# Patient Record
Sex: Female | Born: 1935 | Race: White | Hispanic: No | Smoking: Never smoker
Health system: Southern US, Community
[De-identification: ages and names within clinical notes are randomized; demographics above are authoritative.]

## PROBLEM LIST (undated history)

## (undated) DIAGNOSIS — K3184 Gastroparesis: Secondary | ICD-10-CM

## (undated) DIAGNOSIS — M797 Fibromyalgia: Secondary | ICD-10-CM

## (undated) DIAGNOSIS — K219 Gastro-esophageal reflux disease without esophagitis: Secondary | ICD-10-CM

## (undated) DIAGNOSIS — R0602 Shortness of breath: Secondary | ICD-10-CM

## (undated) DIAGNOSIS — R112 Nausea with vomiting, unspecified: Secondary | ICD-10-CM

## (undated) DIAGNOSIS — D369 Benign neoplasm, unspecified site: Secondary | ICD-10-CM

## (undated) DIAGNOSIS — C801 Malignant (primary) neoplasm, unspecified: Secondary | ICD-10-CM

## (undated) DIAGNOSIS — T7840XA Allergy, unspecified, initial encounter: Secondary | ICD-10-CM

## (undated) DIAGNOSIS — K449 Diaphragmatic hernia without obstruction or gangrene: Secondary | ICD-10-CM

## (undated) DIAGNOSIS — H269 Unspecified cataract: Secondary | ICD-10-CM

## (undated) DIAGNOSIS — K589 Irritable bowel syndrome without diarrhea: Secondary | ICD-10-CM

## (undated) DIAGNOSIS — Z9889 Other specified postprocedural states: Secondary | ICD-10-CM

## (undated) DIAGNOSIS — K635 Polyp of colon: Secondary | ICD-10-CM

## (undated) DIAGNOSIS — E039 Hypothyroidism, unspecified: Secondary | ICD-10-CM

## (undated) DIAGNOSIS — D509 Iron deficiency anemia, unspecified: Secondary | ICD-10-CM

## (undated) DIAGNOSIS — K222 Esophageal obstruction: Secondary | ICD-10-CM

## (undated) HISTORY — DX: Gastroparesis: K31.84

## (undated) HISTORY — DX: Polyp of colon: K63.5

## (undated) HISTORY — PX: EYE SURGERY: SHX253

## (undated) HISTORY — PX: APPENDECTOMY: SHX54

## (undated) HISTORY — DX: Iron deficiency anemia, unspecified: D50.9

## (undated) HISTORY — DX: Benign neoplasm, unspecified site: D36.9

## (undated) HISTORY — DX: Allergy, unspecified, initial encounter: T78.40XA

## (undated) HISTORY — PX: DILATION AND CURETTAGE OF UTERUS: SHX78

## (undated) HISTORY — PX: COLON SURGERY: SHX602

## (undated) HISTORY — DX: Irritable bowel syndrome, unspecified: K58.9

## (undated) HISTORY — DX: Fibromyalgia: M79.7

## (undated) HISTORY — PX: ARM SKIN LESION BIOPSY / EXCISION: SUR471

## (undated) HISTORY — DX: Gastro-esophageal reflux disease without esophagitis: K21.9

## (undated) HISTORY — DX: Diaphragmatic hernia without obstruction or gangrene: K44.9

## (undated) HISTORY — DX: Hypothyroidism, unspecified: E03.9

## (undated) HISTORY — DX: Esophageal obstruction: K22.2

## (undated) HISTORY — PX: OTHER SURGICAL HISTORY: SHX169

## (undated) HISTORY — PX: ABDOMINAL HYSTERECTOMY: SHX81

## (undated) HISTORY — DX: Unspecified cataract: H26.9

---

## 2002-05-26 ENCOUNTER — Ambulatory Visit (HOSPITAL_COMMUNITY): Admission: RE | Admit: 2002-05-26 | Discharge: 2002-05-26 | Payer: Self-pay | Admitting: Family Medicine

## 2002-05-26 ENCOUNTER — Encounter: Payer: Self-pay | Admitting: Family Medicine

## 2002-09-25 ENCOUNTER — Ambulatory Visit (HOSPITAL_COMMUNITY): Admission: RE | Admit: 2002-09-25 | Discharge: 2002-09-25 | Payer: Self-pay | Admitting: Internal Medicine

## 2002-09-25 HISTORY — PX: ESOPHAGOGASTRODUODENOSCOPY: SHX1529

## 2004-01-28 ENCOUNTER — Ambulatory Visit (HOSPITAL_COMMUNITY): Admission: RE | Admit: 2004-01-28 | Discharge: 2004-01-28 | Payer: Self-pay | Admitting: Internal Medicine

## 2004-01-28 HISTORY — PX: ESOPHAGOGASTRODUODENOSCOPY: SHX1529

## 2004-04-20 ENCOUNTER — Ambulatory Visit: Payer: Self-pay | Admitting: Gastroenterology

## 2004-06-16 ENCOUNTER — Ambulatory Visit (HOSPITAL_COMMUNITY): Admission: RE | Admit: 2004-06-16 | Discharge: 2004-06-16 | Payer: Self-pay | Admitting: Family Medicine

## 2004-08-17 ENCOUNTER — Ambulatory Visit: Payer: Self-pay | Admitting: Internal Medicine

## 2004-08-18 ENCOUNTER — Ambulatory Visit (HOSPITAL_COMMUNITY): Admission: RE | Admit: 2004-08-18 | Discharge: 2004-08-18 | Payer: Self-pay | Admitting: Internal Medicine

## 2004-08-18 IMAGING — RF DG ESOPHAGUS
15 of 21 series · 15 of 21 positions shown · non-contrast
Comparison: none

CLINICAL DATA: Dysphagia.

ESOPHAGRAM:
TECHNIQUE: Routine esophagram was performed under fluoroscopy.

[Series 1: run · 1 of 1 slices shown (1 of 15)]
[im 1/1]
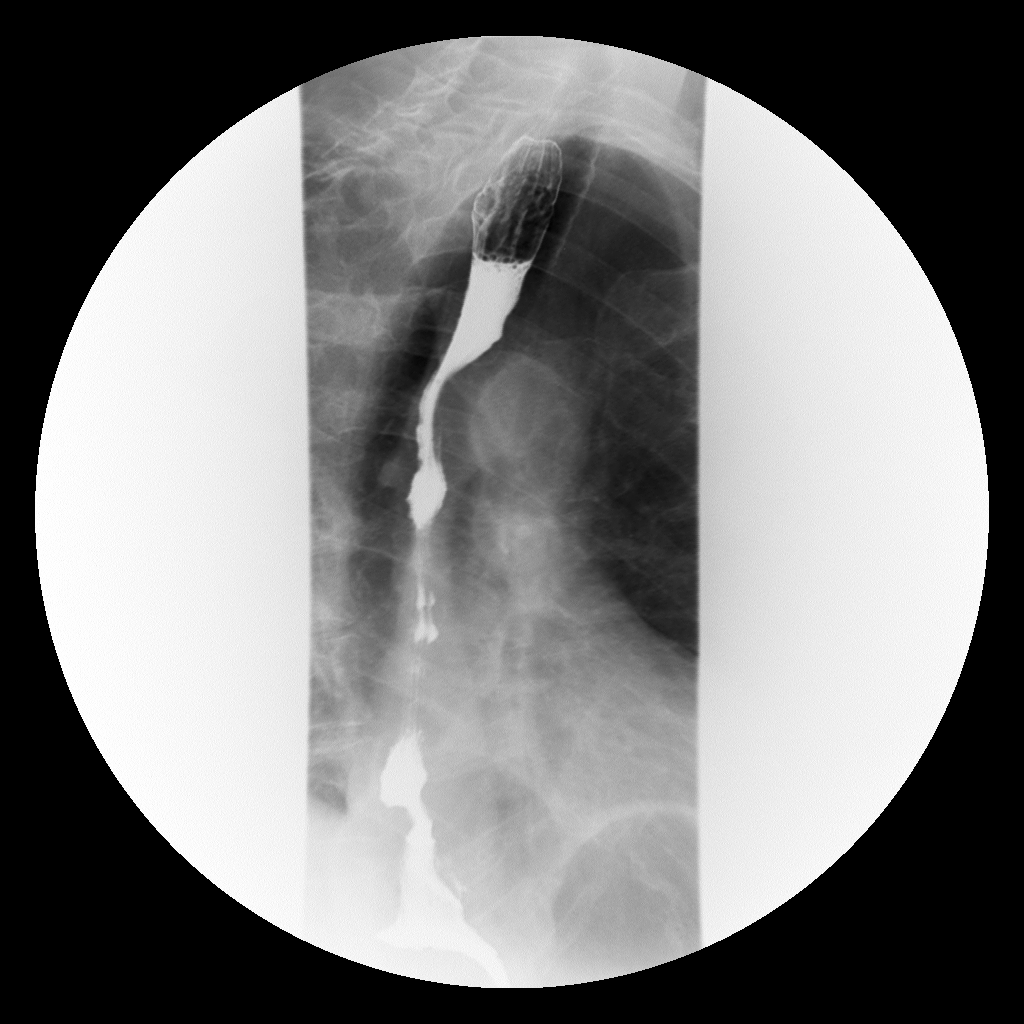

[Series 3: run · 1 of 1 slices shown (2 of 15)]
[im 1/1]
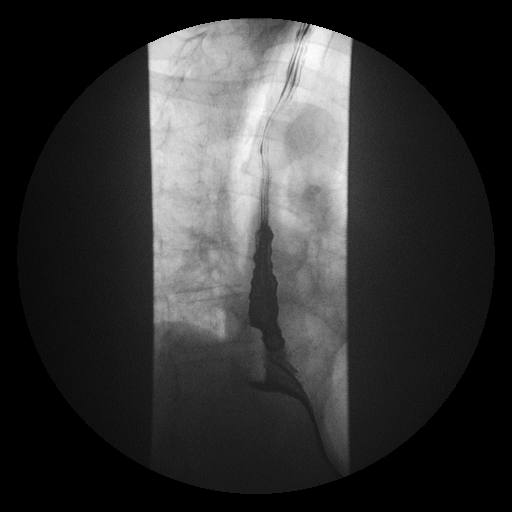

[Series 4: run · 1 of 1 slices shown (3 of 15)]
[im 1/1]
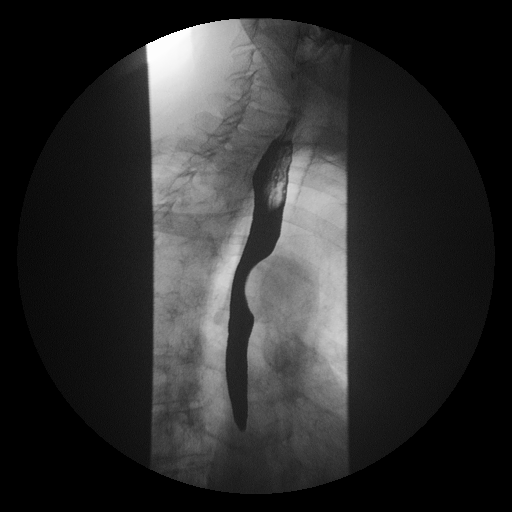

[Series 5: run · 1 of 1 slices shown (4 of 15)]
[im 1/1]
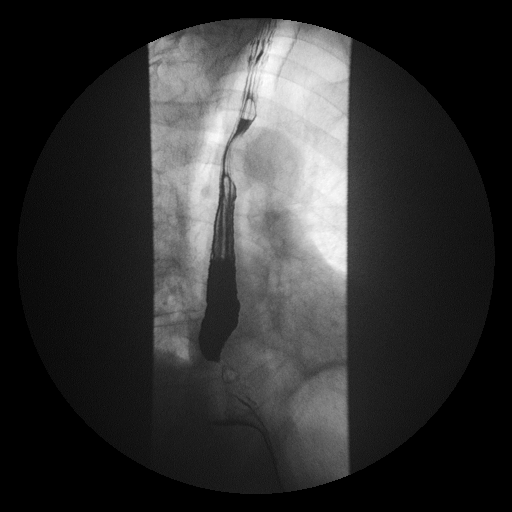

[Series 7: run · 1 of 1 slices shown (5 of 15)]
[im 1/1]
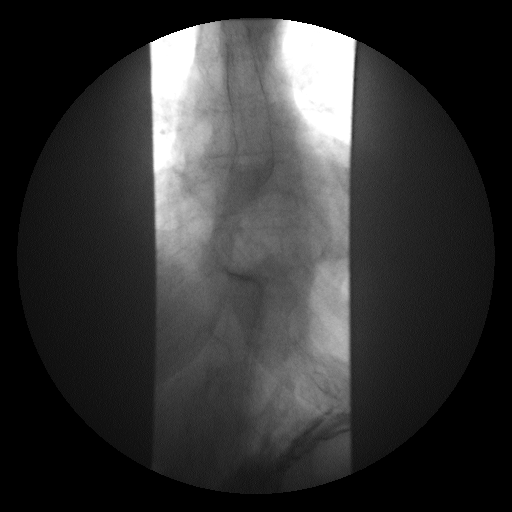

[Series 8: run · 1 of 1 slices shown (6 of 15)]
[im 1/1]
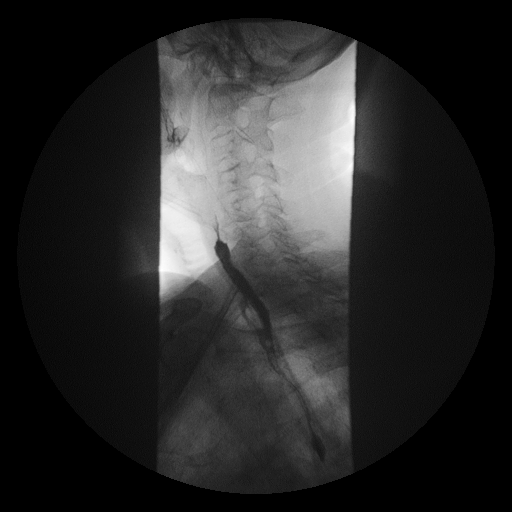

[Series 10: run · 1 of 1 slices shown (7 of 15)]
[im 1/1]
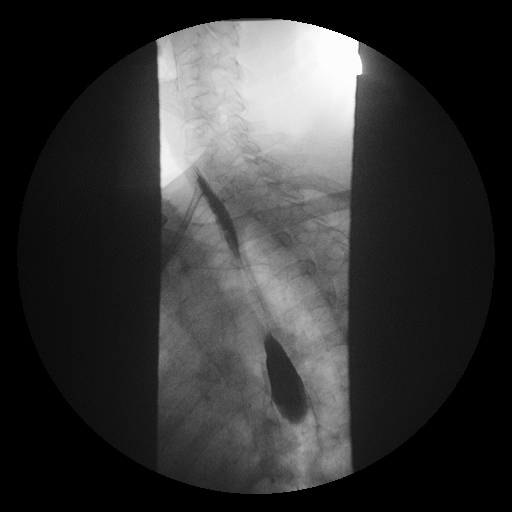

[Series 11: run · 1 of 1 slices shown (8 of 15)]
[im 1/1]
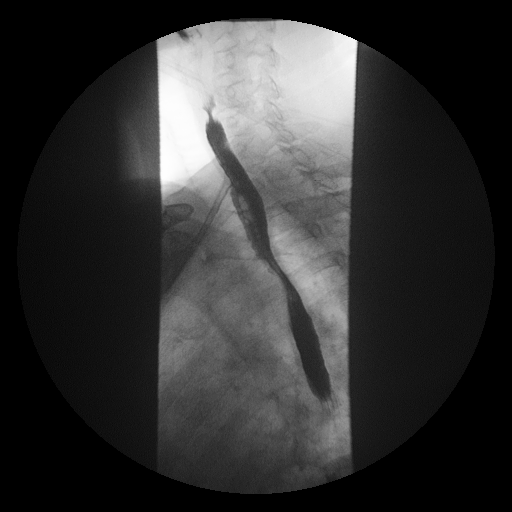

[Series 12: run · 1 of 1 slices shown (9 of 15)]
[im 1/1]
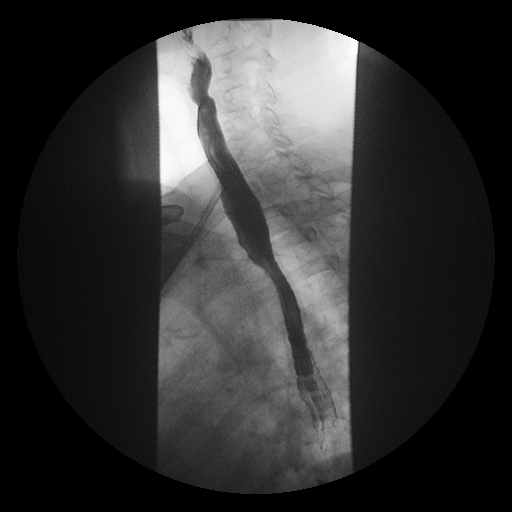

[Series 14: run · 1 of 1 slices shown (10 of 15)]
[im 1/1]
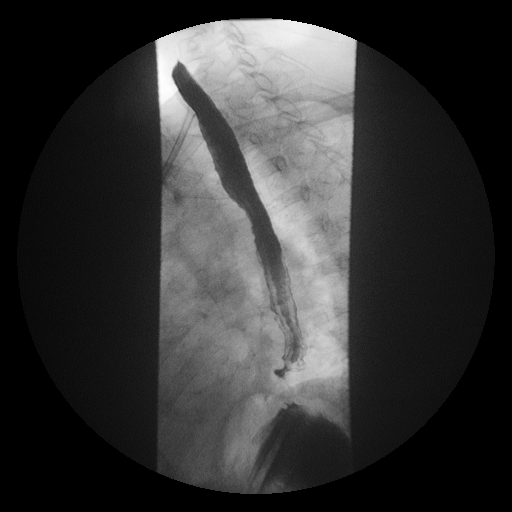

[Series 15: run · 1 of 1 slices shown (11 of 15)]
[im 1/1]
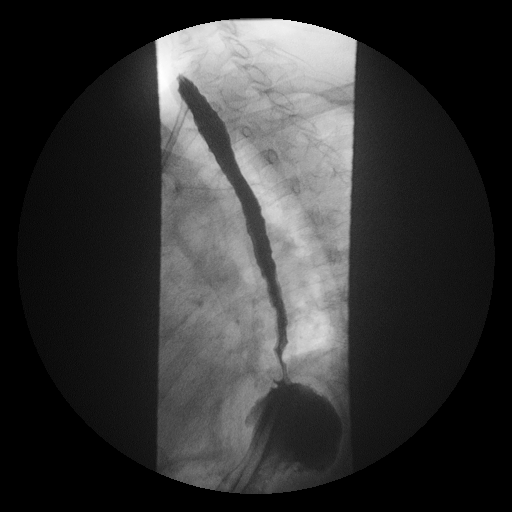

[Series 17: run · 1 of 1 slices shown (12 of 15)]
[im 1/1]
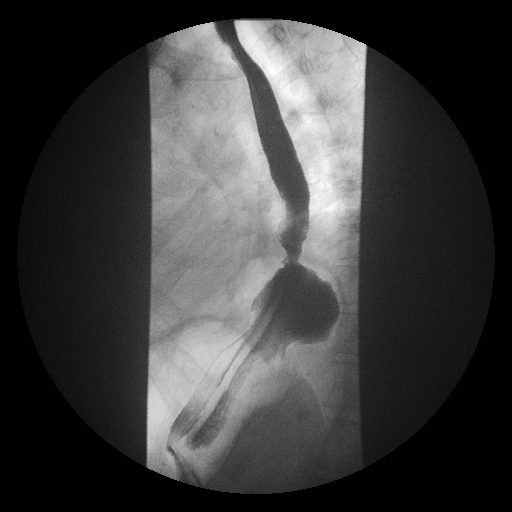

[Series 18: run · 1 of 1 slices shown (13 of 15)]
[im 1/1]
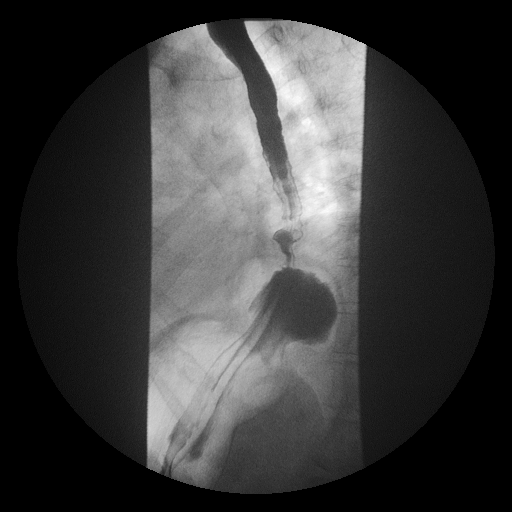

[Series 19: run · 1 of 1 slices shown (14 of 15)]
[im 1/1]
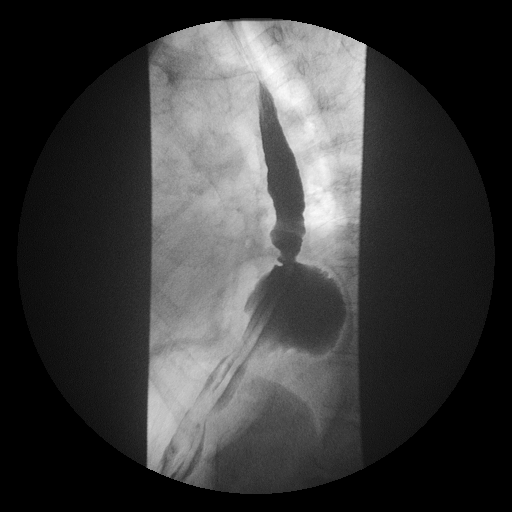

[Series 21: run · 1 of 1 slices shown (15 of 15)]
[im 1/1]
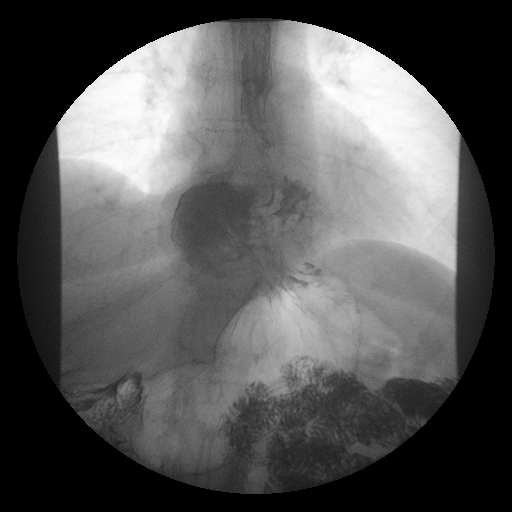

[15 of 21 positions shown; findings below may reference images not displayed]

FINDINGS: The examination was limited the fact that the patient would only
swallowing small amount of barium at a time. Normal primary and secondary
esophageal peristalsis with mild intermittent tertiary peristaltic activity in
the distal esophagus. No hypopharyngeal abnormalities seen. Moderate sized
sliding hiatal hernia. Mild, smooth narrowing at the gastroesophageal junction.
The patient did not drink enough barium to evaluate for gastroesophageal reflux.
There was reflux of air witnessed during the examination when the patient was in
a left posterior oblique position, supine on the table. No masses or ulcerations
seen. The patient swallowed a 12.5 mm in diameter barium tablet without
difficulty. This initially lodged at the gastroesophageal junction. This quickly
passed into the stomach with additional swallows of water.
IMPRESSION: 1. Moderate sized sliding hiatal hernia. 

2. Short segment, benign stricture at the gastroesophageal junction. The barium
tablet initially lodged at this location and subsequently passed normally into
the stomach. 

3. Mild intermittent tertiary peristaltic activity in the distal esophagus. 

4. Limited examination due to the fact that the patient would only swallow a
small amount of barium at a time.

## 2004-09-01 ENCOUNTER — Ambulatory Visit (HOSPITAL_COMMUNITY): Admission: RE | Admit: 2004-09-01 | Discharge: 2004-09-01 | Payer: Self-pay | Admitting: Internal Medicine

## 2004-09-01 ENCOUNTER — Ambulatory Visit: Payer: Self-pay | Admitting: Internal Medicine

## 2004-09-01 DIAGNOSIS — K449 Diaphragmatic hernia without obstruction or gangrene: Secondary | ICD-10-CM

## 2004-09-01 HISTORY — DX: Diaphragmatic hernia without obstruction or gangrene: K44.9

## 2004-09-01 HISTORY — PX: COLONOSCOPY: SHX174

## 2004-09-01 HISTORY — PX: ESOPHAGOGASTRODUODENOSCOPY: SHX1529

## 2004-09-21 ENCOUNTER — Ambulatory Visit: Payer: Self-pay | Admitting: Internal Medicine

## 2005-03-27 ENCOUNTER — Ambulatory Visit: Payer: Self-pay | Admitting: Internal Medicine

## 2005-08-17 ENCOUNTER — Ambulatory Visit: Payer: Self-pay | Admitting: Cardiology

## 2005-11-15 ENCOUNTER — Ambulatory Visit: Payer: Self-pay | Admitting: Internal Medicine

## 2005-12-14 ENCOUNTER — Ambulatory Visit (HOSPITAL_COMMUNITY): Admission: RE | Admit: 2005-12-14 | Discharge: 2005-12-14 | Payer: Self-pay | Admitting: Internal Medicine

## 2005-12-14 ENCOUNTER — Ambulatory Visit: Payer: Self-pay | Admitting: Internal Medicine

## 2005-12-14 HISTORY — PX: ESOPHAGOGASTRODUODENOSCOPY: SHX1529

## 2006-06-07 ENCOUNTER — Ambulatory Visit: Payer: Self-pay | Admitting: Endocrinology

## 2006-07-08 ENCOUNTER — Ambulatory Visit (HOSPITAL_COMMUNITY): Admission: RE | Admit: 2006-07-08 | Discharge: 2006-07-08 | Payer: Self-pay | Admitting: *Deleted

## 2006-07-09 ENCOUNTER — Ambulatory Visit (HOSPITAL_COMMUNITY): Admission: RE | Admit: 2006-07-09 | Discharge: 2006-07-09 | Payer: Self-pay | Admitting: *Deleted

## 2006-12-31 ENCOUNTER — Ambulatory Visit: Payer: Self-pay | Admitting: Internal Medicine

## 2007-01-03 HISTORY — PX: ESOPHAGOGASTRODUODENOSCOPY: SHX1529

## 2007-01-20 ENCOUNTER — Ambulatory Visit (HOSPITAL_COMMUNITY): Admission: RE | Admit: 2007-01-20 | Discharge: 2007-01-20 | Payer: Self-pay | Admitting: Internal Medicine

## 2007-01-20 ENCOUNTER — Ambulatory Visit: Payer: Self-pay | Admitting: Internal Medicine

## 2007-03-08 ENCOUNTER — Encounter: Payer: Self-pay | Admitting: *Deleted

## 2007-03-08 DIAGNOSIS — E039 Hypothyroidism, unspecified: Secondary | ICD-10-CM | POA: Insufficient documentation

## 2007-03-08 DIAGNOSIS — K219 Gastro-esophageal reflux disease without esophagitis: Secondary | ICD-10-CM | POA: Insufficient documentation

## 2007-03-08 DIAGNOSIS — J45909 Unspecified asthma, uncomplicated: Secondary | ICD-10-CM | POA: Insufficient documentation

## 2007-03-08 DIAGNOSIS — J309 Allergic rhinitis, unspecified: Secondary | ICD-10-CM | POA: Insufficient documentation

## 2008-04-02 ENCOUNTER — Ambulatory Visit (HOSPITAL_COMMUNITY): Admission: RE | Admit: 2008-04-02 | Discharge: 2008-04-02 | Payer: Self-pay | Admitting: Family Medicine

## 2008-07-22 ENCOUNTER — Ambulatory Visit: Payer: Self-pay | Admitting: Internal Medicine

## 2008-08-02 ENCOUNTER — Encounter (HOSPITAL_COMMUNITY): Admission: RE | Admit: 2008-08-02 | Discharge: 2008-09-01 | Payer: Self-pay | Admitting: Internal Medicine

## 2008-08-02 DIAGNOSIS — K3184 Gastroparesis: Secondary | ICD-10-CM

## 2008-08-02 HISTORY — DX: Gastroparesis: K31.84

## 2008-09-20 DIAGNOSIS — K3184 Gastroparesis: Secondary | ICD-10-CM

## 2008-09-20 DIAGNOSIS — R51 Headache: Secondary | ICD-10-CM

## 2008-09-20 DIAGNOSIS — R197 Diarrhea, unspecified: Secondary | ICD-10-CM

## 2008-09-20 DIAGNOSIS — R110 Nausea: Secondary | ICD-10-CM

## 2008-09-20 DIAGNOSIS — R1310 Dysphagia, unspecified: Secondary | ICD-10-CM

## 2008-09-20 DIAGNOSIS — R519 Headache, unspecified: Secondary | ICD-10-CM | POA: Insufficient documentation

## 2008-09-20 DIAGNOSIS — Z8719 Personal history of other diseases of the digestive system: Secondary | ICD-10-CM | POA: Insufficient documentation

## 2008-09-20 DIAGNOSIS — R498 Other voice and resonance disorders: Secondary | ICD-10-CM | POA: Insufficient documentation

## 2008-09-20 DIAGNOSIS — R1013 Epigastric pain: Secondary | ICD-10-CM

## 2008-10-14 ENCOUNTER — Ambulatory Visit: Payer: Self-pay | Admitting: Internal Medicine

## 2009-05-03 ENCOUNTER — Ambulatory Visit (HOSPITAL_COMMUNITY): Admission: RE | Admit: 2009-05-03 | Discharge: 2009-05-03 | Payer: Self-pay | Admitting: Family Medicine

## 2009-10-17 ENCOUNTER — Ambulatory Visit (HOSPITAL_COMMUNITY): Admission: RE | Admit: 2009-10-17 | Discharge: 2009-10-17 | Payer: Self-pay | Admitting: Ophthalmology

## 2010-04-05 ENCOUNTER — Ambulatory Visit: Payer: Self-pay | Admitting: Internal Medicine

## 2010-04-05 DIAGNOSIS — K449 Diaphragmatic hernia without obstruction or gangrene: Secondary | ICD-10-CM | POA: Insufficient documentation

## 2010-04-18 ENCOUNTER — Ambulatory Visit (HOSPITAL_COMMUNITY): Admission: RE | Admit: 2010-04-18 | Discharge: 2010-04-18 | Payer: Self-pay | Admitting: Internal Medicine

## 2010-04-18 ENCOUNTER — Ambulatory Visit: Payer: Self-pay | Admitting: Internal Medicine

## 2010-04-18 HISTORY — PX: ESOPHAGOGASTRODUODENOSCOPY: SHX1529

## 2010-07-03 ENCOUNTER — Other Ambulatory Visit: Payer: Self-pay

## 2010-07-03 DIAGNOSIS — Z139 Encounter for screening, unspecified: Secondary | ICD-10-CM

## 2010-07-04 ENCOUNTER — Ambulatory Visit (HOSPITAL_COMMUNITY): Admission: RE | Admit: 2010-07-04 | Payer: Self-pay | Source: Home / Self Care | Admitting: Family Medicine

## 2010-07-06 NOTE — Assessment & Plan Note (Signed)
Summary: DIFFICULTY SWALLOWING,SEVERE HEARTBURN,CONSULT FOR EGD/SS   Visit Type:  f/u  Chief Complaint:  difficulty swallowing.  History of Present Illness: Sabrina Morrison is here for f/u. She c/o recurrent dysphagia and severe heartburn. She was last seen in 5/10. At that time she had GES which showed slight delay in gastric emptying. She was tried on EES but shortly after developed left-sided weakness so the med was stopped. She was given domperidone but not clear of her response.  On Prevacid chronically. Recent abx for URI. C/O hoarseness, sinus swelling, coughing. Recent chest tightness. Difficulty swallowing for several months, especially meats. Sometimes pills get stuck. Feels like food is always sitting her her chest. Sleeps on pillow wedge due to nocturnal reflux. Lots of heartburn pp. Stool tests recently normal per patient. BM 3-4 small stools daily. No melena, brbpr. [facility] earlier this year with acute diarrhea. Health Dept involved but patient not sure source of her diarrhea, just knows it was food-borne.   Current Medications (verified): 1)  Synthroid 100 Mcg  Tabs (Levothyroxine Sodium) .... Take 1 By Mouth Qd 2)  [employer] Vitamin B-12 500 Mcg  Tabs (Cyanocobalamin) .... Take 1 By Mouth Qd 3)  Rolaids .... Take As Needed 4)  Ibuprofen Tabs .... Take As Needed 5)  Calcium 600 Mg Tabs (Calcium) .... Take 1 Tablet By Mouth Two Times A Day 6)  Fish Oil 1000 Mg Caps (Omega-3 Fatty Acids) .... Take 1 Tablet By Mouth Once A Day 7)  Ginger Root 550 Mg Caps (Ginger (Zingiber Officinalis)) .... Take 1 Capsule By Mouth Once A Day 8)  Acetaminophen .... Take As Needed 9)  Xopenex .... As Needed 10)  Maxair .... As Directed 11)  Lomotil 2.5-0.025 Mg Tabs (Diphenoxylate-Atropine) .... As Needed 12)  Prevacid 30 Mg Cpdr (Lansoprazole) .... Once Daily  Allergies (verified): 1)  ! Codeine 2)  ! Sulfa 3)  ! Nexium 4)  ! Avelox (Moxifloxacin Hcl) 5)  ! E.e.s. 400 (Erythromycin  Ethylsuccinate) 6)  ! Omeprazole (Omeprazole) 7)  ! Dexilant (Dexlansoprazole)  Past History:  Family History: Last updated: 10-15-2008 Father: deceased 59  MI Mother: deceased 84 heart failure Siblings: one half sister  Social History: Last updated: 10/15/08 Marital Status: widowed Children: 2 daughters Occupation: Publishing rights manager [employer]  Past Medical History: Allergic rhinitis Asthma GERD Hypothyroidism Mild gastroparesis EGD with Savary dilation,8/08,-->cork-screwed esophagus, prominent Schatki's ring, large hh  TCS, 3/06--> normal   Past Surgical History: Segmental colectomy with incidental appendectomy in [place], [place] for diverticulitis Hysterectomy D&C  Review of Systems General:  Denies fever, chills, sweats, anorexia, fatigue, weakness, and weight loss. Eyes:  Denies vision loss. ENT:  Complains of nasal congestion, hoarseness, and difficulty swallowing; denies sore throat. CV:  Denies chest pains, angina, palpitations, dyspnea on exertion, and peripheral edema. Resp:  Complains of wheezing; denies dyspnea at rest, dyspnea with exercise, cough, and sputum. GI:  See HPI. GU:  Denies urinary burning and blood in urine. MS:  Denies joint swelling and low back pain. Derm:  Denies rash and itching. Neuro:  Denies weakness, frequent headaches, memory loss, and confusion. Psych:  Denies depression and anxiety. Endo:  Denies unusual weight change. Heme:  Denies bruising and bleeding. Allergy:  Denies hives and rash.  Vital Signs:  Patient profile:   77 year old female Height:      65 inches Weight:      173 pounds BMI:     28.89 Temp:     97.9 degrees F oral Pulse rate:  68 / minute BP sitting:   132 / 80  (left arm) Cuff size:   regular  Vitals Entered By: Hendricks Limes LPN (April 05, 2010 2:50 PM)  Physical Exam  General:  Well developed, well nourished, no acute distress. Head:  Normocephalic and atraumatic. Eyes:  sclera nonicteric Mouth:   Oropharyngeal mucosa moist, pink.  No lesions, erythema or exudate.    Neck:  Supple; no masses or thyromegaly. Lungs:  Clear throughout to auscultation. Heart:  Regular rate and rhythm; no murmurs, rubs,  or bruits. Abdomen:  Mild epig tenderness. Normal BS. Soft. No HSM or masses. No abd bruit or hernia. Extremities:  No clubbing, cyanosis, edema or deformities noted. Neurologic:  Alert and  oriented x4;  grossly normal neurologically. Skin:  Intact without significant lesions or rashes. Cervical Nodes:  No significant cervical adenopathy. Psych:  Alert and cooperative. Normal mood and affect.  Impression & Recommendations:  Problem # 1:  OTHER DYSPHAGIA (ICD-787.29)  Recurrent dysphagia with h/o Schatki's rings requiring multiple dilations in the past. Last EGD/ED in 2008. Suspect recurrent ring. EGD/ED to be performed in near future.  Risks, alternatives, benefits including but not limited to risk of reaction to medications, bleeding, infection, and perforation addressed.  Patient voiced understanding and verbal consent obtained.   Patient reports having significant N/V for 2-3 days after last EGD. She request RX for antiemetic post-op and may consider antiemetic prior to sedation.   Orders: Est. Patient Level IV (82956)  Problem # 2:  GERD (ICD-530.81)  Significant pp and nocturnal reflux on Prevacid. Intolerant to most other PPIs. Seen at [facility] several years ago for consideration of anti-reflux surgery but pH study had poor correlation with symptoms and it was felt the probe may had migrated to the stomach causing the high acidic readings. Medical therapy was recommended instead. Mild gastroparesis, previously did not tolerate EES and was given RX for domperidone (but not sure of response). EGD to be performed in near future.  Risks, alternatives, benefits including but not limited to risk of reaction to medications, bleeding, infection, and perforation addressed.  Patient voiced  understanding and verbal consent obtained.   Orders: Est. Patient Level IV (21308)  Problem # 3:  HIATAL HERNIA (ICD-553.3)  H/O large hiatal hernia which is likely source of some of her symptoms of fullness in chest and reflux.   Orders: Est. Patient Level IV (65784)

## 2010-07-06 NOTE — Letter (Signed)
Summary: EGD/ED ORDER  EGD/ED ORDER   Imported By: Ave Filter 04/05/2010 15:38:07  _____________________________________________________________________  External Attachment:    Type:   Image     Comment:   External Document

## 2010-07-08 ENCOUNTER — Encounter: Payer: Self-pay | Admitting: Family Medicine

## 2010-07-17 ENCOUNTER — Ambulatory Visit (HOSPITAL_COMMUNITY)
Admission: RE | Admit: 2010-07-17 | Discharge: 2010-07-17 | Disposition: A | Discharge status: Home / Self Care | Payer: Medicare Other | Source: Ambulatory Visit | Attending: Family Medicine | Admitting: Family Medicine

## 2010-07-17 DIAGNOSIS — Z139 Encounter for screening, unspecified: Secondary | ICD-10-CM

## 2010-07-17 DIAGNOSIS — Z1231 Encounter for screening mammogram for malignant neoplasm of breast: Secondary | ICD-10-CM | POA: Insufficient documentation

## 2010-08-22 LAB — BASIC METABOLIC PANEL
Chloride: 105 mEq/L (ref 96–112)
GFR calc Af Amer: >60 mL/min (ref >60)
GFR calc non Af Amer: >60 mL/min (ref >60)
Potassium: 4.6 mEq/L (ref 3.5–5.1)
Sodium: 138 mEq/L (ref 135–145)

## 2010-08-22 LAB — HEMOGLOBIN AND HEMATOCRIT, BLOOD
HCT: 36.1 % (ref 36.0–46.0)
Hemoglobin: 12.4 g/dL (ref 12.0–15.0)

## 2010-10-17 NOTE — Op Note (Signed)
NAME:  DELANCEY, MORAES               ACCOUNT NO.:  [id number]   MEDICAL RECORD NO.:  [id number]          PATIENT TYPE:  AMB   LOCATION:  DAY                           FACILITY:  [facility]   PHYSICIAN:  R. Roetta Sessions, M.D. DATE OF BIRTH:  29-Dec-1930   DATE OF PROCEDURE:  01/20/2007  DATE OF DISCHARGE:                               OPERATIVE REPORT   PROCEDURE:  EGD with Savary dilation followed by biopsy, disruption of  Schatzki's ring.   INDICATIONS FOR PROCEDURE:  A 77 year old lady with recurrent esophageal  dysphagia secondary to __________  Schatzki's ring.  She has been over  to  [facility], they had really no additional ideas other than  periodic dilation.  She has recurrent dysphagia.  EGD is now being done  with plans for esophageal dilation. The potential risks, benefits and  alternatives have been reviewed, questions answered.  She is agreeable.  Please see documentation of the medical record.   PROCEDURE NOTE:  O2 saturation, blood pressure, pulse and respirations  were monitored throughout the entire procedure.   CONSCIOUS SEDATION:  Versed 6 mg IV, Demerol 75 mg IV, Cetacaine spray  for topical pharyngeal anesthesia.   INSTRUMENT:  Pentax video chip system.   FINDINGS:  Examination of the tubular esophagus revealed some  corkscrewing ring of the tubular esophagus.  The EG junction was  somewhat offset. A ring was seen but it was not a straight shot into the  EG junction. The mucosa otherwise appeared normal.   STOMACH:  The gastric cavity was empty and insufflated well with air.  A  thorough examination of the gastric mucosa including a retroflexed view  of the proximal stomach revealed a large hiatal hernia, otherwise the  mucosa appeared normal. Schatzki's ring could be well seen and  retroflexed. The pylorus patent, easily traversed. Examination of the  bulb and second portion revealed no abnormalities.   THERAPEUTIC/DIAGNOSTIC MANEUVERS PERFORMED:  I felt  it was no longer  safe to attempt blind passage of a Maloney dilators and subsequently I  got the savory guidewire advanced through the scope down into the antrum  and removed the scope under endoscopic control maintaining position of  the wire. Over the wire,  I railed an 18-mm savory over the tapered tip,  got to the oropharynx and I ran into acute resistance and the patient  required additional sedation a this time, she was biting the savory. I  also realized the guidewire had migrated out. I withdrew everything and  reintubated the stomach with the scope and advanced the wire into the  antrum and pulled it out under endoscopic control maintaining the wire  in the antrum.  Subsequently after she was sedated additionally was able  to pass sn 18-mm Savory with fairly good tolerance although she was  biting the [place] intermittently as it was very carefully passed. A look  back revealed no apparent complication related to the dilator.  There  was a small tear in the ring, seen best retroflexed from the stomach.  However it more or less remained intact.  Subsequently I utilized the  jumbo biopsy forceps and took two bites of the ring at opposite  locations with excellent disruption without apparent complication.  The  patient tolerated the procedure well and was reacted in endoscopy.   IMPRESSION:  Cork screwed esophagus, prominent Schatzki's ring status  post dilation and disruption as described above, large hiatal hernia,  otherwise normal stomach, D1, D2.   RECOMMENDATIONS:  1. Continue omeprazole 20 mg orally twice daily.  2. She is to let me know if she has any recurrent swallowing      difficulties. Tentatively plan to see her back in the office in 1      year.      Jonathon Bellows, M.D.  Electronically Signed     RMR/MEDQ  D:  01/20/2007  T:  01/20/2007  Job:  [id number]   cc:   Donzetta Sprung  Fax: [phone]

## 2010-10-17 NOTE — Assessment & Plan Note (Signed)
NAMEMarland Morrison  NAKESHIA, Morrison                CHART#:  [id number]   DATE:  07/22/2008                       DOB:  1930/09/11   HISTORY:  1. Gastroesophageal reflux disease.  2. Schatzki's ring.  3. Normal colonoscopy 2006.  4. IBS-D.   The patient has had a long history of refractory gastroesophageal reflux  disease.  Her Schatzki ring was last dilated in 2008 at the time of EGD.  She has done well with no dysphagia.  However, she continues to have  regurgitation after each meal.  She switched from omeprazole to Kapidex  60 mg orally daily for the past 2 months with some better control of her  reflux symptoms, although she does have regurgitation after each meal  and takes some Maalox and a GI cocktail daily, sometimes multiple times.  She has a known large hiatal hernia, seeing Dr. Lorin Picket over at [facility]  previously, but has held off on proceeding with repair of hiatal  hernia/antireflux surgery.  She has lost 8 pounds since she was last  here.  She asked if she needed to have another EGD.  She is not had any  melena or rectal bleeding.   The patient stated she had some nausea, vomiting, diarrhea following the  procedure and called here but never got response back.  I do not see any  documentation of that in her record however.   The patient tells me that she has seen Dr. Lucie Leather and he prescribed  Avelox.  She has had some asthma episodes in association with her  regurgitation and I wonder if there is a connection.   CURRENT MEDICATIONS:  See updated list.   ALLERGIES:  Sulfa, codeine, Nexium, Avelox.   PHYSICAL EXAMINATION:  Today she looks very well.  Weight 173, height 5  foot 5 inches, temperature 97.8, BP 158/80, pulse 60.  SKIN:  Warm and dry.  CHEST:  Lungs are clear to auscultation.  HEART:  Regular rate and rhythm without murmur, gallop or rub.  ABDOMEN:  Flat, positive bowel sounds, soft, nontender without  appreciable mass or organomegaly.   IMPRESSION:  This patient  is a pleasant 77 year old lady with  longstanding gastroesophageal reflux disease in the setting of a large  hiatal hernia.  She is describing postprandial regurgitation and she is  taking not only Kapidex but Maalox and a GI cocktail daily.  I do not  see where she has ever had a gastric emptying study.  Before doing any  anything else, I think we should look into the pathophysiology of what  is going on little further and assess gastric emptying before proceeding  in the direction of prokinetic therapy versus baclofen to bolster the  lower esophageal sphincter pressure.  At this point, we will go with a  gastric emptying study and size things up and then I will make further  recommendations.  She may end up at some point with impedance/pH study.  Telephone follow-up once I have that GES in hand for review.       Jonathon Bellows, M.D.  Electronically Signed     RMR/MEDQ  D:  07/22/2008  T:  07/22/2008  Job:  [id number]   cc:   Donzetta Sprung

## 2010-10-17 NOTE — H&P (Signed)
NAME:  Sabrina Morrison, Sabrina Morrison               ACCOUNT NO.:  [id number]   MEDICAL RECORD NO.:  [id number]          PATIENT TYPE:  AMB   LOCATION:  DAY                           FACILITY:  [facility]   PHYSICIAN:  R. Roetta Sessions, M.D. DATE OF BIRTH:  May 07, 1931   DATE OF ADMISSION:  DATE OF DISCHARGE:  LH                              HISTORY & PHYSICAL   CHIEF COMPLAINT:  Recurrent esophageal dysphagia.  History of  recalcitrant Schatzki's ring .   HISTORY OF PRESENT ILLNESS:  [name] is a pleasant 77 year old  woman with a well-known, well-documented Schatzki's ring, last dilated  with a 56-French Maloney dilator on December 15, 2006.  She was doing well  until recently when she developed recurrent esophageal dysphagia.  She  had been dilated multiple times and went in to see Dr. Alvester Morin over at [facility] to see if he had anything else to  offer, but he did not.  He recommended periodic dilations and acid  suppression therapy.  She has been on Prevacid 30 mg orally daily, which  works as well as any of the other multiple PPIs she has taken  previously.  She has not been on Zegerid.  She does take Rolaids p.r.n.  and occasionally takes a GI cocktail for night time symptoms.  Has not  had any melena or rectal bleeding.   PAST MEDICAL HISTORY:  1. Hypothyroidism on supplementation.  2. History of nasal allergies.  3. Gastroesophageal reflux disease.  4. History of segmental colectomy in [place], [place] for      diverticulitis previously.  5. History of hiatal hernia.  6. Hysterectomy.  7. D&C.   CURRENT MEDICATIONS:  1. Detrol LA 4 mg daily.  2. Lovenox p.r.n.  3. Synthroid 120 mcg daily.  4. Omeprazole 20 mg orally b.i.d.  5. Rolaids p.r.n.  6. Nitrofurantoin p.r.n.   ALLERGIES:  1. SULFA.  2. CODEINE.  3. NEXIUM.   FAMILY HISTORY:  Negative for chronic GI or liver disease.  The patient  is widowed.  Her husband was a former Psychologist, counselling.  She  has  2 daughters.  She is retired.  No tobacco or alcohol abuse.   REVIEW OF SYSTEMS:  No chest pain, no dyspnea on exertion.  Weight is  down 4 pounds.   PHYSICAL EXAMINATION:  GENERAL:  A pleasant 77 year old lady resting  comfortably.  VITAL SIGNS:  Weight 169.  Height 5 feet, 5 inches.  Temperature 97.6,  blood pressure 150/80, pulse 64.  CHEST:  Lungs are clear to auscultation.  CARDIAC:  Occasional ectopic beat with a 2/6 systolic ejection murmur in  the left sternal border.  ABDOMEN:  Flat, positive bowel sounds, soft, nontender, without  appreciable mass or organomegaly.  EXTREMITIES:  Without edema.   IMPRESSION:  [name] is a pleasant 77 year old lady with  recurrent esophageal dysphagia in the setting of a well-documented  Schatzki's ring.  This ring is somewhat recalcitrant and requires  relatively frequent dilations.  The best approach in this setting is to  offer her another dilation at this  point in time.  The potential risks  and benefits of the procedure have been reviewed and questions answered.  She is agreeable.  Will plan to perform EGD and esophageal dilation as  the most appropriate in the near future.  Further recommendations to  follow.   ADDENDUM:  She was dilated up to a 58-French Mahoney dilator back on  September 01, 2004.      Jonathon Bellows, M.D.  Electronically Signed     RMR/MEDQ  D:  12/31/2006  T:  01/01/2007  Job:  [id number]   cc:   Donzetta Sprung  Fax: [phone]

## 2010-10-20 NOTE — Op Note (Signed)
NAME:  Sabrina Morrison, Sabrina Morrison                         ACCOUNT NO.:  [id number]   MEDICAL RECORD NO.:  [id number]                   PATIENT TYPE:  AMB   LOCATION:  DAY                                  FACILITY:  [facility]   PHYSICIAN:  R. Roetta Sessions, M.D.              DATE OF BIRTH:  [birth year]   DATE OF PROCEDURE:  01/28/2004  DATE OF DISCHARGE:                                 OPERATIVE REPORT   INDICATIONS FOR PROCEDURE:  The patient is 78 year old lady with a known  Schatzki's ring, history of esophageal dysphasia last dilated September 25, 2003  with a 20-mm TTS balloon. She had reported improvement in her dysphagia  until recently.  It now is recurrent, and EGD is now being done with plans  for esophageal dilation. The approach of EGD has been discussed with the  patient. Potential risks, benefits, and alternatives have been reviewed.  Questions answered. She is agreeable. Please see my dictated H&P for more  information.   PROCEDURE:  O2 saturation, blood pressure, pulse, and respirations were  monitored throughout the entire procedure. Conscious sedation with Versed 4  mg IV and Demerol 100 mg IV in divided doses.   INSTRUMENT:  Olympus video chip system.   FINDINGS:  Examination of the tubular esophagus revealed again somewhat  muscular distal esophageal ring. It was more of a straight shot to the EG  junction than noted previously. Esophageal mucosa appeared normal.   Stomach:  The gastric cavity was empty and insufflated well with air.  Thorough examination of the gastric mucosal including reflection to view the  proximal stomach and esophagogastric junction demonstrated relatively large  hiatal hernia. Otherwise gastric mucosa appeared normal. Pylorus patent and  easily transversed. Examination of bulb and second portion revealed no  abnormalities.   THERAPY/DIAGNOSTIC MANEUVERS:  A 56-French Maloney dilator was passed and  then dilated without apparent complication. The patient  tolerated the  procedure well and was reactive.   ENDOSCOPY IMPRESSION:  1. Schatzki's ring status post dilation as described above. Otherwise normal     esophagus.  2. Large hiatal hernia. Otherwise normal stomach.  3. Normal D1 and D2.   RECOMMENDATIONS:  1. Ms. Grothe is to continue her Aciphex.  2. If she has recurrent esophageal dysphagia, would consider using a large     bore Savory dilator under fluoroscopy.  3. I have asked this nice lady to come back and see [place] in 3 months to see     how she is doing.      ___________________________________________                                            Jonathon Bellows, M.D.   RMR/MEDQ  D:  01/28/2004  T:  01/28/2004  Job:  [id number]   cc:  Donzetta Sprung  [address]  [place]  [place] [address]  Fax: [phone]   Roselyn Judie Petit. Willa Rough, M.D.  [address]  [place] [address]  Fax: [phone]

## 2010-10-20 NOTE — Consult Note (Signed)
[facility]                          ENDOCRINOLOGY CONSULTATION   Sabrina Morrison, Sabrina Morrison                      MRN:          [id number]  DATE:06/07/2006                            DOB:          08-27-1930    REFERRING PHYSICIAN:  Donzetta Sprung   REASON FOR REFERRAL:  Hypothyroidism.   HISTORY OF PRESENT ILLNESS:  A 77 year old woman who states that in 1980  she had a thyroidectomy for suspicious nodules.  The nodules proved to  be benign on pathology.  She states she has been on Synthroid since  then, taking 100 mcg a day for some years now.  Several months ago, she  developed a symptom complex of diarrhea, arthralgias, nausea, a slight  diffuse non-pulsatile headache, as well as some flushing and fatigue.  Changing thyroid hormone brand did not help.  She states that Dr. Reuel Boom  on a trial basis decreased her Synthroid to 50 mcg a day and she did not  feel any better and her TSH increased.  She also felt tired and these  symptoms resolved when it was increased back to 100 mcg a day, but the  other symptoms persist.   PAST MEDICAL HISTORY:  1. Mild asthma.  2. Allergic rhinitis.  3. GERD.   SOCIAL HISTORY:  She is widowed for many years.  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   FAMILY HISTORY:  Negative for thyroid disease.   REVIEW OF SYSTEMS:  She has a few muscle cramps but she denies fever and  she denies any change in her weight.   PHYSICAL EXAMINATION:  Blood pressure 152/71, heart rate 61, temperature  97.4, the weight is 164.  GENERAL:  No distress.  SKIN:  Normal texture and temperature, not diaphoretic, no rash, no cafe-  au-lait spot.  HEENT:  No proptosis, no periorbital swelling.  The skin of the face  does not appear flushed today.  NECK:  Supple, no goiter.  CHEST:  Clear to auscultation.  CARDIOVASCULAR:  No JVD.  No edema.  Regular rate and rhythm.  No  murmur.  Pedal pulses are intact.  ABDOMEN:   Soft, nontender, no hepatosplenomegaly, no mass.  NEUROLOGIC:  Alert and oriented.  Does not appear anxious nor depressed.  There is  a minimal postural tremor present.   LABORATORY DATA:  Forwarded by Dr. Reuel Boom:  On February 27, 2006, TSH  is 0.8.   IMPRESSION:  1. Chronic hypothyroidism due to 1980 surgery.  She is euthyroid on      replacement.  2. Muscle cramps, uncertain etiology.  3. Symptom complex of headache and flushing in a patient with      hypertension noted today.   PLAN:  1. Check parathyroid hormone.  2. Twenty-four hour urine for catecholamines and Metanephrines.  3. I have advised her to continue her Synthroid as prescribed by Dr.      Reuel Boom.  I have told her her symptoms are of non-thyroidal      etiology.     Sean A. Everardo All, MD  Electronically Signed    SAE/MedQ  DD:  06/09/2006  DT: 06/09/2006  Job #: [id number]   cc:   Donzetta Sprung

## 2010-10-20 NOTE — H&P (Signed)
NAME:  Sabrina Morrison, Sabrina Morrison               ACCOUNT NO.:  [id number]   MEDICAL RECORD NO.:  [id number]           PATIENT TYPE:  AMB   LOCATION:                                FACILITY:  [facility]   PHYSICIAN:  R. Roetta Sessions, M.D. DATE OF BIRTH:  12/03/1930   DATE OF ADMISSION:  11/15/2005  DATE OF DISCHARGE:  LH                                HISTORY & PHYSICAL   CHIEF COMPLAINT:  Esophageal dysphagia and gastroesophageal reflux disease.   HISTORY OF PRESENT ILLNESS:  Sabrina Morrison is a pleasant 77 year old  Caucasian female with long-standing gastroesophageal reflux disease and  known Schatzki's ring now with recurrent esophageal dysphagia.  This lady  last underwent EGD by me on January 28, 2004, which demonstrated a Schatzki's  ring.  She was dilated with a 56 [place] Maloney dilator.  She had a large  hiatal hernia.  The remainder of her examination was normal.  This lady has  had a multitude of difficulties with the whole spectrum of proton pump  inhibitor agents with various side-effects necessitating cessation of all  proton pump inhibitor therapies.  I sent this lady over to [facility] to be  evaluated back in 2006 for potential anti-reflux surgery.  She saw Drs.  Westcott and Lorenda Peck.  A pH probe study demonstrated significant reflux in  spite of PPI therapy at that time but a poor correlation in symptoms.  She  saw Dr. Lorin Picket who felt it would be best for her to be continued on  medical therapy as there was a somewhat poor correlation with symptoms and  reflux and the question was raised as to whether or not the probe had  migrated into her stomach or not during the study, because there was  significant reflux noted and she does have a large hiatal hernia.  At any  rate, [name] had been getting along well with various combinations of GI  cocktail, Pepto-Bismol, and Tums along the way.  A prior colonoscopy  demonstrated a normal rectum and colon.  She has not had any melena or  rectal  bleeding.  She does not drink alcohol or use tobacco.  Her weight is  up to 3 1/2 pounds and she was seen on March 27, 2005.   PAST MEDICAL HISTORY:  Significant for thyroid disease status post  thyroidectomy on Synthroid.  History of nasal allergies.  Gastroesophageal  reflux disease.  History of segmental colonic resection for diverticulitis  in [place], [place] back in 1993.  History of hiatal hernia.  History of  Schatzki's ring status post dilation.  Status post hysterectomy and D&C.   MEDICATIONS:  Compazine 5 mg p.r.n. occasional nausea, Synthroid 100 mcg  daily, GI cocktail 1-2 ounces daily, Maalox p.r.n., Pepto-Bismol p.r.n.,  Tums p.r.n., Nasonex daily, Asmanex.   ALLERGIES:  SULFA, CODEINE, NEXIUM, PREVACID, PRILOSEC, ACIPHEX, intolerant,  with various side-effects of abdominal pain, nausea, headache.   FAMILY HISTORY:  There is no chronic GI or liver disease.   SOCIAL HISTORY:  The patient is widowed.  Her husband was a former Barista.  She  has two daughters.  She is retired.  No tobacco or  alcohol.   REVIEW OF SYSTEMS:  No chest pain or dyspnea on exertion.  No fever, chills.  No change in weight.  No night sweats.   PHYSICAL EXAMINATION:  GENERAL:  Pleasant 77 year old lady resting comfortably.  VITAL SIGNS:  Weight 173.5, height 5 feet 5 inches, temperature 97.8, blood  pressure 158/80, pulse 64.  SKIN:  Warm and dry.  CHEST:  Lungs are clear to auscultation.  HEART:  Regular rate and rhythm without murmurs, gallops, and rubs.  ABDOMEN:  Nondistended, positive bowel sounds, soft, nontender, without  appreciable mass or organomegaly.  EXTREMITIES:  No edema.   IMPRESSION:  [name] is a 77 year old lady with esophageal dysphagia in  the setting of known Schatzki's ring and gastroesophageal reflux disease.  She has really been intolerant to a whole class of PPI agents.  She is  getting by now with agents as noted above.   RECOMMENDATIONS:   I have offered Sabrina Morrison EGD with esophageal dilation as  appropriate to [facility].  The potential risks, benefits, and  alternatives have been reviewed and questions answered.  She is agreeable.  I will also ask her to try Pepcid AC or Pepcid complete on a daily basis to  combat her reflux symptoms.  Hopefully, she will be able to tolerate one of  those agents.  I will make further recommendations in the very near future  after endoscopic evaluation has been performed.      Jonathon Bellows, M.D.  Electronically Signed     RMR/MEDQ  D:  11/15/2005  T:  11/15/2005  Job:  [id number]   cc:   Meliton Rattan. Willa Rough, M.D.  Fax: [phone]   Donzetta Sprung  Fax: [phone]

## 2010-10-20 NOTE — H&P (Signed)
NAME:  Sabrina Morrison, Sabrina Morrison                         ACCOUNT NO.:  [id number]   MEDICAL RECORD NO.:  [id number]                   PATIENT TYPE:   LOCATION:                                       FACILITY:   PHYSICIAN:  R. Roetta Sessions, M.D.              DATE OF BIRTH:  Jun 03, 1931   DATE OF ADMISSION:  DATE OF DISCHARGE:                                HISTORY & PHYSICAL   CHIEF COMPLAINT:  Problems swallowing.   HISTORY OF PRESENT ILLNESS:  The patient is here for a follow-up visit.  She  was last seen in October 2003 for follow-up of gastroesophageal reflux  symptoms.  She has a long history of difficult-to-control acid reflux  symptoms as well as laryngopharyngeal symptoms.  She is followed by Dr.  Lucie Leather.  She requires multiple medications in order to control her reflux  including pro motility agents.  She previously had an Schatzki ring status  post dilatation in April 2002.  She states she has done very well since that  time up until recently.  She started noticing that food goes down very slow.  Last week she had rice and chicken become lodged in her esophagus.  She had  to vomit in order to release this.  Denies any odynophagia although she did  have pain when this occurred.  Denies any nausea or vomiting, abdominal  pain, melena, or rectal bleeding.  She continues to have occasional post  prandial loose stools due to her IBS.  She continues to have some heartburn  symptoms on occasion.  She is on cimetidine 800 mg q.a.m., Prevacid 30 mg  b.i.d., Reglan 10 mg b.i.d.  She occasionally uses GI cocktail.  She is  following antireflux measures.  She has the head of the bed elevated as well  as sleeps on a pillow wedge.   She was going to schedule a colonoscopy last December; however, had to  postpone this due to lung infection at that time.  She plans on doing this  some time this year when has the occasion.   CURRENT MEDICATIONS:  1. Cimetidine 400 mg two q.a.m.  2. Prevacid 30  mg b.i.d.  3. Levbid 0.375 mg p.r.n.  4. Compazine 5 mg p.r.n.  5. Synthroid 100 mcg daily.  6. Tessalon Perles p.r.n.  7. GI cocktail p.r.n.  8. Lomotil p.r.n.  9. Multivitamin daily.  10.      Vitamin E daily.  11.      Vitamin C 500 mg daily.  12.      Aspirin 81 mg daily.  13.      Calcium 600 mg daily.  14.      Tylenol p.r.n.  15.      Pulmicort inhaler two puffs daily.  16.      Reglan 10 mg b.i.d.  17.      Doxycycline 100 mg b.i.d.  18.  Pain reliever p.r.n.   ALLERGIES:  SULFA, CODEINE, and NEXIUM.   PAST MEDICAL HISTORY:  1. Status post thyroidectomy, on Synthroid.  2. History of nasal congestion.  3. IBS.  4. Gastroesophageal reflux disease with LPR.  5. History of Schatzki ring status post dilatation as outlined above.  6. Moderate sized hiatal hernia noted at the time of EGD in April 2002.  7. History of segmental colonic resection for diverticulitis in [place],     [place] in 1993.  8. Hysterectomy, D&C, and thyroidectomy.   FAMILY HISTORY:  Negative for chronic GI illnesses or colorectal cancer.   SOCIAL HISTORY:  She is widowed.  She has two daughters.  She is retired.  She does not use tobacco or alcohol.  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   REVIEW OF SYSTEMS:  Please see HPI for GI.  GENERAL:  Denies any weight  loss.  CARDIOPULMONARY:  Denies any chest pain or shortness of breath.   PHYSICAL EXAMINATION:  VITAL SIGNS:  Weight 190.5, stable.  Blood pressure  130/80, pulse 74.  GENERAL:  Pleasant, well-nourished, well-developed Caucasian female in no  acute distress.  SKIN:  Warm and dry, no jaundice.  HEENT:  Conjunctivae are pink, sclerae nonicteric.  Oropharyngeal mucosa  moist and pink.  CHEST:  Lungs clear to auscultation.  CARDIAC:  Reveals regular rate and rhythm, normal S1, S2.  No murmurs, rubs,  or gallops.  ABDOMEN:  Positive bowel sounds, soft, nontender, nondistended.   No  organomegaly or masses.  EXTREMITIES:  No edema.   IMPRESSION:  1. Chronic gastroesophageal reflux symptoms which have been very difficult     to control.  Overall she seems to be doing very well but requires     Prevacid b.i.d. as well as cimetidine 400 mg two tablets daily and Reglan     10 mg b.i.d.  She recently developed what sounds like an esophageal food     impaction which she was able to release on her own.  She does have a     history of Schatzki ring and may have developed another ring or even     esophageal stricture due to acid reflux.  2. Typical irritable bowel symptoms which occur occasionally.  She is doing     fairly well on hyoscyamine.   She is due for a screening colonoscopy and plans on scheduling this some  time this year.   PLAN:  1. EGD with dilatation in the near future.  2.     She will continue current regimen for now.  3. She will call to schedule a screening colonoscopy when she is ready.  4. Further recommendations to follow.     Tana Coast, Pricilla Larsson, M.D.    LL/MEDQ  D:  08/26/2002  T:  08/26/2002  Job:  [id number]   cc:   Donzetta Sprung  [address]  [place]  [place] [address]  Fax: [phone]

## 2010-10-20 NOTE — Op Note (Signed)
NAME:  Sabrina Morrison, Sabrina Morrison               ACCOUNT NO.:  [id number]   MEDICAL RECORD NO.:  [id number]          PATIENT TYPE:  AMB   LOCATION:  DAY                           FACILITY:  [facility]   PHYSICIAN:  R. Roetta Sessions, M.D. DATE OF BIRTH:  19-Mar-1931   DATE OF PROCEDURE:  09/01/2004  DATE OF DISCHARGE:                                 OPERATIVE REPORT   PROCEDURE:  Esophagogastroduodenoscopy with Elease Hashimoto dilation, followed by  screening colonoscopy.   INDICATION FOR PROCEDURE:  The patient is a 77 year old lady with recurrent  esophageal dysphagia, history of a known Schatzki's ring, a barium pill  esophagogram demonstrated Schatzki's ring impeding passage of the barium  pill.  She has not had colonoscopy in a good 20 years.  She has no lower GI  tract symptoms, no family history of colorectal neoplasia.  EGD with [facility]  dilation as appropriate followed by screening colonoscopy are now being  done.  This approach has been discussed with the patient at length,  potential risks, benefits, and alternatives have been reviewed, questions  answered.  She is agreeable.  Please see the documentation in the medical  record for more information.   PROCEDURE NOTE:  O2 saturation, blood pressure, pulse, and respiration were  monitored throughout the entire procedure.   CONSCIOUS SEDATION:  Versed 5 mg IV, Demerol 100 mg IV in divided doses.   INSTRUMENT USED:  Olympus video chip system.   FINDINGS:  EGD:  Examination of the tubular esophagus again revealed a  Schatzki's ring.  The mucosa otherwise appeared normal.  The EG junction was  easily traversed.   Stomach:  The gastric cavity was empty and insufflated well with air.  A  thorough examination of the gastric mucosa including retroflexion in the  proximal stomach and esophagogastric junction demonstrated a moderately  large hiatal hernia.  Otherwise the gastric mucosa appeared normal.  The  pylorus was patent and easily traversed.   Examination of the bulb and second  portion revealed no abnormalities.   Therapeutic/diagnostic maneuvers performed:  A 56 French Maloney dilator was  passed fully with ease.  Subsequently a 29 French Maloney dilator was passed  fully with ease.  A look back revealed the ring had been ruptured without  apparent complication.  The patient tolerated the procedure well and was  prepared for colonoscopy.   Digital rectal exam revealed no abnormalities.  Endoscopic findings:  Prep  was good.   Rectum:  Examination of the rectal mucosa including retroflexed view of the  anal verge revealed no abnormalities.   Colon:  The colonic mucosa was surveyed from the rectosigmoid junction  through the left, transverse and right colon to the area of the appendiceal  orifice, ileocecal valve and cecum.  These structures were well-seen and  photographed for the record.  From this level the scope was slowly withdrawn  and all previously-mentioned mucosal surfaces were again seen.  The colonic  mucosa appeared normal.  The patient tolerated both procedures well, was  reacted in endoscopy.   IMPRESSION:  Esophagogastroduodenoscopy:  1.  Schatzki's ring, otherwise normal esophagus status  post dilation as      described above.  2.  Moderately large hiatal hernia, otherwise normal stomach, normal D1, D2.   Colonoscopy findings:  1.  Normal rectum.  2.  Normal colon.   RECOMMENDATIONS:  1.  Repeat screening colonoscopy 10 years.  2.  Continue Aciphex 20 mg orally daily.  3.  Ms. Haggard is to call if she has any future GI problems.      RMR/MEDQ  D:  09/01/2004  T:  09/01/2004  Job:  [id number]   cc:   Meliton Rattan. Willa Rough, M.D.  [address]  [place] [address]  Fax: [phone]   Donzetta Sprung  [address]  [place]  [place] [address]  Fax: [phone]

## 2010-10-20 NOTE — H&P (Signed)
NAME:  Sabrina Morrison, Sabrina Morrison                           ACCOUNT NO.:  [id number]   MEDICAL RECORD NO.:  [id number]                   PATIENT TYPE:   LOCATION:                                       FACILITY:   PHYSICIAN:  R. Roetta Sessions, M.D.              DATE OF BIRTH:  05-Nov-1930   DATE OF ADMISSION:  DATE OF DISCHARGE:                                HISTORY & PHYSICAL  (CORRECTED COPY)   CHIEF COMPLAINT:  Recurrent esophageal dysphagia, history of Schatzki's  ring.   The patient is a 77 year old lady with an element of gastroesophageal reflux  disease and atypical chest pain, history of reactive airways disease  followed by Dr. Willa Rough who now returns stating that after being on Aciphex 20  mg orally b.i.d. and increasing her Pulmicort to four puffs a day that her  chest pain is much better. She was seen by Dr. Willa Rough, and I spoke to her on  the telephone a couple of weeks ago, and we decided to get her a change out  from Prevacid to Aciphex. Although her chest symptoms are much better, she  now complains of recurrent esophageal dysphagia to solids. This lady had  dysphagia back in April of 2004 for which she underwent EGD at [facility]. She was found to have a muscular ring in the EG junction, and the  esophagus was somewhat offset at this level. Consequently, a TTS balloon was  used to dilate this ring. She derived significant improvement in her  dysphagia symptoms until just recently. Has not had any melena or rectal  bleeding. In addition to taking Aciphex 20 mg orally b.i.d. she does take a  GI cocktail more than once daily and some Tagamet from time for her chest  symptoms, but again as stated above, her chest pain overall is improved  significantly.   PAST MEDICAL HISTORY:  1. Status post thyroidectomy on Synthroid.  2. History of nasal congestion.  3. IBS.  4. Gastroesophageal reflux disease.  5. History of segmental colonic resection for diverticulitis in [place] in 1993.  6. History of moderate sized hiatal hernia.  7. History of Schatzki's ring status post dilatation.  8. History of hysterectomy, D&C, and thyroidectomy.   CURRENT MEDICATIONS:  1. Pulmicort.  2. Aciphex 20 mg orally b.i.d.  3. Synthroid.  4. Aspirin 81 mg daily.  5. Multivitamin supplement.  6. GI cocktail p.r.n.   ALLERGIES:  SULFA, CODEINE, NEXIUM.   FAMILY HISTORY:  Negative for chronic GI or liver disease.   SOCIAL HISTORY:  The patient is widowed. She has two daughters. She is  retired. Does not use tobacco or alcohol. 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   REVIEW OF SYSTEMS:  Please see history of present illness. She has not had  any chest pain or shortness of breath today or  in the past couple of weeks.  No fever or chills.   PHYSICAL EXAMINATION:  GENERAL:  Pleasant 77 year old lady resting  comfortably. Weight 188.5, BP 120/80, pulse 64.  SKIN:  Warm and dry.  HEENT:  No sclerae icterus. JVD is not prominent.  CHEST:  Lungs are clear to auscultation.  CARDIAC:  Regular rate and rhythm without murmurs, gallops, or rubs.  BREASTS:  Deferred.  ABDOMEN:  Nondistended, positive bowel sounds, soft, nontender, without  appreciable masses or organomegaly.  EXTREMITIES:  No edema.   IMPRESSION:  The patient is a pleasant 77 year old lady with recurrent  esophageal dysphagia. She has a known history of Schatzki's ring. She  derives significant improvement after esophageal dilation April 2004. She  really needs to have another EGD with dilation at this point in time. She  really needs to have another EGD with dilation at this point in time.  Potential risk, benefits, and alternatives of this approach were discussed  with the patient, and she is agreeable. Her atypical chest pain has improved  significantly, at least temporarily, related to switching her out from  Prevacid to Aciphex 20 mg orally b.i.d. She  continues taking Tagamet and GI  cocktail on a p.r.n. basis. An element of super esophageal reflux plus or  minus esophageal spasm may be playing a role or could conceive of her  reactive airway disease contributing to at least a component of her chest  pain. At any rate, I am glad to see that symptoms have improved. Will plan  to perform an EGD with esophageal dilation as appropriate in the very near  future. Further recommendations to follow.     ___________________________________________                                         Jonathon Bellows, M.D.   RMR/MEDQ  D:  01/13/2004  T:  01/13/2004  Job:  [id number]   cc:   Meliton Rattan. Willa Rough, M.D.  [address]  [place] [address]  Fax: [phone]   Donzetta Sprung  [address]  [place]  [place] [address]  Fax: [phone]

## 2010-10-20 NOTE — Op Note (Signed)
NAME:  Sabrina Morrison, Sabrina Morrison               ACCOUNT NO.:  [id number]   MEDICAL RECORD NO.:  [id number]          PATIENT TYPE:  AMB   LOCATION:  DAY                           FACILITY:  [facility]   PHYSICIAN:  R. Roetta Sessions, M.D. DATE OF BIRTH:  [birth year]   DATE OF PROCEDURE:  12/14/2005  DATE OF DISCHARGE:                                 OPERATIVE REPORT   PROCEDURE:  Esophagogastroduodenoscopy with Elease Hashimoto dilation.   INDICATION FOR PROCEDURE:  The patient is a 77 year old lady with recurrent  esophageal dysphagia, a history of gastroesophageal reflux disease and a  history of known Schatzki's ring.  She takes over-the-counter H2 blockers  regularly for reflux disease, intolerant of basically all the PPIs.  EGD is  now being done.  This approach has been discussed with the patient at  length, the potential risks, benefits, and alternatives have been reviewed,  questions answered.  She is agreeable.  Please see documentation in the  medical record.   PROCEDURE NOTE:  O2 saturation, blood pressure, pulse, and respiration were  monitored throughout the entire procedure.   CONSCIOUS SEDATION:  IV Versed and Demerol in incremental doses, Cetacaine  spray for topical oropharyngeal anesthesia.   INSTRUMENT USED:  Olympus video chip system.   FINDINGS:  Examination of the tubular esophagus again reveals Schatzki's  ring.  Esophageal mucosa otherwise appears normal.  EG junction easily  traversed.   Stomach:  The gastric cavity was empty and insufflated well with air.  A  thorough examination of the gastric mucosa including a retroflexed view of  the proximal stomach and esophagogastric junction demonstrated no  abnormalities aside from a large hiatal hernia.  The pylorus was patent and  easily traversed.  Examination of the bulb and second portion revealed no  abnormalities.   Therapeutic/diagnostic maneuvers performed:  A 56 French Maloney dilator was  passed to full insertion.  A look back  revealed that the ring had been  ruptured without apparent complication.  The patient tolerated the procedure  well, was reacted in endoscopy.   IMPRESSION:  1.  Schatzki's ring, otherwise normal esophagus status post dilation and      disruption as described above.  2.  Large hiatal hernia, otherwise normal stomach, D1, D2.   RECOMMENDATIONS:  1.  Continue Pepcid or other OTC H2 blocker as needed for reflux.  2.  Follow up with Dr. Reuel Boom as needed.      Jonathon Bellows, M.D.  Electronically Signed     RMR/MEDQ  D:  12/14/2005  T:  12/14/2005  Job:  [id number]   cc:   Donzetta Sprung  Fax: [phone]   Roselyn M. Willa Rough, M.D.  Fax: [phone]

## 2010-10-20 NOTE — Op Note (Signed)
NAME:  Sabrina Morrison, Sabrina Morrison                         ACCOUNT NO.:  [id number]   MEDICAL RECORD NO.:  [id number]                   PATIENT TYPE:  AMB   LOCATION:  DAY                                  FACILITY:  [facility]   PHYSICIAN:  R. Roetta Sessions, M.D.              DATE OF BIRTH:  January 10, 1931   DATE OF PROCEDURE:  09/25/2002  DATE OF DISCHARGE:                                 OPERATIVE REPORT   PROCEDURE:  Esophagogastroduodenoscopy with balloon dilation.   INDICATIONS FOR PROCEDURE:  The patient is a 77 year old lady with history  of a Schatski's ring who is status post dilation back in 1992 now with  recurrent esophageal dysphagia.  She has prominent gastroesophageal reflux  symptoms and is on an aggressive antireflux medical regimen.  EGD is now  being done to further assess her dysphagia and perform dilation as  appropriate.  This approach has been discussed with the patient at the  bedside and previously in my office.  The potential risks, benefits, and  alternatives have been reviewed.  Please see my 08/26/2002 consultation note  for more information.   PROCEDURE:  O2 saturation, blood pressure, pulses, and respirations were  monitored throughout the entire procedure.  Conscious sedation was with  Versed 4 mg IV, Demerol 75 mg IV in divided doses, Cetacaine spray for  topical oropharyngeal anesthesia.  The instrument used was the Olympus video  chip adult gastroscope.   FINDINGS:  Examination of the tubular esophagus revealed a foreshortened  esophagus, with the distal lumen offset.  There appeared to be a muscular  ring at the EG junction.   It was not a straight shot into the stomach.  Again, the lumen had to be  found as the EG junction was traversed.   Stomach:  The gastric cavity was empty and insufflated well with air.  Thorough examination of the gastric mucosa including retroflex view in the  proximal stomach and esophagogastric junction demonstrated a moderate size  hiatal hernia.  The remainder of the gastric mucosa appeared normal.  The  pylorus was patent and easily traversed.   Duodenum:  The bulb and second portion appeared normal.   THERAPEUTIC/DIAGNOSTIC MANEUVERS PERFORMED:  I did not feel that it was safe  to attempt passage of an unguided dilator.  Consequently, I passed a TTS 20-  mm graduated balloon, inflated it fully to 20 mm across the ring for two  minutes and then took it down and removed it.  There was some blood at the  EG junction, and the ring appeared to have been partially dilated.  The  patient tolerated the procedure well and was reactive in endoscopy.   IMPRESSION:  Prominent Schatski's ring, foreshortened esophagus, moderate  size hiatal hernia.  The remainder of the stomach and duodenum through the  second portion appeared normal.  Status post balloon dilation of ring, as  described above.    RECOMMENDATIONS:  1. Continue her current antireflux medical regimen.  Will see her back in     the office in three months.  2. I suspect the patient will need a repeat dilation in the future.  Would     give some consideration to Savary-Gilliard guidewire assisted dilation in     the future.                                               Jonathon Bellows, M.D.    RMR/MEDQ  D:  09/25/2002  T:  09/25/2002  Job:  [id number]   cc:   Donzetta Sprung  [address]  [place]  [place] [address]  Fax: [phone]

## 2011-03-05 ENCOUNTER — Encounter: Payer: Self-pay | Admitting: Internal Medicine

## 2011-04-23 ENCOUNTER — Encounter (HOSPITAL_COMMUNITY): Payer: Self-pay | Admitting: Pharmacy Technician

## 2011-04-23 ENCOUNTER — Ambulatory Visit (INDEPENDENT_AMBULATORY_CARE_PROVIDER_SITE_OTHER): Payer: Medicare Other | Admitting: Gastroenterology

## 2011-04-23 ENCOUNTER — Encounter: Payer: Self-pay | Admitting: Gastroenterology

## 2011-04-23 VITALS — BP 135/67 | HR 61 | Temp 97.8°F | Ht 65.0 in | Wt 174.0 lb

## 2011-04-23 DIAGNOSIS — R197 Diarrhea, unspecified: Secondary | ICD-10-CM

## 2011-04-23 DIAGNOSIS — R1314 Dysphagia, pharyngoesophageal phase: Secondary | ICD-10-CM

## 2011-04-23 DIAGNOSIS — R1013 Epigastric pain: Secondary | ICD-10-CM

## 2011-04-23 DIAGNOSIS — K219 Gastro-esophageal reflux disease without esophagitis: Secondary | ICD-10-CM

## 2011-04-23 DIAGNOSIS — R1310 Dysphagia, unspecified: Secondary | ICD-10-CM

## 2011-04-23 NOTE — Assessment & Plan Note (Signed)
Refractory heartburn, regurgitation, epigastric pain. Patient has been intolerant to numerous PPIs and currently on lansoprazole twice a day. She is also using GI cocktail when necessary, daily Tums, crystallized ginger. Reevaluate at time of endoscopy. Patient states that her symptoms tend to get better after her esophagus is dilated and her swallowing issues have improved.

## 2011-04-23 NOTE — Assessment & Plan Note (Signed)
IBS with diarrhea, currently well controlled.

## 2011-04-23 NOTE — Assessment & Plan Note (Signed)
Recurrent esophageal dysphagia to solid foods with history of recurrent Schatzki ring. Last dilation approximately one year ago. Recommend repeat EGD with dilation in the near future.  I have discussed the risks, alternatives, benefits with regards to but not limited to the risk of reaction to medication, bleeding, infection, perforation and the patient is agreeable to proceed. Written consent to be obtained.

## 2011-04-23 NOTE — Progress Notes (Signed)
Primary Care Physician:  Donzetta Sprung, MD  Primary Gastroenterologist:  Roetta Sessions, MD   Chief Complaint  Patient presents with  . Follow-up    HPI:  Sabrina Morrison is a 77 y.o. female here for further evaluation of recurrent dysphagia. She also has refractory GERD symptoms. The last time we saw her was at time of upper endoscopy with dilation of Schatzki ring about a year ago. She tells me every time she has her esophagus dilated she has a significant improvement of all of her symptoms including her reflux symptoms and asthma. Currently she is taking GI cocktail, crystallized Ginger, TUMS to numerous to count daily. Drinking strong ginger ale she buys at the health food store. She states it burns going down afterwards he feels better. She states her asthma has been more difficult to control lately, she has a chronic cough and hoarseness.  Props up at night, wakes up with burning into face and stuff in mouth. Nausea throughout the day but seems to be better in the evening. Horrible taste in mouth. When she wakes up in the night with symptoms she takes Tum's and Tylenol. Takes lansoprazole bid. Takes Compazine 10mg  for nausea. Complains of burning down to her naval. Chronically she has diarrhea for which he takes Lomotil daily but has some periods of constipation, treats with rice cakes and popcorn. Stool softner when needed.  BM 2-3 times per day. No melena, brbpr. Swallowing, difficulty with all solid foods. Has to eat very slow. Eating just liquid do to issues. Not eating after six oclock because of terrible nocturnal reflux symptoms.  Current Outpatient Prescriptions  Medication Sig Dispense Refill  . acetaminophen (TYLENOL) 500 MG tablet Take 500 mg by mouth every 6 (six) hours as needed.        . Alum & Mag Hydroxide-Simeth (GI COCKTAIL) SUSP Take 30 mLs by mouth.        . beclomethasone (QVAR) 80 MCG/ACT inhaler Inhale 1 puff into the lungs as needed.        . budesonide (PULMICORT  FLEXHALER) 180 MCG/ACT inhaler Inhale 1 puff into the lungs 2 (two) times daily.        . calcium carbonate (OS-CAL) 600 MG TABS Take 600 mg by mouth 2 (two) times daily with a meal.        . diphenoxylate-atropine (LOMOTIL) 2.5-0.025 MG per tablet Take 1 tablet by mouth 4 (four) times daily as needed.        . Ginger, Zingiber officinalis, (GINGER ROOT PO) Take by mouth.        . lansoprazole (PREVACID) 30 MG capsule Take 30 mg by mouth 2 (two) times daily.       . pirbuterol (MAXAIR) 200 MCG/INH inhaler Inhale 2 puffs into the lungs 4 (four) times daily.        . predniSONE (DELTASONE) 20 MG tablet Take 20 mg by mouth daily.        . prochlorperazine (COMPAZINE) 5 MG tablet Take 5 mg by mouth every 6 (six) hours as needed.        . traMADol (ULTRAM) 50 MG tablet Take 50 mg by mouth every 6 (six) hours as needed. Maximum dose= 8 tablets per day       . vitamin B-12 (CYANOCOBALAMIN) 1000 MCG tablet Take 1,000 mcg by mouth daily.          Allergies as of 04/23/2011 - Review Complete 04/23/2011  Allergen Reaction Noted  . Codeine    . Dexlansoprazole  04/05/2010  . Erythromycin ethylsuccinate  04/05/2010  . Esomeprazole magnesium    . Moxifloxacin  04/05/2010  . Omeprazole  04/05/2010  . Sulfonamide derivatives      Past Medical History  Diagnosis Date  . Allergic rhinitis   . Asthma   . GERD (gastroesophageal reflux disease)   . Hypothyroidism   . Gastroparesis 08/2008    mild, 35% retention at 2 hours (normal is less than 30% at 2 hours)  . Schatzki's ring     h/o 8 EGDs  . Irritable bowel syndrome     Past Surgical History  Procedure Date  . S/p hysterectomy   . Segmental colectomy     [place] incidental [place] in [place]  . Dilation and curettage of uterus   . Esophagogastroduodenoscopy 8/08    cork-screwed esophagus,prominent Schatki's ring large hh  . Colonoscopy 3/06    normal, due 08/2014  . Esophagogastroduodenoscopy 04/18/10    prominent schatzki's ring/large  hiatal hernia  otherwise normal stomach    Family History  Problem Relation Age of Onset  . Heart attack Father 15    deceased  . Colon cancer Neg Hx     History   Social History  . Marital Status: Widowed    Spouse Name: N/A    Number of Children: 2  . Years of Education: N/A   Occupational History  . taking computer classes    Social History Main Topics  . Smoking status: Never Smoker   . Smokeless tobacco: Not on file  . Alcohol Use: No  . Drug Use: No  . Sexually Active: Not on file   Other Topics Concern  . Not on file   Social History Narrative  . No narrative on file      ROS:  General: Negative for anorexia, weight loss, fever, chills, fatigue, weakness. Eyes: Negative for vision changes.  ENT: Negative for hoarseness, nasal congestion. CV: Negative for chest pain, angina, palpitations, dyspnea on exertion, peripheral edema.  Respiratory: Positive for wheezing and intermittent shortness of breath. Positive for nonproductive cough.  GI: See history of present illness. GU:  Negative for dysuria, hematuria, urinary incontinence, urinary frequency, nocturnal urination.  MS: Negative for joint pain, low back pain.  Derm: Negative for rash or itching.  Neuro: Negative for weakness, abnormal sensation, seizure, frequent headaches, memory loss, confusion.  Psych: Negative for anxiety, depression, suicidal ideation, hallucinations.  Endo: Negative for unusual weight change.  Heme: Negative for bruising or bleeding. Allergy: Negative for rash or hives.    Physical Examination:  BP 135/67  Pulse 61  Temp(Src) 97.8 F (36.6 C) (Temporal)  Ht 5\' 5"  (1.651 m)  Wt 174 lb (78.926 kg)  BMI 28.96 kg/m2   General: Well-nourished, well-developed in no acute distress.  Head: Normocephalic, atraumatic.   Eyes: Conjunctiva pink, no icterus. Mouth: Oropharyngeal mucosa moist and pink , no lesions erythema or exudate. Neck: Supple without thyromegaly, masses, or  lymphadenopathy.  Lungs: Wheezing in the bases bilaterally. No rhonchi or rales. Heart: Regular rate and rhythm, no murmurs rubs or gallops.  Abdomen: Bowel sounds are normal, nontender, nondistended, no hepatosplenomegaly or masses, no abdominal bruits or    hernia , no rebound or guarding.   Rectal: Not performed. Extremities: No lower extremity edema. No clubbing or deformities.  Neuro: Alert and oriented x 4 , grossly normal neurologically.  Skin: Warm and dry, no rash or jaundice.   Psych: Alert and cooperative, normal mood and affect.

## 2011-04-23 NOTE — Patient Instructions (Signed)
We have scheduled you for an upper endoscopy. Please see separate instructions.  

## 2011-04-23 NOTE — Progress Notes (Signed)
Cc to PCP 

## 2011-04-30 ENCOUNTER — Encounter (HOSPITAL_COMMUNITY)
Admission: RE | Disposition: A | Discharge status: Home / Self Care | Payer: Self-pay | Source: Ambulatory Visit | Attending: Internal Medicine

## 2011-04-30 ENCOUNTER — Encounter (HOSPITAL_COMMUNITY): Payer: Self-pay | Admitting: *Deleted

## 2011-04-30 ENCOUNTER — Ambulatory Visit (HOSPITAL_COMMUNITY)
Admission: RE | Admit: 2011-04-30 | Discharge: 2011-04-30 | Disposition: A | Discharge status: Home / Self Care | Payer: Medicare Other | Source: Ambulatory Visit | Attending: Internal Medicine | Admitting: Internal Medicine

## 2011-04-30 DIAGNOSIS — R1310 Dysphagia, unspecified: Secondary | ICD-10-CM

## 2011-04-30 DIAGNOSIS — K222 Esophageal obstruction: Secondary | ICD-10-CM

## 2011-04-30 DIAGNOSIS — K449 Diaphragmatic hernia without obstruction or gangrene: Secondary | ICD-10-CM

## 2011-04-30 DIAGNOSIS — K219 Gastro-esophageal reflux disease without esophagitis: Secondary | ICD-10-CM

## 2011-04-30 HISTORY — DX: Shortness of breath: R06.02

## 2011-04-30 SURGERY — ESOPHAGOGASTRODUODENOSCOPY (EGD) WITH ESOPHAGEAL DILATION
Anesthesia: Moderate Sedation

## 2011-04-30 MED ORDER — ONDANSETRON HCL 4 MG/2ML IJ SOLN
INTRAMUSCULAR | Status: AC
  Filled 2011-04-30: qty 2

## 2011-04-30 MED ORDER — MIDAZOLAM HCL 5 MG/5ML IJ SOLN
INTRAMUSCULAR | Status: AC
  Filled 2011-04-30: qty 10

## 2011-04-30 MED ORDER — MEPERIDINE HCL 100 MG/ML IJ SOLN
INTRAMUSCULAR | Status: AC
  Filled 2011-04-30: qty 2

## 2011-04-30 MED ORDER — ONDANSETRON HCL 4 MG/2ML IJ SOLN
INTRAMUSCULAR | Status: DC | PRN
  Administered 2011-04-30: 4 mg via INTRAVENOUS

## 2011-04-30 MED ORDER — MIDAZOLAM HCL 5 MG/5ML IJ SOLN
INTRAMUSCULAR | Status: DC | PRN
  Administered 2011-04-30 (×2): 1 mg via INTRAVENOUS
  Administered 2011-04-30: 2 mg via INTRAVENOUS

## 2011-04-30 MED ORDER — MEPERIDINE HCL 100 MG/ML IJ SOLN
INTRAMUSCULAR | Status: DC | PRN
  Administered 2011-04-30: 50 mg via INTRAVENOUS

## 2011-04-30 MED ORDER — BUTAMBEN-TETRACAINE-BENZOCAINE 2-2-14 % EX AERO
INHALATION_SPRAY | CUTANEOUS | Status: DC | PRN
  Administered 2011-04-30: 2 via TOPICAL

## 2011-04-30 MED ORDER — SODIUM CHLORIDE 0.45 % IV SOLN
Freq: Once | INTRAVENOUS | Status: AC
  Administered 2011-04-30: 11:00:00 via INTRAVENOUS

## 2011-06-15 NOTE — H&P (Signed)
  I have seen & examined the patient prior to the procedure(s) today and reviewed the history and physical/consultation.  There have been no changes.  After consideration of the risks, benefits, alternatives and imponderables, the patient has consented to the procedure(s).   

## 2011-06-27 ENCOUNTER — Other Ambulatory Visit (HOSPITAL_COMMUNITY): Payer: Self-pay | Admitting: Family Medicine

## 2011-06-27 DIAGNOSIS — Z139 Encounter for screening, unspecified: Secondary | ICD-10-CM

## 2011-07-20 ENCOUNTER — Ambulatory Visit (HOSPITAL_COMMUNITY): Payer: Medicare Other

## 2011-07-23 ENCOUNTER — Ambulatory Visit (HOSPITAL_COMMUNITY): Payer: Medicare Other

## 2011-07-31 ENCOUNTER — Ambulatory Visit (HOSPITAL_COMMUNITY)
Admission: RE | Admit: 2011-07-31 | Discharge: 2011-07-31 | Disposition: A | Discharge status: Home / Self Care | Payer: Medicare Other | Source: Ambulatory Visit | Attending: Family Medicine | Admitting: Family Medicine

## 2011-07-31 DIAGNOSIS — Z1231 Encounter for screening mammogram for malignant neoplasm of breast: Secondary | ICD-10-CM | POA: Insufficient documentation

## 2011-07-31 DIAGNOSIS — Z139 Encounter for screening, unspecified: Secondary | ICD-10-CM

## 2011-08-20 ENCOUNTER — Encounter (HOSPITAL_COMMUNITY): Payer: Self-pay | Admitting: Pharmacy Technician

## 2011-08-21 ENCOUNTER — Encounter (HOSPITAL_COMMUNITY)
Admission: RE | Admit: 2011-08-21 | Discharge: 2011-08-21 | Disposition: A | Discharge status: Home / Self Care | Payer: Medicare Other | Source: Ambulatory Visit | Attending: Ophthalmology | Admitting: Ophthalmology

## 2011-08-21 ENCOUNTER — Encounter (HOSPITAL_COMMUNITY): Payer: Self-pay

## 2011-08-21 ENCOUNTER — Other Ambulatory Visit: Payer: Self-pay

## 2011-08-21 HISTORY — DX: Other specified postprocedural states: Z98.890

## 2011-08-21 HISTORY — DX: Malignant (primary) neoplasm, unspecified: C80.1

## 2011-08-21 HISTORY — DX: Nausea with vomiting, unspecified: R11.2

## 2011-08-21 LAB — BASIC METABOLIC PANEL
BUN: 18 mg/dL (ref 6–23)
CO2: 31 mEq/L (ref 19–32)
Chloride: 102 mEq/L (ref 96–112)
GFR calc Af Amer: 70 mL/min — ABNORMAL LOW (ref >90)
Glucose, Bld: 125 mg/dL — ABNORMAL HIGH (ref 70–99)
Potassium: 4.8 mEq/L (ref 3.5–5.1)

## 2011-08-21 LAB — HEMOGLOBIN AND HEMATOCRIT, BLOOD: Hemoglobin: 11.4 g/dL — ABNORMAL LOW (ref 12.0–15.0)

## 2011-08-21 NOTE — Patient Instructions (Addendum)
20 Sabrina Morrison  08/21/2011   Your procedure is scheduled on: 3.25.13  Report to [name] at [place] [facility].  Call this number if you have problems the morning of surgery: [phone]   Remember:   Do not eat food:After Midnight.  May have clear liquids:until Midnight .  Clear liquids include soda, tea, black coffee, apple or grape juice, broth.  Take these medicines the morning of surgery with A SIP OF WATER:synthroid, prevacid, compazine, ultram, use all your inhalers before you come   Do not wear jewelry, make-up or nail polish.  Do not wear lotions, powders, or perfumes. You may wear deodorant.  Do not shave 48 hours prior to surgery.  Do not bring valuables to the hospital.  Contacts, dentures or bridgework may not be worn into surgery.  Leave suitcase in the car. After surgery it may be brought to your room.  For patients admitted to the hospital, checkout time is 11:00 AM the day of discharge.   Patients discharged the day of surgery will not be allowed to drive home.  Name and phone number of your driver: family  Special Instructions: N/A   Please read over the following fact sheets that you were given: Pain Booklet, Anesthesia Post-op Instructions and Care and Recovery After Surgery   PATIENT INSTRUCTIONS POST-ANESTHESIA  IMMEDIATELY FOLLOWING SURGERY:  Do not drive or operate machinery for the first twenty four hours after surgery.  Do not make any important decisions for twenty four hours after surgery or while taking narcotic pain medications or sedatives.  If you develop intractable nausea and vomiting or a severe headache please notify your doctor immediately.  FOLLOW-UP:  Please make an appointment with your surgeon as instructed. You do not need to follow up with anesthesia unless specifically instructed to do so.  WOUND CARE INSTRUCTIONS (if applicable):  Keep a dry clean dressing on the anesthesia/puncture wound site if there is drainage.  Once the wound has quit  draining you may leave it open to air.  Generally you should leave the bandage intact for twenty four hours unless there is drainage.  If the epidural site drains for more than 36-48 hours please call the anesthesia department.  QUESTIONS?:  Please feel free to call your physician or the hospital operator if you have any questions, and they will be happy to assist you.     [name] Anesthesia Department [address] [place] [phone] Cataract Surgery  A cataract is a clouding of the lens of the eye. When a lens becomes cloudy, vision is reduced based on the degree and nature of the clouding. Surgery may be needed to improve vision. Surgery removes the cloudy lens and usually replaces it with a substitute lens (intraocular lens, IOL). LET YOUR EYE DOCTOR KNOW ABOUT:  Allergies to food or medicine.   Medicines taken including herbs, eyedrops, over-the-counter medicines, and creams.   Use of steroids (by mouth or creams).   Previous problems with anesthetics or numbing medicine.   History of bleeding problems or blood clots.   Previous surgery.   Other health problems, including diabetes and kidney problems.   Possibility of pregnancy, if this applies.  RISKS AND COMPLICATIONS  Infection.   Inflammation of the eyeball (endophthalmitis) that can spread to both eyes (sympathetic ophthalmia).   Poor wound healing.   If an IOL is inserted, it can later fall out of proper position. This is very uncommon.   Clouding of the part of your eye  that holds an IOL in place. This is called an "after-cataract." These are uncommon, but easily treated.  BEFORE THE PROCEDURE  Do not eat or drink anything except small amounts of water for 8 to 12 before your surgery, or as directed by your caregiver.   Unless you are told otherwise, continue any eyedrops you have been prescribed.   Talk to your primary caregiver about all other medicines that you take (both prescription and  non-prescription). In some cases, you may need to stop or change medicines near the time of your surgery. This is most important if you are taking blood-thinning medicine.Do not stop medicines unless you are told to do so.   Arrange for someone to drive you to and from the procedure.   Do not put contact lenses in either eye on the day of your surgery.  PROCEDURE There is more than one method for safely removing a cataract. Your doctor can explain the differences and help determine which is best for you. Phacoemulsification surgery is the most common form of cataract surgery.  An injection is given behind the eye or eyedrops are given to make this a painless procedure.   A small cut (incision) is made on the edge of the clear, dome-shaped surface that covers the front of the eye (cornea).   A tiny probe is painlessly inserted into the eye. This device gives off ultrasound waves that soften and break up the cloudy center of the lens. This makes it easier for the cloudy lens to be removed by suction.   An IOL may be implanted.   The normal lens of the eye is covered by a clear capsule. Part of that capsule is intentionally left in the eye to support the IOL.   Your surgeon may or may not use stitches to close the incision.  There are other forms of cataract surgery that require a larger incision and stiches to close the eye. This approach is taken in cases where the doctor feels that the cataract cannot be easily removed using phacoemulsification. AFTER THE PROCEDURE  When an IOL is implanted, it does not need care. It becomes a permanent part of your eye and cannot be seen or felt.   Your doctor will schedule follow-up exams to check on your progress.   Review your other medicines with your doctor to see which can be resumed after surgery.   Use eyedrops or take medicine as prescribed by your doctor.  Document Released: 05/10/2011 Document Reviewed: 05/07/2011 [facility] Patient  Information 2012 [place], [place].

## 2011-08-24 MED ORDER — CYCLOPENTOLATE-PHENYLEPHRINE 0.2-1 % OP SOLN
OPHTHALMIC | Status: AC
  Filled 2011-08-24: qty 2

## 2011-08-24 MED ORDER — LIDOCAINE HCL 3.5 % OP GEL
OPHTHALMIC | Status: AC
  Filled 2011-08-24: qty 5

## 2011-08-24 MED ORDER — NEOMYCIN-POLYMYXIN-DEXAMETH 3.5-10000-0.1 OP OINT
TOPICAL_OINTMENT | OPHTHALMIC | Status: AC
  Filled 2011-08-24: qty 3.5

## 2011-08-24 MED ORDER — LIDOCAINE HCL (PF) 1 % IJ SOLN
INTRAMUSCULAR | Status: AC
  Filled 2011-08-24: qty 2

## 2011-08-24 MED ORDER — TETRACAINE HCL 0.5 % OP SOLN
OPHTHALMIC | Status: AC
  Filled 2011-08-24: qty 2

## 2011-08-27 ENCOUNTER — Ambulatory Visit (HOSPITAL_COMMUNITY): Payer: Medicare Other | Admitting: Anesthesiology

## 2011-08-27 ENCOUNTER — Encounter (HOSPITAL_COMMUNITY)
Admission: RE | Disposition: A | Discharge status: Home / Self Care | Payer: Self-pay | Source: Ambulatory Visit | Attending: Ophthalmology

## 2011-08-27 ENCOUNTER — Encounter (HOSPITAL_COMMUNITY): Payer: Self-pay | Admitting: Anesthesiology

## 2011-08-27 ENCOUNTER — Ambulatory Visit (HOSPITAL_COMMUNITY)
Admission: RE | Admit: 2011-08-27 | Discharge: 2011-08-27 | Disposition: A | Discharge status: Home / Self Care | Payer: Medicare Other | Source: Ambulatory Visit | Attending: Ophthalmology | Admitting: Ophthalmology

## 2011-08-27 ENCOUNTER — Encounter (HOSPITAL_COMMUNITY): Payer: Self-pay | Admitting: *Deleted

## 2011-08-27 DIAGNOSIS — Z01810 Encounter for preprocedural cardiovascular examination: Secondary | ICD-10-CM | POA: Insufficient documentation

## 2011-08-27 DIAGNOSIS — Z01812 Encounter for preprocedural laboratory examination: Secondary | ICD-10-CM | POA: Insufficient documentation

## 2011-08-27 DIAGNOSIS — H2510 Age-related nuclear cataract, unspecified eye: Secondary | ICD-10-CM | POA: Insufficient documentation

## 2011-08-27 HISTORY — PX: CATARACT EXTRACTION W/PHACO: SHX586

## 2011-08-27 SURGERY — PHACOEMULSIFICATION, CATARACT, WITH IOL INSERTION
Anesthesia: Monitor Anesthesia Care | Site: Eye | Laterality: Right | Wound class: Clean

## 2011-08-27 MED ORDER — NEOMYCIN-POLYMYXIN-DEXAMETH 0.1 % OP OINT
TOPICAL_OINTMENT | OPHTHALMIC | Status: DC | PRN
  Administered 2011-08-27: 1 via OPHTHALMIC

## 2011-08-27 MED ORDER — LACTATED RINGERS IV SOLN
INTRAVENOUS | Status: DC
  Administered 2011-08-27: 07:00:00 via INTRAVENOUS

## 2011-08-27 MED ORDER — EPINEPHRINE HCL 1 MG/ML IJ SOLN
INTRAMUSCULAR | Status: DC | PRN
  Administered 2011-08-27: 08:00:00

## 2011-08-27 MED ORDER — ONDANSETRON HCL 4 MG/2ML IJ SOLN
INTRAMUSCULAR | Status: AC
  Administered 2011-08-27: 4 mg via INTRAVENOUS
  Filled 2011-08-27: qty 2

## 2011-08-27 MED ORDER — BSS IO SOLN
INTRAOCULAR | Status: DC | PRN
  Administered 2011-08-27: 15 mL via INTRAOCULAR

## 2011-08-27 MED ORDER — LIDOCAINE HCL 3.5 % OP GEL: 1.0000 "application " | Freq: Once | OPHTHALMIC | Status: DC

## 2011-08-27 MED ORDER — ONDANSETRON HCL 4 MG/2ML IJ SOLN: 4.0000 mg | Freq: Once | INTRAMUSCULAR | Status: DC | PRN

## 2011-08-27 MED ORDER — EPINEPHRINE HCL 1 MG/ML IJ SOLN
INTRAMUSCULAR | Status: AC
  Filled 2011-08-27: qty 1

## 2011-08-27 MED ORDER — POVIDONE-IODINE 5 % OP SOLN
OPHTHALMIC | Status: DC | PRN
  Administered 2011-08-27: 1 via OPHTHALMIC

## 2011-08-27 MED ORDER — LIDOCAINE 3.5 % OP GEL OPTIME - NO CHARGE
OPHTHALMIC | Status: DC | PRN
  Administered 2011-08-27: 1 [drp] via OPHTHALMIC

## 2011-08-27 MED ORDER — TETRACAINE HCL 0.5 % OP SOLN
1.0000 [drp] | OPHTHALMIC | Status: AC
  Administered 2011-08-27 (×3): 1 [drp] via OPHTHALMIC

## 2011-08-27 MED ORDER — PHENYLEPHRINE HCL 2.5 % OP SOLN
OPHTHALMIC | Status: AC
  Administered 2011-08-27: 1 [drp] via OPHTHALMIC
  Filled 2011-08-27: qty 2

## 2011-08-27 MED ORDER — CYCLOPENTOLATE-PHENYLEPHRINE 0.2-1 % OP SOLN
1.0000 [drp] | OPHTHALMIC | Status: AC
  Administered 2011-08-27 (×3): 1 [drp] via OPHTHALMIC

## 2011-08-27 MED ORDER — LIDOCAINE HCL (PF) 1 % IJ SOLN
INTRAMUSCULAR | Status: DC | PRN
  Administered 2011-08-27: .6 mL

## 2011-08-27 MED ORDER — ONDANSETRON HCL 4 MG/2ML IJ SOLN
4.0000 mg | Freq: Once | INTRAMUSCULAR | Status: AC
  Administered 2011-08-27: 4 mg via INTRAVENOUS

## 2011-08-27 MED ORDER — PHENYLEPHRINE HCL 2.5 % OP SOLN
1.0000 [drp] | OPHTHALMIC | Status: AC
  Administered 2011-08-27 (×3): 1 [drp] via OPHTHALMIC

## 2011-08-27 MED ORDER — PROVISC 10 MG/ML IO SOLN
INTRAOCULAR | Status: DC | PRN
  Administered 2011-08-27: .85 mL via INTRAOCULAR

## 2011-08-27 MED ORDER — MIDAZOLAM HCL 2 MG/2ML IJ SOLN
1.0000 mg | INTRAMUSCULAR | Status: DC | PRN
  Administered 2011-08-27: 2 mg via INTRAVENOUS

## 2011-08-27 MED ORDER — MIDAZOLAM HCL 2 MG/2ML IJ SOLN
INTRAMUSCULAR | Status: AC
  Administered 2011-08-27: 2 mg via INTRAVENOUS
  Filled 2011-08-27: qty 2

## 2011-08-27 MED ORDER — FENTANYL CITRATE 0.05 MG/ML IJ SOLN: 25.0000 ug | INTRAMUSCULAR | Status: DC | PRN

## 2011-08-27 SURGICAL SUPPLY — 32 items

## 2011-08-27 NOTE — Discharge Instructions (Signed)
Sabrina Morrison  08/27/2011     Instructions  1. Use medications as Instructed.  Shake well before use. Wait 5 minutes between drops.  {OPHTHALMIC ANTIBIOTICS:22167} 4 times a day x 1 week.  {OPHTHALMIC ANTI-INFLAMMATORY:22168} 2 times a day x 4 weeks.  {OPHTHALMIC STEROID:22169} 4 times a day - week 1   3 times a day - Week 2, 2 times a day- Week 3, 1 time a day - Week 4.  2. Do not rub the operative eye. Do not swim underwater for 2 weeks.  3. You may remove the clear shield and resume your normal activities the day after  Surgery. Your eyes may feel more comfortable if you wear dark glasses outside.  4. Call our office at [phone] if you have sudden change in vision, extreme redness or pain. Some fluctuation in vision is normal after surgery. If you have an emergency after hours, call Dr. Alto Denver at [phone].  5. It is important that you attend all of your follow-up appointments.        Follow-up:{follow up:32580} with Gemma Payor, MD.   Dr. Lahoma Crocker: [phone]  Dr. Lita Mains: [phone]  Dr. Alto Denver: [phone]   If you find that you cannot contact your physician, but feel that your signs and   Symptoms warrant a physician's attention, call the Emergency Room at   [phone].   Other{NA AND WUXLKGMW:10272}.

## 2011-08-27 NOTE — Brief Op Note (Signed)
Pre-Op Dx: Cataract OD Post-Op Dx: Cataract OD Surgeon: Atari Novick Anesthesia: Topical with MAC Implant: Alcon [id number] Blood Loss: None Specimen: None Complications: None 

## 2011-08-27 NOTE — Op Note (Signed)
NAME:  Sabrina Morrison, Sabrina Morrison               ACCOUNT NO.:  [id number]  MEDICAL RECORD NO.:  [id number]  LOCATION:  [facility]                          FACILITY:  [facility]  PHYSICIAN:  Susanne Greenhouse, MD       DATE OF BIRTH:  June 12, 1930  DATE OF PROCEDURE:  08/27/2011 DATE OF DISCHARGE:                              OPERATIVE REPORT   PREOPERATIVE DIAGNOSES:  Nuclear cataract, right eye, diagnosis code 366.16.  POSTOPERATIVE DIAGNOSIS:  Nuclear cataract, right eye, diagnosis code 366.16.  OPERATION PERFORMED:  Phacoemulsification with posterior chamber intraocular lens implantation, right eye.  SURGEON:  Bonne Dolores. Jimmi Sidener, MD  ANESTHESIA:  General endotracheal anesthesia.  OPERATIVE SUMMARY:  In the preoperative area, dilating drops were placed into the right eye.  The patient was then brought into the operating room where she was placed under general anesthesia.  The eye was then prepped and draped.  Beginning with a 75 blade, a paracentesis port was made at the surgeon's 2 o'clock position.  The anterior chamber was then filled with a 1% nonpreserved lidocaine solution with epinephrine.  This was followed by Viscoat to deepen the chamber.  A small fornix-based peritomy was performed superiorly.  Next, a single iris hook was placed through the limbus superiorly.  A 2.4-mm keratome blade was then used to make a clear corneal incision over the iris hook.  A bent cystotome needle and Utrata forceps were used to create a continuous tear capsulotomy.  Hydrodissection was performed using balanced salt solution on a fine cannula.  The lens nucleus was then removed using phacoemulsification in a quadrant cracking technique.  The cortical material was then removed with irrigation and aspiration.  The capsular bag and anterior chamber were refilled with Provisc.  The wound was widened to approximately 3 mm and a posterior chamber intraocular lens was placed into the capsular bag without difficulty using an  [employer] lens injecting system.  A single 10-0 nylon suture was then used to close the incision as well as stromal hydration.  The Provisc was removed from the anterior chamber and capsular bag with irrigation and aspiration.  At this point, the wounds were tested for leak, which were negative.  The anterior chamber remained deep and stable.  The patient tolerated the procedure well.  There were no operative complications, and she awoke from general anesthesia without problem.  No surgical specimens.  Prosthetic device used is a Alcon AcrySof posterior chamber lens, model SN60WF, power of 21.5, serial number is [id number].          ______________________________ Susanne Greenhouse, MD     KEH/MEDQ  D:  08/27/2011  T:  08/27/2011  Job:  [id number]

## 2011-08-27 NOTE — Transfer of Care (Signed)
Immediate Anesthesia Transfer of Care Note  Patient: Sabrina Morrison  Procedure(s) Performed: Procedure(s) (LRB): CATARACT EXTRACTION PHACO AND INTRAOCULAR LENS PLACEMENT (IOC) (Right)  Patient Location: Shortstay  Anesthesia Type: MAC  Level of Consciousness: awake  Airway & Oxygen Therapy: Patient Spontanous Breathing   Post-op Assessment: Report given to PACU RN, Post -op Vital signs reviewed and stable and Patient moving all extremities  Post vital signs: Reviewed and stable  Complications: No apparent anesthesia complications

## 2011-08-27 NOTE — Anesthesia Preprocedure Evaluation (Signed)
Anesthesia Evaluation  Patient identified by MRN, date of birth, ID band Patient awake    Reviewed: Allergy & Precautions, H&P , NPO status , Patient's Chart, lab work & pertinent test results  History of Anesthesia Complications (+) PONV  Airway Mallampati: II      Dental  (+) Teeth Intact   Pulmonary shortness of breath, asthma ,  breath sounds clear to auscultation        Cardiovascular negative cardio ROS  Rhythm:Regular     Neuro/Psych  Headaches,    GI/Hepatic GERD-  Medicated and Controlled,  Endo/Other  Hypothyroidism   Renal/GU      Musculoskeletal  (+) Fibromyalgia -  Abdominal   Peds  Hematology   Anesthesia Other Findings   Reproductive/Obstetrics                           Anesthesia Physical Anesthesia Plan  ASA: II  Anesthesia Plan: MAC   Post-op Pain Management:    Induction: Intravenous  Airway Management Planned: Nasal Cannula  Additional Equipment:   Intra-op Plan:   Post-operative Plan:   Informed Consent: I have reviewed the patients History and Physical, chart, labs and discussed the procedure including the risks, benefits and alternatives for the proposed anesthesia with the patient or authorized representative who has indicated his/her understanding and acceptance.     Plan Discussed with:   Anesthesia Plan Comments:         Anesthesia Quick Evaluation

## 2011-08-27 NOTE — H&P (Signed)
I have reviewed the H&P, the patient was re-examined, and I have identified no interval changes in medical condition and plan of care since the history and physical of record  

## 2011-08-27 NOTE — Anesthesia Postprocedure Evaluation (Signed)
  Anesthesia Post-op Note  Patient: Sabrina Morrison  Procedure(s) Performed: Procedure(s) (LRB): CATARACT EXTRACTION PHACO AND INTRAOCULAR LENS PLACEMENT (IOC) (Right)  Patient Location:  Short Stay  Anesthesia Type: MAC  Level of Consciousness: awake  Airway and Oxygen Therapy: Patient Spontanous Breathing  Post-op Pain: none  Post-op Assessment: Post-op Vital signs reviewed, Patient's Cardiovascular Status Stable, Respiratory Function Stable, Patent Airway, No signs of Nausea or vomiting and Pain level controlled  Post-op Vital Signs: Reviewed and stable  Complications: No apparent anesthesia complications

## 2011-08-27 NOTE — Anesthesia Procedure Notes (Signed)
Procedure Name: MAC Date/Time: 08/27/2011 7:25 AM Performed by: Franco Nones Pre-anesthesia Checklist: Patient identified, Emergency Drugs available, Suction available, Timeout performed and Patient being monitored Patient Re-evaluated:Patient Re-evaluated prior to inductionOxygen Delivery Method: Nasal Cannula

## 2011-08-29 ENCOUNTER — Encounter (HOSPITAL_COMMUNITY): Payer: Self-pay | Admitting: Ophthalmology

## 2011-11-01 ENCOUNTER — Ambulatory Visit (HOSPITAL_COMMUNITY): Payer: Medicare Other | Admitting: Psychology

## 2011-11-02 DIAGNOSIS — R011 Cardiac murmur, unspecified: Secondary | ICD-10-CM

## 2011-11-12 ENCOUNTER — Ambulatory Visit (INDEPENDENT_AMBULATORY_CARE_PROVIDER_SITE_OTHER): Payer: Medicare Other | Admitting: Psychology

## 2011-11-12 DIAGNOSIS — F4310 Post-traumatic stress disorder, unspecified: Secondary | ICD-10-CM

## 2011-11-23 ENCOUNTER — Encounter: Payer: Self-pay | Admitting: Internal Medicine

## 2011-11-23 ENCOUNTER — Ambulatory Visit (INDEPENDENT_AMBULATORY_CARE_PROVIDER_SITE_OTHER): Payer: Medicare Other | Admitting: Internal Medicine

## 2011-11-23 ENCOUNTER — Telehealth: Payer: Self-pay | Admitting: Internal Medicine

## 2011-11-23 VITALS — BP 140/68 | HR 60 | Temp 98.0°F | Ht 65.0 in | Wt 173.4 lb

## 2011-11-23 DIAGNOSIS — K449 Diaphragmatic hernia without obstruction or gangrene: Secondary | ICD-10-CM

## 2011-11-23 DIAGNOSIS — K219 Gastro-esophageal reflux disease without esophagitis: Secondary | ICD-10-CM

## 2011-11-23 NOTE — Patient Instructions (Signed)
Repeat pH/impedence study at [facility]- no antiacid drugs whatsoever during study  Further recommendations to follow

## 2011-11-23 NOTE — Telephone Encounter (Signed)
Referral has been made to [facility] for PH+Impedance study and Marilynne Drivers will contact the patient to schedule and patient is aware

## 2011-11-23 NOTE — Progress Notes (Signed)
Primary Care Physician:  Donzetta Sprung, MD Primary Gastroenterologist:  Dr. Jena Gauss  Pre-Procedure History & Physical: HPI:  Sabrina Morrison is a 78 y.o. female here for discussion regarding prospects of laparoscopic antireflux surgery. Patient has a long, long history of large hiatal hernia with intermittent reflux recurrent Schatzki's ring requiring multiple soft of dilations in the past. She's having trouble now tolerating Prevacid 30 mg orally twice daily. She in fact stopped this medication takes OTC Zantac and GI cocktail. No dysphagia since she was dilated last year. She saw Dr. Reuel Boom recently who sent her over to see Dr. Gabriel Cirri, a surgeon in [facility]. He spoke with her regarding the pros and cons of antireflux surgery. I had her evaluated by Dr. Francee Gentile at [facility] in 2006. PH/Impedence studydy was inconclusive. He felt antireflux surgery was not in her best interest. She has symptoms of all the time. She is miserable these days. She also has significant problems with asthma which may be well related to reflux. No dysphagia now. No melena no nausea or vomiting. It is worth noting she also is a history of borderline gastroparesis.  Past Medical History  Diagnosis Date  . Allergic rhinitis   . Asthma   . GERD (gastroesophageal reflux disease)   . Hypothyroidism   . Gastroparesis 08/2008    mild, 35% retention at 2 hours (normal is less than 30% at 2 hours)  . Schatzki's ring     h/o 8 EGDs  . Irritable bowel syndrome   . Shortness of breath   . PONV (postoperative nausea and vomiting)   . Cancer     skin cancer form left arm  . Hiatal hernia 09/01/2004    large    Past Surgical History  Procedure Date  . S/p hysterectomy   . Segmental colectomy     [place] in [place]  . Esophagogastroduodenoscopy 8/08    cork-screwed esophagus,prominent Schatki's ring large hh  . Colonoscopy 3/06    normal, due 08/2014  . Esophagogastroduodenoscopy 04/18/10    prominent schatzki's ring/large hiatal hernia  otherwise normal stomach  . Abdominal hysterectomy   . Colon surgery   . Dilation and curettage of uterus     x2  . Arm skin lesion biopsy / excision     left arm  . Cataract extraction w/phaco 08/27/2011    Procedure: CATARACT EXTRACTION PHACO AND INTRAOCULAR LENS PLACEMENT (IOC);  Surgeon: Gemma Payor, MD;  Location: [facility];  Service: Ophthalmology;  Laterality: Right;  CDE: 15.12    Prior to Admission medications   Medication Sig Start Date End Date Taking? Authorizing Provider  acetaminophen (TYLENOL) 500 MG tablet Take 500 mg by mouth every 6 (six) hours as needed. pain   Yes Historical Provider, MD  albuterol (PROVENTIL HFA;VENTOLIN HFA) 108 (90 BASE) MCG/ACT inhaler Inhale 2 puffs into the lungs every 6 (six) hours as needed. For shortness of breath   Yes Historical Provider, MD  albuterol (PROVENTIL) (2.5 MG/3ML) 0.083% nebulizer solution Take 2.5 mg by nebulization every 6 (six) hours as needed. For shortness of breath   Yes Historical Provider, MD  beclomethasone (QVAR) 80 MCG/ACT inhaler Inhale 1 puff into the lungs as needed. Shortness of breath   Yes Historical Provider, MD  budesonide (PULMICORT FLEXHALER) 180 MCG/ACT inhaler Inhale 1 puff into the lungs 2 (two) times daily.     Yes Historical Provider, MD  calcium-vitamin D (OSCAL WITH D) 500-200 MG-UNIT per tablet Take 1 tablet by mouth 2 (  two) times daily.   Yes Historical Provider, MD  Cranberry 500 MG CAPS Take 1 capsule by mouth daily.   Yes Historical Provider, MD  diphenoxylate-atropine (LOMOTIL) 2.5-0.025 MG per tablet Take 1 tablet by mouth 4 (four) times daily as needed. diarrhea   Yes Historical Provider, MD  fish oil-omega-3 fatty acids 1000 MG capsule Take 1 g by mouth daily.   Yes Historical Provider, MD  fluticasone (FLONASE) 50 MCG/ACT nasal spray Place 1 spray into both nostrils as needed. allergies 02/21/11  Yes Historical Provider, MD  Ginger, Zingiber officinalis,  (GINGER ROOT) 550 MG CAPS Take 1 capsule by mouth 2 (two) times daily.   Yes Historical Provider, MD  loratadine (CLARITIN) 10 MG tablet Take 10 mg by mouth daily.   Yes Historical Provider, MD  Magnesium 250 MG TABS Take 250 mg by mouth daily.   Yes Historical Provider, MD  pirbuterol (MAXAIR) 200 MCG/INH inhaler Inhale 2 puffs into the lungs 4 (four) times daily as needed. Shortness of breath   Yes Historical Provider, MD  prochlorperazine (COMPAZINE) 5 MG tablet Take 5 mg by mouth every 6 (six) hours as needed. Nausea,vomiting   Yes Historical Provider, MD  pyridOXINE (VITAMIN B-6) 100 MG tablet Take 100 mg by mouth daily.   Yes Historical Provider, MD  ranitidine (ZANTAC) 150 MG capsule Take 150 mg by mouth daily.   Yes Historical Provider, MD  SYNTHROID 112 MCG tablet Take 1 tablet by mouth daily. 02/21/11  Yes Historical Provider, MD  traMADol (ULTRAM) 50 MG tablet Take 50 mg by mouth every 6 (six) hours as needed. For pain   Yes Historical Provider, MD  cetirizine (ZYRTEC ALLERGY) 10 MG tablet Take 10 mg by mouth daily as needed. For allergies    Historical Provider, MD  lansoprazole (PREVACID) 30 MG capsule Take 30 mg by mouth 2 (two) times daily.     Historical Provider, MD    Allergies as of 11/23/2011 - Review Complete 11/23/2011  Allergen Reaction Noted  . Codeine Nausea And Vomiting   . Dexlansoprazole  04/05/2010  . Erythromycin ethylsuccinate  04/05/2010  . Esomeprazole magnesium Nausea And Vomiting   . Moxifloxacin Nausea And Vomiting 04/05/2010  . Omeprazole  04/05/2010  . Sulfonamide derivatives Nausea And Vomiting     Family History  Problem Relation Age of Onset  . Heart attack Father 51    deceased  . Colon cancer Neg Hx   . Anesthesia problems Neg Hx   . Hypotension Neg Hx   . Malignant hyperthermia Neg Hx   . Pseudochol deficiency Neg Hx     History   Social History  . Marital Status: Widowed    Spouse Name: N/A    Number of Children: 2  . Years of  Education: N/A   Occupational History  . taking computer classes    Social History Main Topics  . Smoking status: Never Smoker   . Smokeless tobacco: Not on file  . Alcohol Use: No  . Drug Use: No  . Sexually Active: Yes    Birth Control/ Protection: Post-menopausal   Other Topics Concern  . Not on file   Social History Narrative  . No narrative on file    Review of Systems: See HPI, otherwise negative ROS  Physical Exam: BP 140/68  Pulse 60  Temp 98 F (36.7 C) (Temporal)  Ht 5\' 5"  (1.651 m)  Wt 173 lb 6.4 oz (78.654 kg)  BMI 28.86 kg/m2 General:   Alert,  Well-developed,  well-nourished, pleasant and cooperative in NAD Skin:  Intact without significant lesions or rashes. Eyes:  Sclera clear, no icterus.   Conjunctiva pink. Ears:  Normal auditory acuity. Nose:  No deformity, discharge,  or lesions. Mouth:  No deformity or lesions. Neck:  Supple; no masses or thyromegaly. No significant cervical adenopathy. Lungs:  Clear throughout to auscultation.   No wheezes, crackles, or rhonchi. No acute distress. Heart:  Regular rate and rhythm; no murmurs, clicks, rubs,  or gallops. Abdomen: Non-distended, normal bowel sounds No succussion splash..  Soft and nontender without appreciable mass or hepatosplenomegaly.  Pulses:  Normal pulses noted. Extremities:  Without clubbing or edema.  Impression/Plan: Very pleasant 78 year old lady with long-standing symptoms consistent with-soft reflux disease the setting of a large hiatal hernia. She's had a rather recalcitrant Schatzki's ring (likely related to reflux Will) and asthma. She has been off and on and aggressive antireflux medical regimen over the years. She very much wants something done if it is feasible to mechanically bolster her GE junction. This is not unreasonable.  However, I told her that documentation of substantial reflux disease along with a normal manometry would be minimal criteria before offering her to laparoscopic  antireflux surgery.  Recommendations: Will revisit her GERD with a repeat PH/Impedence study while off all acid suppression/antacid medication. If we're able to document abnormal acid and/or non-acidic reflux, I would recommend she see Dr. Carolynn Sayers once again for reconsideration of antireflux surgery. Further recommendations to follow.

## 2011-12-03 ENCOUNTER — Telehealth: Payer: Self-pay | Admitting: Internal Medicine

## 2011-12-03 NOTE — Telephone Encounter (Signed)
[facility] called and patient is scheduled for pH + Impedance study on 07/31 at 12:30 pm and patient is aware

## 2011-12-04 ENCOUNTER — Ambulatory Visit (INDEPENDENT_AMBULATORY_CARE_PROVIDER_SITE_OTHER): Payer: Medicare Other | Admitting: Psychology

## 2011-12-04 DIAGNOSIS — F4310 Post-traumatic stress disorder, unspecified: Secondary | ICD-10-CM

## 2011-12-21 DIAGNOSIS — R0602 Shortness of breath: Secondary | ICD-10-CM

## 2012-02-11 ENCOUNTER — Encounter (HOSPITAL_COMMUNITY): Payer: Self-pay | Admitting: Psychology
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
## 2012-02-13 ENCOUNTER — Ambulatory Visit (HOSPITAL_COMMUNITY): Payer: Self-pay | Admitting: Psychiatry

## 2012-02-25 ENCOUNTER — Ambulatory Visit (HOSPITAL_COMMUNITY): Payer: Self-pay | Admitting: Psychology

## 2012-03-07 ENCOUNTER — Ambulatory Visit (HOSPITAL_COMMUNITY): Payer: Self-pay | Admitting: Psychology

## 2012-03-07 ENCOUNTER — Ambulatory Visit (INDEPENDENT_AMBULATORY_CARE_PROVIDER_SITE_OTHER): Payer: Medicare Other | Admitting: Psychology

## 2012-03-07 DIAGNOSIS — F4310 Post-traumatic stress disorder, unspecified: Secondary | ICD-10-CM
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
## 2012-04-07 ENCOUNTER — Ambulatory Visit (HOSPITAL_COMMUNITY): Payer: Self-pay | Admitting: Psychology

## 2012-04-07 ENCOUNTER — Ambulatory Visit (HOSPITAL_COMMUNITY): Payer: Self-pay | Admitting: Psychiatry

## 2012-04-16 ENCOUNTER — Ambulatory Visit (HOSPITAL_COMMUNITY): Payer: Self-pay | Admitting: Psychiatry

## 2012-05-12 DIAGNOSIS — R011 Cardiac murmur, unspecified: Secondary | ICD-10-CM

## 2012-05-15 ENCOUNTER — Encounter: Payer: Self-pay | Admitting: Internal Medicine

## 2012-05-19 ENCOUNTER — Ambulatory Visit (INDEPENDENT_AMBULATORY_CARE_PROVIDER_SITE_OTHER): Payer: Medicare Other | Admitting: Gastroenterology

## 2012-05-19 ENCOUNTER — Encounter: Payer: Self-pay | Admitting: Gastroenterology

## 2012-05-19 VITALS — BP 129/77 | HR 71 | Temp 97.4°F | Ht 65.0 in | Wt 180.6 lb

## 2012-05-19 DIAGNOSIS — D509 Iron deficiency anemia, unspecified: Secondary | ICD-10-CM

## 2012-05-19 MED ORDER — PEG 3350-KCL-NA BICARB-NACL 420 G PO SOLR: 4000.0000 mL | ORAL | Status: DC

## 2012-05-19 NOTE — Progress Notes (Signed)
Referring Provider: Donzetta Sprung, MD Primary Care Physician:  Donzetta Sprung, MD Primary GI: Dr. Jena Gauss   Chief Complaint  Patient presents with  . Colonoscopy  . EGD    HPI:   78 year old female who presents today at the request of Dr. Reuel Boom secondary to new onset IDA. Hgb 8.5, Hct 27.1, microcytic anemia. Iron 18, ferritin 12. Labs from Nov 2013. Last seen in our office in June 2013 for GERD f/u. Actually passed out and went to emergency room at [facility]. Thought fatigue was from fibromyalgia.   Last TCS in 2006, normal. Last EGD Nov 2011 with Schatzki's ring and large hiatal hernia. When she is constipated, she notes very small amounts of bright red blood. Notes she was told she was heme + at doctor's office. No melena noted. Aches all over all the time with the fibromyalgia. No N/V, +reflux, was referred to [facility] for consideration of anti-reflux surgery, but was told could not do anything until IDA worked up. She has a significant hx of GERD, large hiatal hernia, Schatzki's ring.   On Dec 4th, received iron infusion. Was feeling fatigued prior to this. Now feels better. Had recent echocardiogram, noted it was fine. No ASA, no NSAIDs, no aspirin powders.   Past Medical History  Diagnosis Date  . Allergic rhinitis   . Asthma   . GERD (gastroesophageal reflux disease)   . Hypothyroidism   . Gastroparesis 08/2008    mild, 35% retention at 2 hours (normal is less than 30% at 2 hours)  . Schatzki's ring     h/o 8 EGDs  . Irritable bowel syndrome   . Shortness of breath   . PONV (postoperative nausea and vomiting)   . Cancer     skin cancer form left arm  . Hiatal hernia 09/01/2004    large    Past Surgical History  Procedure Date  . S/p hysterectomy   . Segmental colectomy     [place] in [place]  . Esophagogastroduodenoscopy 8/08    RMR: cork-screwed esophagus,prominent Schatki's ring large hh  . Colonoscopy 09/01/2004    RMR: normal, due 08/2014  .  Esophagogastroduodenoscopy 04/18/10    prominent schatzki's ring/large hiatal hernia  otherwise normal stomach  . Abdominal hysterectomy   . Colon surgery   . Dilation and curettage of uterus     x2  . Arm skin lesion biopsy / excision     left arm  . Cataract extraction w/phaco 08/27/2011    Procedure: CATARACT EXTRACTION PHACO AND INTRAOCULAR LENS PLACEMENT (IOC);  Surgeon: Gemma Payor, MD;  Location: [facility];  Service: Ophthalmology;  Laterality: Right;  CDE: 15.12  . Esophagogastroduodenoscopy  09/25/2002    RMR: Prominent Schatski's ring, foreshortened esophagus, moderate size hiatal hernia.  The remainder of the stomach and duodenum through the second portion appeared normal.  Status post balloon dilation of ring,  . Esophagogastroduodenoscopy 01/28/2004    ONG:[id number] ring status post dilation as described above. Otherwise normal/ Large hiatal hernia  . Esophagogastroduodenoscopy 09/01/2004    RMR: Schatzki's ring, otherwise normal esophagus status post dilation/Moderately large hiatal hernia  . Esophagogastroduodenoscopy  12/14/2005    RMR: Schatzki's ring, otherwise normal esophagus status post dilation /Large hiatal hernia    Current Outpatient Prescriptions  Medication Sig Dispense Refill  . acetaminophen (TYLENOL) 500 MG tablet Take 500 mg by mouth every 6 (six) hours as needed. pain      . albuterol (PROVENTIL) (2.5 MG/3ML) 0.083% nebulizer solution Take 2.5 mg by nebulization  every 6 (six) hours as needed. For shortness of breath      . Alum & Mag Hydroxide-Simeth (GI COCKTAIL) SUSP suspension Take 30 mLs by mouth 2 (two) times daily. Shake well.      . beclomethasone (QVAR) 80 MCG/ACT inhaler Inhale 1 puff into the lungs as needed. Shortness of breath      . budesonide (PULMICORT FLEXHALER) 180 MCG/ACT inhaler Inhale 1 puff into the lungs 2 (two) times daily.        . CYMBALTA 60 MG capsule Take 60 mg by mouth daily.       . diphenoxylate-atropine (LOMOTIL) 2.5-0.025 MG per  tablet Take 1 tablet by mouth 4 (four) times daily as needed. diarrhea      . EPIPEN 2-PAK 0.3 MG/0.3ML DEVI 0.3 mg once.       . lansoprazole (PREVACID) 30 MG capsule Take 30 mg by mouth 2 (two) times daily.       Marland Kitchen loratadine (CLARITIN) 10 MG tablet Take 10 mg by mouth daily.      . pirbuterol (MAXAIR) 200 MCG/INH inhaler Inhale 2 puffs into the lungs 4 (four) times daily as needed. Shortness of breath      . predniSONE (DELTASONE) 20 MG tablet Take 20 mg by mouth daily.       . prochlorperazine (COMPAZINE) 5 MG tablet Take 5 mg by mouth every 6 (six) hours as needed. Nausea,vomiting      . Simethicone (GAS-X PO) Take by mouth 4 (four) times daily as needed.      Marland Kitchen SYNTHROID 112 MCG tablet Take 1 tablet by mouth daily.      . [DISCONTINUED] calcium carbonate (OS-CAL) 600 MG TABS Take 600 mg by mouth 2 (two) times daily with a meal.          Allergies as of 05/19/2012 - Review Complete 05/19/2012  Allergen Reaction Noted  . Codeine Nausea And Vomiting   . Dexlansoprazole  04/05/2010  . Erythromycin ethylsuccinate  04/05/2010  . Esomeprazole magnesium Nausea And Vomiting   . Moxifloxacin Nausea And Vomiting 04/05/2010  . Omeprazole  04/05/2010  . Sulfonamide derivatives Nausea And Vomiting     Family History  Problem Relation Age of Onset  . Heart attack Father 51    deceased  . Colon cancer Neg Hx   . Anesthesia problems Neg Hx   . Hypotension Neg Hx   . Malignant hyperthermia Neg Hx   . Pseudochol deficiency Neg Hx     History   Social History  . Marital Status: Widowed    Spouse Name: N/A    Number of Children: 2  . Years of Education: N/A   Occupational History  . taking computer classes    Social History Main Topics  . Smoking status: Never Smoker   . Smokeless tobacco: None  . Alcohol Use: No  . Drug Use: No  . Sexually Active: Yes    Birth Control/ Protection: Post-menopausal   Other Topics Concern  . None   Social History Narrative  . None    Review  of Systems: Gen: SEE HPI CV: Denies chest pain, palpitations, syncope, peripheral edema, and claudication. Resp: Denies dyspnea at rest, cough, wheezing, coughing up blood, and pleurisy. GI: SEE HPI Derm: Denies rash, itching, dry skin Psych: Denies depression, anxiety, memory loss, confusion. No homicidal or suicidal ideation.  Heme: Denies bruising, bleeding, and enlarged lymph nodes.  Physical Exam: BP 129/77  Pulse 71  Temp 97.4 F (36.3 C) (Oral)  Ht 5\' 5"  (1.651 m)  Wt 180 lb 9.6 oz (81.92 kg)  BMI 30.05 kg/m2 General:   Alert and oriented. No distress noted. Pleasant and cooperative.  Head:  Normocephalic and atraumatic. Eyes:  Conjuctiva clear without scleral icterus. Mouth:  Oral mucosa pink and moist. Good dentition. No lesions. Neck:  Supple, without mass or thyromegaly. Heart:  S1, S2 present without murmurs, rubs, or gallops. Regular rate and rhythm. Abdomen:  +BS, soft, non-tender and non-distended. No rebound or guarding. No HSM or masses noted. Msk:  Symmetrical without gross deformities. Normal posture. Extremities:  Without edema. Neurologic:  Alert and  oriented x4;  grossly normal neurologically. Skin:  Intact without significant lesions or rashes. Cervical Nodes:  No significant cervical adenopathy. Psych:  Alert and cooperative. Normal mood and affect.

## 2012-05-19 NOTE — Patient Instructions (Addendum)
Please have blood work completed. We will call you with the results.  You have been scheduled for a colonoscopy and upper endoscopy to evaluate for any source of anemia. Further recommendations to follow once this completed.   Have a wonderful Christmas!

## 2012-05-20 LAB — IRON: Iron: 50 ug/dL (ref 42–145)

## 2012-05-20 LAB — FERRITIN: Ferritin: 278 ng/mL (ref 10–291)

## 2012-05-20 LAB — CBC WITH DIFFERENTIAL/PLATELET
Eosinophils Absolute: 0.2 10*3/uL (ref 0.0–0.7)
Hemoglobin: 10.9 g/dL — ABNORMAL LOW (ref 12.0–15.0)
Lymphocytes Relative: 32 % (ref 12–46)
Lymphs Abs: 2.7 10*3/uL (ref 0.7–4.0)
MCH: 26.3 pg (ref 26.0–34.0)
Monocytes Relative: 10 % (ref 3–12)
Neutro Abs: 4.6 10*3/uL (ref 1.7–7.7)
Neutrophils Relative %: 55 % (ref 43–77)
RBC: 4.14 MIL/uL (ref 3.87–5.11)

## 2012-05-21 ENCOUNTER — Telehealth: Payer: Self-pay | Admitting: Internal Medicine

## 2012-05-21 ENCOUNTER — Encounter (HOSPITAL_COMMUNITY): Payer: Self-pay | Admitting: Pharmacy Technician

## 2012-05-21 NOTE — Telephone Encounter (Signed)
Pt seen on Monday and was calling to see if her lab results are back. Please call her at [phone]

## 2012-05-21 NOTE — Telephone Encounter (Signed)
Routed to AS 

## 2012-05-21 NOTE — Telephone Encounter (Signed)
Addressed under result notes.  

## 2012-05-21 NOTE — Progress Notes (Signed)
Quick Note:  Nov 2013 Hgb 8.5, Hct 27.1, microcytic anemia. Iron 18, ferritin 12.  Hgb improved, iron and ferritin normalized; however, pt received iron infusion in the interim. Continue with procedures as planned. ______

## 2012-05-22 NOTE — Progress Notes (Signed)
Quick Note:  Tried to call pt- LMOM ______ 

## 2012-05-23 NOTE — Progress Notes (Signed)
Quick Note:  Pt aware ______ 

## 2012-05-25 DIAGNOSIS — D509 Iron deficiency anemia, unspecified: Secondary | ICD-10-CM | POA: Insufficient documentation

## 2012-05-25 NOTE — Assessment & Plan Note (Addendum)
78 year old female with new onset IDA, with Hgb 8.5, Hct 27.1, microcytic anemia. Iron 18, ferritin 12. Labs from Nov 2013. Last seen in our office in June 2013 for GERD f/u. Reports heme + through PCP; she actually received an iron fusion on Dec 4th. Symptomatic with fatigue but no abdominal pain, change in bowel habits, N/V.. No melena noted, +scant hematochezia in the setting of constipation. Last TCS in 2006, normal. Last EGD Nov 2011 with Schatzki's ring and large hiatal hernia. Needs lower and upper GI with possible capsule endoscopy at a later date if no source for IDA via upper and lower GI evaluation. Further work-up for anti-reflux surgery on hold until IDA is further evaluated.   Proceed with TCS/EGD with Dr. Jena Gauss in near future: the risks, benefits, and alternatives have been discussed with the patient in detail. The patient states understanding and desires to proceed. Likely will need capsule if no source for IDA via procedures Update CBC, ferritin, iron.

## 2012-05-27 NOTE — Progress Notes (Signed)
Faxed to PCP

## 2012-06-05 ENCOUNTER — Encounter (HOSPITAL_COMMUNITY): Payer: Self-pay | Admitting: *Deleted

## 2012-06-05 ENCOUNTER — Encounter (HOSPITAL_COMMUNITY)
Admission: RE | Disposition: A | Discharge status: Home / Self Care | Payer: Self-pay | Source: Ambulatory Visit | Attending: Internal Medicine

## 2012-06-05 ENCOUNTER — Ambulatory Visit (HOSPITAL_COMMUNITY)
Admission: RE | Admit: 2012-06-05 | Discharge: 2012-06-05 | Disposition: A | Discharge status: Home / Self Care | Payer: Medicare Other | Source: Ambulatory Visit | Attending: Internal Medicine | Admitting: Internal Medicine

## 2012-06-05 DIAGNOSIS — D509 Iron deficiency anemia, unspecified: Secondary | ICD-10-CM

## 2012-06-05 DIAGNOSIS — D126 Benign neoplasm of colon, unspecified: Secondary | ICD-10-CM

## 2012-06-05 DIAGNOSIS — K5730 Diverticulosis of large intestine without perforation or abscess without bleeding: Secondary | ICD-10-CM | POA: Insufficient documentation

## 2012-06-05 DIAGNOSIS — K648 Other hemorrhoids: Secondary | ICD-10-CM | POA: Insufficient documentation

## 2012-06-05 DIAGNOSIS — D128 Benign neoplasm of rectum: Secondary | ICD-10-CM | POA: Insufficient documentation

## 2012-06-05 DIAGNOSIS — R195 Other fecal abnormalities: Secondary | ICD-10-CM

## 2012-06-05 DIAGNOSIS — K449 Diaphragmatic hernia without obstruction or gangrene: Secondary | ICD-10-CM

## 2012-06-05 HISTORY — PX: COLONOSCOPY WITH ESOPHAGOGASTRODUODENOSCOPY (EGD): SHX5779

## 2012-06-05 SURGERY — COLONOSCOPY WITH ESOPHAGOGASTRODUODENOSCOPY (EGD)
Anesthesia: Moderate Sedation

## 2012-06-05 MED ORDER — MIDAZOLAM HCL 5 MG/5ML IJ SOLN
INTRAMUSCULAR | Status: AC
  Filled 2012-06-05: qty 10

## 2012-06-05 MED ORDER — MEPERIDINE HCL 100 MG/ML IJ SOLN
INTRAMUSCULAR | Status: DC | PRN
  Administered 2012-06-05: 50 mg via INTRAVENOUS
  Administered 2012-06-05: 25 mg via INTRAVENOUS

## 2012-06-05 MED ORDER — ONDANSETRON HCL 4 MG/2ML IJ SOLN
INTRAMUSCULAR | Status: DC | PRN
  Administered 2012-06-05: 4 mg via INTRAVENOUS

## 2012-06-05 MED ORDER — MIDAZOLAM HCL 5 MG/5ML IJ SOLN
INTRAMUSCULAR | Status: DC | PRN
  Administered 2012-06-05 (×2): 2 mg via INTRAVENOUS
  Administered 2012-06-05 (×4): 1 mg via INTRAVENOUS

## 2012-06-05 MED ORDER — SODIUM CHLORIDE 0.45 % IV SOLN
INTRAVENOUS | Status: DC
  Administered 2012-06-05: 10:00:00 via INTRAVENOUS

## 2012-06-05 MED ORDER — ONDANSETRON HCL 4 MG/2ML IJ SOLN
INTRAMUSCULAR | Status: AC
  Filled 2012-06-05: qty 2

## 2012-06-05 MED ORDER — MEPERIDINE HCL 100 MG/ML IJ SOLN
INTRAMUSCULAR | Status: AC
  Filled 2012-06-05: qty 2

## 2012-06-05 MED ORDER — STERILE WATER FOR IRRIGATION IR SOLN
Status: DC | PRN
  Administered 2012-06-05: 13:00:00

## 2012-06-05 MED ORDER — BUTAMBEN-TETRACAINE-BENZOCAINE 2-2-14 % EX AERO
INHALATION_SPRAY | CUTANEOUS | Status: DC | PRN
  Administered 2012-06-05: 2 via TOPICAL

## 2012-06-05 NOTE — Interval H&P Note (Signed)
History and Physical Interval Note:  06/05/2012 12:29 PM  Sabrina Morrison  has presented today for surgery, with the diagnosis of IDA  The various methods of treatment have been discussed with the patient and family. After consideration of risks, benefits and other options for treatment, the patient has consented to  Procedure(s) (LRB) with comments: COLONOSCOPY WITH ESOPHAGOGASTRODUODENOSCOPY (EGD) (N/A) - 11:00-moved to 12:30pm [name] to notify pt as a surgical intervention .  The patient's history has been reviewed, patient examined, no change in status, stable for surgery.  I have reviewed the patient's chart and labs.  Questions were answered to the patient's satisfaction.     Eula Listen  As above.The risks, benefits, limitations, imponderables and alternatives regarding both EGD and colonoscopy have been reviewed with the patient. Questions have been answered. All parties agreeable.

## 2012-06-05 NOTE — H&P (View-Only) (Signed)
Referring Provider: Daniel, Terry, MD Primary Care Physician:  DANIEL, TERRY, MD Primary GI: Dr. Rourk   Chief Complaint  Patient presents with  . Colonoscopy  . EGD    HPI:   79-year-old female who presents today at the request of Dr. Daniel secondary to new onset IDA. Hgb 8.5, Hct 27.1, microcytic anemia. Iron 18, ferritin 12. Labs from Nov 2013. Last seen in our office in June 2013 for GERD f/u. Actually passed out and went to emergency room at [facility]. Thought fatigue was from fibromyalgia.   Last TCS in 2006, normal. Last EGD Nov 2011 with Schatzki's ring and large hiatal hernia. When she is constipated, she notes very small amounts of bright red blood. Notes she was told she was heme + at doctor's office. No melena noted. Aches all over all the time with the fibromyalgia. No N/V, +reflux, was referred to [facility] for consideration of anti-reflux surgery, but was told could not do anything until IDA worked up. She has a significant hx of GERD, large hiatal hernia, Schatzki's ring.   On Dec 4th, received iron infusion. Was feeling fatigued prior to this. Now feels better. Had recent echocardiogram, noted it was fine. No ASA, no NSAIDs, no aspirin powders.   Past Medical History  Diagnosis Date  . Allergic rhinitis   . Asthma   . GERD (gastroesophageal reflux disease)   . Hypothyroidism   . Gastroparesis 08/2008    mild, 35% retention at 2 hours (normal is less than 30% at 2 hours)  . Schatzki's ring     h/o 8 EGDs  . Irritable bowel syndrome   . Shortness of breath   . PONV (postoperative nausea and vomiting)   . Cancer     skin cancer form left arm  . Hiatal hernia 09/01/2004    large    Past Surgical History  Procedure Date  . S/p hysterectomy   . Segmental colectomy     with incidental appendectomy in [place]  . Esophagogastroduodenoscopy 8/08    RMR: cork-screwed esophagus,prominent Schatki's ring large hh  . Colonoscopy 09/01/2004    RMR: normal, due 08/2014  .  Esophagogastroduodenoscopy 04/18/10    prominent schatzki's ring/large hiatal hernia  otherwise normal stomach  . Abdominal hysterectomy   . Colon surgery   . Dilation and curettage of uterus     x2  . Arm skin lesion biopsy / excision     left arm  . Cataract extraction w/phaco 08/27/2011    Procedure: CATARACT EXTRACTION PHACO AND INTRAOCULAR LENS PLACEMENT (IOC);  Surgeon: Kerry Hunt, MD;  Location: [facility];  Service: Ophthalmology;  Laterality: Right;  CDE: 15.12  . Esophagogastroduodenoscopy  09/25/2002    RMR: Prominent Schatski's ring, foreshortened esophagus, moderate size hiatal hernia.  The remainder of the stomach and duodenum through the second portion appeared normal.  Status post balloon dilation of ring,  . Esophagogastroduodenoscopy 01/28/2004    RMR:Schatzki's ring status post dilation as described above. Otherwise normal/ Large hiatal hernia  . Esophagogastroduodenoscopy 09/01/2004    RMR: Schatzki's ring, otherwise normal esophagus status post dilation/Moderately large hiatal hernia  . Esophagogastroduodenoscopy  12/14/2005    RMR: Schatzki's ring, otherwise normal esophagus status post dilation /Large hiatal hernia    Current Outpatient Prescriptions  Medication Sig Dispense Refill  . acetaminophen (TYLENOL) 500 MG tablet Take 500 mg by mouth every 6 (six) hours as needed. pain      . albuterol (PROVENTIL) (2.5 MG/3ML) 0.083% nebulizer solution Take 2.5 mg by nebulization   every 6 (six) hours as needed. For shortness of breath      . Alum & Mag Hydroxide-Simeth (GI COCKTAIL) SUSP suspension Take 30 mLs by mouth 2 (two) times daily. Shake well.      . beclomethasone (QVAR) 80 MCG/ACT inhaler Inhale 1 puff into the lungs as needed. Shortness of breath      . budesonide (PULMICORT FLEXHALER) 180 MCG/ACT inhaler Inhale 1 puff into the lungs 2 (two) times daily.        . CYMBALTA 60 MG capsule Take 60 mg by mouth daily.       . diphenoxylate-atropine (LOMOTIL) 2.5-0.025 MG per  tablet Take 1 tablet by mouth 4 (four) times daily as needed. diarrhea      . EPIPEN 2-PAK 0.3 MG/0.3ML DEVI 0.3 mg once.       . lansoprazole (PREVACID) 30 MG capsule Take 30 mg by mouth 2 (two) times daily.       . loratadine (CLARITIN) 10 MG tablet Take 10 mg by mouth daily.      . pirbuterol (MAXAIR) 200 MCG/INH inhaler Inhale 2 puffs into the lungs 4 (four) times daily as needed. Shortness of breath      . predniSONE (DELTASONE) 20 MG tablet Take 20 mg by mouth daily.       . prochlorperazine (COMPAZINE) 5 MG tablet Take 5 mg by mouth every 6 (six) hours as needed. Nausea,vomiting      . Simethicone (GAS-X PO) Take by mouth 4 (four) times daily as needed.      . SYNTHROID 112 MCG tablet Take 1 tablet by mouth daily.      . [DISCONTINUED] calcium carbonate (OS-CAL) 600 MG TABS Take 600 mg by mouth 2 (two) times daily with a meal.          Allergies as of 05/19/2012 - Review Complete 05/19/2012  Allergen Reaction Noted  . Codeine Nausea And Vomiting   . Dexlansoprazole  04/05/2010  . Erythromycin ethylsuccinate  04/05/2010  . Esomeprazole magnesium Nausea And Vomiting   . Moxifloxacin Nausea And Vomiting 04/05/2010  . Omeprazole  04/05/2010  . Sulfonamide derivatives Nausea And Vomiting     Family History  Problem Relation Age of Onset  . Heart attack Father 51    deceased  . Colon cancer Neg Hx   . Anesthesia problems Neg Hx   . Hypotension Neg Hx   . Malignant hyperthermia Neg Hx   . Pseudochol deficiency Neg Hx     History   Social History  . Marital Status: Widowed    Spouse Name: N/A    Number of Children: 2  . Years of Education: N/A   Occupational History  . taking computer classes    Social History Main Topics  . Smoking status: Never Smoker   . Smokeless tobacco: None  . Alcohol Use: No  . Drug Use: No  . Sexually Active: Yes    Birth Control/ Protection: Post-menopausal   Other Topics Concern  . None   Social History Narrative  . None    Review  of Systems: Gen: SEE HPI CV: Denies chest pain, palpitations, syncope, peripheral edema, and claudication. Resp: Denies dyspnea at rest, cough, wheezing, coughing up blood, and pleurisy. GI: SEE HPI Derm: Denies rash, itching, dry skin Psych: Denies depression, anxiety, memory loss, confusion. No homicidal or suicidal ideation.  Heme: Denies bruising, bleeding, and enlarged lymph nodes.  Physical Exam: BP 129/77  Pulse 71  Temp 97.4 F (36.3 C) (Oral)    Ht 5' 5" (1.651 m)  Wt 180 lb 9.6 oz (81.92 kg)  BMI 30.05 kg/m2 General:   Alert and oriented. No distress noted. Pleasant and cooperative.  Head:  Normocephalic and atraumatic. Eyes:  Conjuctiva clear without scleral icterus. Mouth:  Oral mucosa pink and moist. Good dentition. No lesions. Neck:  Supple, without mass or thyromegaly. Heart:  S1, S2 present without murmurs, rubs, or gallops. Regular rate and rhythm. Abdomen:  +BS, soft, non-tender and non-distended. No rebound or guarding. No HSM or masses noted. Msk:  Symmetrical without gross deformities. Normal posture. Extremities:  Without edema. Neurologic:  Alert and  oriented x4;  grossly normal neurologically. Skin:  Intact without significant lesions or rashes. Cervical Nodes:  No significant cervical adenopathy. Psych:  Alert and cooperative. Normal mood and affect.  

## 2012-06-05 NOTE — Op Note (Signed)
[facility] [address] [place], [address]   COLONOSCOPY PROCEDURE REPORT  PATIENT: Sabrina Morrison, Sabrina Morrison  MR#:         [id number] BIRTHDATE: 08-Nov-1930 , 81  yrs. old GENDER: Female ENDOSCOPIST: R.  Roetta Sessions, MD FACP [facility] REFERRED BY:  Donzetta Sprung, M.D. PROCEDURE DATE:  06/05/2012 PROCEDURE:     Ileocolonoscopy  with snare polypectomy  INDICATIONS: iron deficiency anemia; Hemoccult positive stool  INFORMED CONSENT:  The risks, benefits, alternatives and imponderables including but not limited to bleeding, perforation as well as the possibility of a missed lesion have been reviewed.  The potential for biopsy, lesion removal, etc. have also been discussed.  Questions have been answered.  All parties agreeable. Please see the history and physical in the medical record for more information.  MEDICATIONS: Versed 8 mg IV and Demerol 75 mg IV in divided doses. Zofran 4 mg  DESCRIPTION OF PROCEDURE:  After a digital rectal exam was performed, the EC-[id number] ([id number])  colonoscope was advanced from the anus through the rectum and colon to the area of the cecum, ileocecal valve and appendiceal orifice.  The cecum was deeply intubated.  These structures were well-seen and photographed for the record.  From the level of the cecum and ileocecal valve, the scope was slowly and cautiously withdrawn.  The mucosal surfaces were carefully surveyed utilizing scope tip deflection to facilitate fold flattening as needed.  The scope was pulled down into the rectum where a thorough examination including retroflexion was performed.    FINDINGS:  Adequate preparation. Minimal internal hemorrhoids; otherwise, normal rectum. Scattered pancolonic diverticula; patient also had (1) 4 mm polyp in the mid descending segment; the patient had (3) 5 mm polyps in the rectosigmoid segment. The remainder of the colonic mucosa as well as the distal 10 cm of terminal ileal mucosa appeared  normal.  THERAPEUTIC / DIAGNOSTIC MANEUVERS PERFORMED:  The descending colon polyp was cold snared; B3 rectosigmoid polyps were hot snare removed.  COMPLICATIONS: None  CECAL WITHDRAWAL TIME:  18 minutes  IMPRESSION:  Colonic diverticulosis. Colonic polyps-removed as described above.  RECOMMENDATIONS: Followup on pathology report. See EGD report.   _______________________________ eSigned:  R. Roetta Sessions, MD FACP [facility] 06/05/2012 1:35 PM   CC:

## 2012-06-05 NOTE — Op Note (Signed)
[facility] [address] [place], [address]   ENDOSCOPY PROCEDURE REPORT  PATIENT: Sabrina Morrison, Sabrina Morrison  MR#: [id number] BIRTHDATE: 11/20/1930 , 81  yrs. old GENDER: Female ENDOSCOPIST: R.  Roetta Sessions, MD FACP [facility] REFERRED BY:  Donzetta Sprung, M.D. PROCEDURE DATE:  06/05/2012 PROCEDURE:     EGD with duodenal biopsy  INDICATIONS:     Prominent iron deficiency anemia; history Hemoccult-positive stool  INFORMED CONSENT:   The risks, benefits, limitations, alternatives and imponderables have been discussed.  The potential for biopsy, esophogeal dilation, etc. have also been reviewed.  Questions have been answered.  All parties agreeable.  Please see the history and physical in the medical record for more information.  MEDICATIONS:     Versed 5 mg IV and Demerol 75 mg IV in divided doses. Cetacaine spray.  DESCRIPTION OF PROCEDURE:   The Pentax Gastroscope [id number] endoscope was introduced through the mouth and advanced to the second portion of the duodenum without difficulty or limitations. The mucosal surfaces were surveyed very carefully during advancement of the scope and upon withdrawal.  Retroflexion view of the proximal stomach and esophagogastric junction was performed.      FINDINGS:    Somewhat "corkscrewed" tubular esophagus. Esophageal mucosa appeared normal. Esophageal lumen patent throughout its course. Stomach empty. 4-5 cm hiatal hernia present;  otherwise, appeared normal. Patent pylorus. Normal appearing first, second and third portion of the duodenum.  THERAPEUTIC / DIAGNOSTIC MANEUVERS PERFORMED:  Biopsies of the second and third portion of the duodenum taken for histologic study.   COMPLICATIONS:  None  IMPRESSION:  Somewhat redundant or "corkscrew" esophagus. Hiatal hernia. Status post small bowel biopsy.  RECOMMENDATIONS:  Follow up on pathology. See colonoscopy report.    _______________________________ R. Roetta Sessions, MD  FACP [facility] eSigned:  R. Roetta Sessions, MD FACP [facility] 06/05/2012 12:58 PM     CC:

## 2012-06-09 ENCOUNTER — Encounter (HOSPITAL_COMMUNITY): Payer: Self-pay | Admitting: Internal Medicine

## 2012-06-15 ENCOUNTER — Encounter: Payer: Self-pay | Admitting: Internal Medicine

## 2012-06-16 ENCOUNTER — Encounter: Payer: Self-pay | Admitting: *Deleted

## 2012-06-16 ENCOUNTER — Other Ambulatory Visit: Payer: Self-pay

## 2012-06-16 ENCOUNTER — Telehealth: Payer: Self-pay | Admitting: Gastroenterology

## 2012-06-16 DIAGNOSIS — D509 Iron deficiency anemia, unspecified: Secondary | ICD-10-CM

## 2012-06-16 NOTE — Telephone Encounter (Signed)
I think she needs hp serologies(regardless of normal looking stomach ) and a capsule study of small bowel for complete work-up .Marland KitchenWhat do you think?   Raynelle Fanning: Please let pt know she will be receiving a letter regarding her results. Nothing noted to contribute to IDA or heme + stools. Per RMR, let's proceed with H.pylori serologies and capsule study. I have placed the orders for bloodwork.

## 2012-06-16 NOTE — Telephone Encounter (Signed)
I copied/pasted RMR's recommendations at the top of my original message. Was not intending to send that along with my second paragraph. Sorry!

## 2012-06-17 ENCOUNTER — Ambulatory Visit (INDEPENDENT_AMBULATORY_CARE_PROVIDER_SITE_OTHER): Payer: Medicare Other | Admitting: Psychiatry

## 2012-06-17 ENCOUNTER — Encounter (HOSPITAL_COMMUNITY): Payer: Self-pay | Admitting: Psychiatry

## 2012-06-17 DIAGNOSIS — F4310 Post-traumatic stress disorder, unspecified: Secondary | ICD-10-CM

## 2012-06-18 NOTE — Telephone Encounter (Signed)
Tried to call pt- Sabrina Morrison. Lab order faxed to lab.

## 2012-06-19 ENCOUNTER — Encounter (HOSPITAL_COMMUNITY): Payer: Self-pay | Admitting: Pharmacy Technician

## 2012-06-19 ENCOUNTER — Other Ambulatory Visit: Payer: Self-pay | Admitting: Internal Medicine

## 2012-06-19 ENCOUNTER — Telehealth: Payer: Self-pay

## 2012-06-19 NOTE — Patient Instructions (Signed)
Discussed orally 
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
## 2012-06-19 NOTE — Telephone Encounter (Signed)
Patient is scheduled for Capsule study on 1/23 at 7:30 and I have mailed her instructions and she is aware

## 2012-06-19 NOTE — Telephone Encounter (Signed)
Pt called- she had spoken with pharmacy at [facility] (they called her about her procedure) she is requesting a phenergan to take prior to her capsule study.

## 2012-06-19 NOTE — Telephone Encounter (Signed)
Pt aware, ok to set up capsule study.  Benedetto Goad, please schedule.

## 2012-06-24 NOTE — Telephone Encounter (Signed)
Pt would like the phenergan because she get nauseas when she goes to the hospital.

## 2012-06-24 NOTE — Telephone Encounter (Signed)
Can we find out why she is requesting Phenergan? There should not be anything about this that causes nausea. I'd like to avoid it if possible.

## 2012-06-25 MED ORDER — ONDANSETRON HCL 4 MG PO TABS: 4.0000 mg | ORAL_TABLET | Freq: Once | ORAL | Status: DC

## 2012-06-25 NOTE — Telephone Encounter (Signed)
I sent a prescription for one dose of Zofran.

## 2012-06-26 ENCOUNTER — Ambulatory Visit (HOSPITAL_COMMUNITY)
Admission: RE | Admit: 2012-06-26 | Discharge: 2012-06-26 | Disposition: A | Discharge status: Home / Self Care | Payer: Medicare Other | Source: Ambulatory Visit | Attending: Internal Medicine | Admitting: Internal Medicine

## 2012-06-26 ENCOUNTER — Encounter (HOSPITAL_COMMUNITY)
Admission: RE | Disposition: A | Discharge status: Home / Self Care | Payer: Self-pay | Source: Ambulatory Visit | Attending: Internal Medicine

## 2012-06-26 ENCOUNTER — Telehealth: Payer: Self-pay | Admitting: Internal Medicine

## 2012-06-26 ENCOUNTER — Encounter (HOSPITAL_COMMUNITY): Payer: Self-pay | Admitting: *Deleted

## 2012-06-26 DIAGNOSIS — R195 Other fecal abnormalities: Secondary | ICD-10-CM

## 2012-06-26 DIAGNOSIS — D509 Iron deficiency anemia, unspecified: Secondary | ICD-10-CM

## 2012-06-26 DIAGNOSIS — K6389 Other specified diseases of intestine: Secondary | ICD-10-CM

## 2012-06-26 DIAGNOSIS — K921 Melena: Secondary | ICD-10-CM | POA: Insufficient documentation

## 2012-06-26 DIAGNOSIS — Z139 Encounter for screening, unspecified: Secondary | ICD-10-CM

## 2012-06-26 HISTORY — PX: GIVENS CAPSULE STUDY: SHX5432

## 2012-06-26 SURGERY — IMAGING PROCEDURE, GI TRACT, INTRALUMINAL, VIA CAPSULE

## 2012-06-26 NOTE — Telephone Encounter (Signed)
Pt is at the hospital now.

## 2012-06-26 NOTE — Telephone Encounter (Signed)
Dr Gean Quint was scheduled for [facility] Capsule Study this morning 06/26/12 and it will be ready to read on Friday 06/27/12

## 2012-06-30 ENCOUNTER — Encounter (HOSPITAL_COMMUNITY): Payer: Self-pay | Admitting: Internal Medicine

## 2012-06-30 NOTE — Op Note (Signed)
NAME:  Sabrina Morrison, Sabrina Morrison               ACCOUNT NO.:  [id number]  MEDICAL RECORD NO.:  [id number]  LOCATION:  [facility]                          FACILITY:  [facility]  PHYSICIAN:  R. Roetta Sessions, MD FACP FACGDATE OF BIRTH:  10-16-1930  DATE OF PROCEDURE:  06/26/2012 DATE OF DISCHARGE:  06/26/2012                              OPERATIVE REPORT   INDICATIONS FOR PROCEDURE:  A 79 year old with new onset iron-deficiency anemia and Hemoccult positive stool.  Recent EGD demonstrated a hiatal hernia.  Small bowel biopsies negative for celiac disease.  Recent colonoscopy demonstrated diverticulosis and adenomatous polyp.  H. pylori serologies are negative.  It is interesting the patient tells me she has had at least 6 significant episodes of epistaxis over the past 12 months, but has not brought this problem to the attention of any of her providers.  In addition, she has been on high-dose acid suppression therapy in a way of pantoprazole 30 mg orally twice daily.  VIDEO CAPSULE FINDINGS: 1. First gastric image acquired at 2 minutes and 50 seconds. 2. First duodenal image acquired at 1 hour and 23 minutes. 3. First ileocecal valve image acquired at 7 hours and 11 minutes. 4. First cecal image acquired at 7 hours and 41 minutes. 5. The patient was found to have a couple of superficial erosions in     the mid small intestine at 3 hours and 35 minutes and 4 hours and     22 minutes. 6. There was no evidence of tumor or blood anywhere in the small     intestine.  IMPRESSION:  Probable 2 superficial erosions in the mid small bowel; no evidence of tumor or blood anywhere in the small intestine.  It is interesting her report of multiple episodes of epistaxis over the past 1 year.  In addition, it is possible that high dose proton pump inhibitor therapy maybe diminishing iron absorption.  I have discussed my findings and recommendations with [name] over the weekend via telephone.  She tells me she  is going to see Dr. Reuel Boom next month and will have blood work done a couple of weeks from now.  I have advised her to drop back on the lansoprazole to 30 mg once daily or even drop back to more i.e., 30 mg every other day for the next few months to see how her iron-deficiency anemia will behave.  Full range follow up with me in 3 months.     Jonathon Bellows, MD Caleen Essex     RMR/MEDQ  D:  06/30/2012  T:  06/30/2012  Job:  [id number]  cc:   Donzetta Sprung, MD Fax: [phone]

## 2012-07-01 NOTE — Progress Notes (Signed)
Quick Note:  H.pylori serology negative. Capsule study has been performed, final results pending after review by Dr. Jena Gauss.  ______

## 2012-07-15 ENCOUNTER — Ambulatory Visit (HOSPITAL_COMMUNITY): Payer: Self-pay | Admitting: Psychiatry

## 2012-07-15 ENCOUNTER — Ambulatory Visit (INDEPENDENT_AMBULATORY_CARE_PROVIDER_SITE_OTHER): Payer: Medicare Other | Admitting: Psychiatry

## 2012-07-15 DIAGNOSIS — F4310 Post-traumatic stress disorder, unspecified: Secondary | ICD-10-CM
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
## 2012-07-16 NOTE — Patient Instructions (Signed)
Discussed orally 

## 2012-07-28 ENCOUNTER — Telehealth: Payer: Self-pay | Admitting: Internal Medicine

## 2012-07-28 NOTE — Telephone Encounter (Signed)
Pt had procedure done on 06/26/12 and has had cramping,bloating and diarrhea since. Please advise and call [phone]

## 2012-07-29 ENCOUNTER — Telehealth: Payer: Self-pay | Admitting: *Deleted

## 2012-07-29 MED ORDER — DICYCLOMINE HCL 10 MG PO CAPS: 10.0000 mg | ORAL_CAPSULE | Freq: Three times a day (TID) | ORAL | Status: DC

## 2012-07-29 NOTE — Telephone Encounter (Signed)
Spoke with pt- she is having a lot of diarrhea, cramping and gas. Small amount of nausea. No vomiting, no blood in stool and no fever. Pt said she cannot control the diarrhea, she is taking immodium and its not helping. She stated all symptoms were worse after eating and she was watching her diet.   Pt wants to know if there is anything else she can try to help stop the diarrhea, cramping and gas. Please advise.

## 2012-07-29 NOTE — Telephone Encounter (Signed)
Spoke with pt

## 2012-07-29 NOTE — Telephone Encounter (Signed)
Any antibiotic exposure recently?Marland Kitchen We can call in Bentyl 10 mg tablets. Dispense #30- one orally before meals and at bedtime as needed for diarrhea - no refills. Bland diet for the next couple of days. Call if no better if no better in about 48 hours

## 2012-07-29 NOTE — Telephone Encounter (Signed)
Pt aware, no recent antibiotics. rx sent to [facility] pharmacy.

## 2012-07-29 NOTE — Telephone Encounter (Signed)
Ms Sabrina Morrison called again today. Please see yesterdays phone note and call her back.  Thank you.

## 2012-08-13 ENCOUNTER — Telehealth: Payer: Self-pay | Admitting: *Deleted

## 2012-08-13 NOTE — Telephone Encounter (Signed)
Spoke with pt- the dicyclomine is making her very sleepy, it hasnt helped with the diarrhea at all. Pt still has cramps, bloating and diarrhea. She has tried gas-x and phazyme and neither has helped with the gas. She stated she has had this ever since the tcs. She wants to know if there is anything else she can do. Please advise.

## 2012-08-13 NOTE — Telephone Encounter (Signed)
Sabrina Morrison called today. She is having some trouble with the medication that they have put her on. She is very sleepy all the time. The medication is dicyclomine. Please call her back. Thank you.

## 2012-08-14 ENCOUNTER — Other Ambulatory Visit: Payer: Self-pay

## 2012-08-14 NOTE — Addendum Note (Signed)
Addended by: Myra Rude on: 08/14/2012 01:48 PM   Modules accepted: Orders

## 2012-08-14 NOTE — Telephone Encounter (Signed)
Let's send a stool sample for C. difficile PCR.  If negative, will need TTG antibody and a serum IgA if that has not been done recently. Also, advised patient to stop the bentyl.  Use Imodium for now.  Further recommendations to follow

## 2012-08-14 NOTE — Addendum Note (Signed)
Addended by: Myra Rude on: 08/14/2012 01:52 PM   Modules accepted: Orders

## 2012-08-14 NOTE — Telephone Encounter (Signed)
Pt aware. Lab orders and container at the front desk.

## 2012-08-22 ENCOUNTER — Other Ambulatory Visit: Payer: Self-pay

## 2012-08-22 ENCOUNTER — Other Ambulatory Visit: Payer: Self-pay | Admitting: Internal Medicine

## 2012-08-22 DIAGNOSIS — R197 Diarrhea, unspecified: Secondary | ICD-10-CM

## 2012-08-23 LAB — IGA: IgA: 209 mg/dL (ref 69–380)

## 2012-08-25 LAB — TISSUE TRANSGLUTAMINASE, IGA: Tissue Transglutaminase Ab, IgA: 4.4 U/mL (ref <20)

## 2012-08-26 ENCOUNTER — Ambulatory Visit (HOSPITAL_COMMUNITY): Payer: Self-pay

## 2012-09-04 ENCOUNTER — Ambulatory Visit (HOSPITAL_COMMUNITY)
Admission: RE | Admit: 2012-09-04 | Discharge: 2012-09-04 | Disposition: A | Discharge status: Home / Self Care | Payer: Medicare Other | Source: Ambulatory Visit | Attending: Internal Medicine | Admitting: Internal Medicine

## 2012-09-04 DIAGNOSIS — Z1231 Encounter for screening mammogram for malignant neoplasm of breast: Secondary | ICD-10-CM | POA: Insufficient documentation

## 2012-09-09 ENCOUNTER — Ambulatory Visit (HOSPITAL_COMMUNITY): Payer: Self-pay | Admitting: Psychiatry

## 2012-09-23 ENCOUNTER — Ambulatory Visit (HOSPITAL_COMMUNITY): Payer: Self-pay | Admitting: Psychiatry

## 2012-09-30 ENCOUNTER — Encounter: Payer: Self-pay | Admitting: Internal Medicine

## 2012-09-30 ENCOUNTER — Ambulatory Visit (INDEPENDENT_AMBULATORY_CARE_PROVIDER_SITE_OTHER): Payer: Medicare Other | Admitting: Internal Medicine

## 2012-09-30 VITALS — BP 149/72 | HR 72 | Temp 97.4°F | Ht 65.0 in | Wt 174.0 lb

## 2012-09-30 DIAGNOSIS — R140 Abdominal distension (gaseous): Secondary | ICD-10-CM

## 2012-09-30 DIAGNOSIS — D509 Iron deficiency anemia, unspecified: Secondary | ICD-10-CM

## 2012-09-30 DIAGNOSIS — R142 Eructation: Secondary | ICD-10-CM

## 2012-09-30 DIAGNOSIS — K219 Gastro-esophageal reflux disease without esophagitis: Secondary | ICD-10-CM

## 2012-09-30 DIAGNOSIS — R141 Gas pain: Secondary | ICD-10-CM

## 2012-09-30 NOTE — Patient Instructions (Addendum)
Use digestive advantage for gas and bloating as your probiotic  Use GI cocktail only as needed  Need recent labs from Dr. Garner Nash office regarding B12 deficiency and anemia   Prevacid 30 mg daily  Office visit in 6 months

## 2012-09-30 NOTE — Progress Notes (Signed)
Primary Care Physician:  Donzetta Sprung, MD Primary Gastroenterologist:  Dr. Jena Gauss  Pre-Procedure History & Physical: HPI:  Sabrina Morrison is a 79 y.o. female here for followup of GERD, iron deficiency anemia and gas/ bloat symptoms. Recent EGD, capsule study and colonoscopy failed to reveal any significant pathology (negative HP biopsy, celiac disease. Simple adenoma removed from her colon) She is getting along with Prevacid 30 mg once daily in the hopes that backing off of acid suppression therapy from a BID regimen may enhance iron absorption. She has received parenteral iron. She read about Dulera and Qvar potentially causing some of her GI symptoms and she stopped these inhalers on her own.. Overall she feels much better these days. Not having much in way of any active GI issues aside from occasional gas bloat.  Recent bout of diarrhea resolved on its own. C. difficile negative through this office.  Past Medical History  Diagnosis Date  . Allergic rhinitis   . Asthma   . GERD (gastroesophageal reflux disease)   . Hypothyroidism   . Gastroparesis 08/2008    mild, 35% retention at 2 hours (normal is less than 30% at 2 hours)  . Schatzki's ring     h/o 8 EGDs  . Irritable bowel syndrome   . Shortness of breath   . PONV (postoperative nausea and vomiting)   . Cancer     skin cancer form left arm  . Hiatal hernia 09/01/2004    large  . Tubular adenoma   . Hyperplastic colon polyp   . IDA (iron deficiency anemia)     Past Surgical History  Procedure Laterality Date  . S/p hysterectomy    . Segmental colectomy      [place] in [place]  . Esophagogastroduodenoscopy  8/08    RMR: cork-screwed esophagus,prominent Schatki's ring large hh  . Colonoscopy  09/01/2004    RMR: normal, due 08/2014  . Esophagogastroduodenoscopy  04/18/10    prominent schatzki's ring/large hiatal hernia  otherwise normal stomach  . Abdominal hysterectomy    . Colon surgery    . Dilation  and curettage of uterus      x2  . Arm skin lesion biopsy / excision      left arm  . Cataract extraction w/phaco  08/27/2011    Procedure: CATARACT EXTRACTION PHACO AND INTRAOCULAR LENS PLACEMENT (IOC);  Surgeon: Gemma Payor, MD;  Location: [facility];  Service: Ophthalmology;  Laterality: Right;  CDE: 15.12  . Esophagogastroduodenoscopy   09/25/2002    RMR: Prominent Schatski's ring, foreshortened esophagus, moderate size hiatal hernia.  The remainder of the stomach and duodenum through the second portion appeared normal.  Status post balloon dilation of ring,  . Esophagogastroduodenoscopy  01/28/2004    [id number] ring status post dilation as described above. Otherwise normal/ Large hiatal hernia  . Esophagogastroduodenoscopy  09/01/2004    RMR: Schatzki's ring, otherwise normal esophagus status post dilation/Moderately large hiatal hernia  . Esophagogastroduodenoscopy   12/14/2005    RMR: Schatzki's ring, otherwise normal esophagus status post dilation /Large hiatal hernia  . Colonoscopy with esophagogastroduodenoscopy (egd)  06/05/2012    Dr. Jena Gauss- EGD- "corkscrewed" tubular esophagus.  hiatal hernia beign bx. TCS- colonic divertiulosis, tubular adenoma, hyperplastic polyp  . Givens capsule study  06/26/2012    Dr. Jena Gauss- probable 2 superficial erosions in the mid small bowel, no evidence of tumor or blood anywhere in the small intestine.    Prior to Admission medications   Medication Sig Start Date End  Date Taking? Authorizing Provider  acetaminophen (TYLENOL) 500 MG tablet Take 500 mg by mouth every 6 (six) hours as needed. pain   Yes Historical Provider, MD  Alpha-D-Galactosidase (BEANO PO) Take by mouth daily.   Yes Historical Provider, MD  diphenoxylate-atropine (LOMOTIL) 2.5-0.025 MG per tablet Take 1 tablet by mouth 4 (four) times daily as needed. diarrhea   Yes Historical Provider, MD  EPIPEN 2-PAK 0.3 MG/0.3ML DEVI Inject 0.3 mg as directed as needed. Allergic Reaction 02/26/12  Yes  Historical Provider, MD  levothyroxine (SYNTHROID, LEVOTHROID) 100 MCG tablet Take 100 mcg by mouth daily before breakfast.  09/24/12  Yes Historical Provider, MD  prochlorperazine (COMPAZINE) 5 MG tablet Take 5 mg by mouth every 6 (six) hours as needed. Nausea,vomiting   Yes Historical Provider, MD  Simethicone (GAS-X PO) Take 180 mg by mouth 4 (four) times daily as needed.    Yes Historical Provider, MD  Alum & Mag Hydroxide-Simeth (GI COCKTAIL) SUSP suspension Take 30 mLs by mouth daily as needed. Shake well.    Historical Provider, MD  beclomethasone (QVAR) 80 MCG/ACT inhaler Inhale 2 puffs into the lungs 2 (two) times daily. Shortness of breath    Historical Provider, MD  ciprofloxacin-dexamethasone (CIPRODEX) otic suspension Place 1 drop into the left ear daily as needed.     Historical Provider, MD  Cranberry 500 MG CAPS Take 500 mg by mouth daily.    Historical Provider, MD  cyanocobalamin (,VITAMIN B-12,) 1000 MCG/ML injection Inject 1,000 mcg into the muscle every 14 (fourteen) days.    Historical Provider, MD  dicyclomine (BENTYL) 10 MG capsule Take 1 capsule (10 mg total) by mouth 4 (four) times daily -  before meals and at bedtime. 07/29/12 07/29/13  Corbin Ade, MD  DULERA 200-5 MCG/ACT AERO Inhale 2 puffs into the lungs 2 (two) times daily.  04/09/12   Historical Provider, MD  fluticasone (FLONASE) 50 MCG/ACT nasal spray Place 2 sprays into the nose daily as needed. Congestion    Historical Provider, MD  lansoprazole (PREVACID) 30 MG capsule Take 30 mg by mouth 2 (two) times daily.     Historical Provider, MD  ondansetron (ZOFRAN) 4 MG tablet Take 1 tablet (4 mg total) by mouth once. Prior to procedure. 06/25/12   Nira Retort, NP    Allergies as of 09/30/2012 - Review Complete 09/30/2012  Allergen Reaction Noted  . Codeine Nausea And Vomiting   . Coumadin (warfarin)  06/19/2012  . Dexlansoprazole  04/05/2010  . Erythromycin ethylsuccinate  04/05/2010  . Esomeprazole magnesium  Nausea And Vomiting   . Moxifloxacin Nausea And Vomiting 04/05/2010  . Omeprazole  04/05/2010  . Sulfonamide derivatives Nausea And Vomiting     Family History  Problem Relation Age of Onset  . Heart attack Father 17    deceased  . Colon cancer Neg Hx   . Anesthesia problems Neg Hx   . Hypotension Neg Hx   . Malignant hyperthermia Neg Hx   . Pseudochol deficiency Neg Hx     History   Social History  . Marital Status: Widowed    Spouse Name: N/A    Number of Children: 2  . Years of Education: N/A   Occupational History  . taking computer classes    Social History Main Topics  . Smoking status: Never Smoker   . Smokeless tobacco: Never Used  . Alcohol Use: No  . Drug Use: No  . Sexually Active: No   Other Topics Concern  . Not  on file   Social History Narrative  . No narrative on file    Review of Systems: See HPI, otherwise negative ROS  Physical Exam: BP 149/72  Pulse 72  Temp(Src) 97.4 F (36.3 C) (Oral)  Ht 5\' 5"  (1.651 m)  Wt 174 lb (78.926 kg)  BMI 28.96 kg/m2 General:   Alert,  Well-developed, well-nourished, pleasant and cooperative in NAD Skin:  Intact without significant lesions or rashes. Eyes:  Sclera clear, no icterus.   Conjunctiva pink. Ears:  Normal auditory acuity. Nose:  No deformity, discharge,  or lesions. Mouth:  No deformity or lesions. Neck:  Supple; no masses or thyromegaly. No significant cervical adenopathy. Lungs:  Clear throughout to auscultation.   No wheezes, crackles, or rhonchi. No acute distress. Heart:  Regular rate and rhythm; no murmurs, clicks, rubs,  or gallops. Abdomen: Non-distended, normal bowel sounds.  Soft and nontender without appreciable mass or hepatosplenomegaly.  Pulses:  Normal pulses noted. Extremities:  Without clubbing or edema.  Impression/Plan:  Pleasant 79 year old lady with recent history of iron deficiency anemia. She's had a thorough GI evaluation including EGD, capsule study and colonoscopy.  Serologies negative for celiac disease as well as H. pylori. She did back off on her acid suppression therapy to Prevacid 30 mg orally once daily. By her report, her anemia profile looks much better. I very much like to see those labs done at Dr. Garner Nash office. Her only GI complaint these days is gas bloating occasionally after she eats a big meal. I would like her to limit her GI cocktail use to only absolutely when needed.  Recommendations today:      She should continue Prevacid 30 mg daily. I've asked her to utilize a specific probiotic i.e. digestive advantage for gas bloat on a daily basis to see how this works for gas bloat symptoms.   Use digestive advantage for gas and bloating as your probiotic  Use GI cocktail only as needed  Need recent labs from Dr. Garner Nash office regarding B12 deficiency and anemia   Prevacid 30 mg daily  Office visit in 6 months  I would like to retrieve the labs done at Dr. Garner Nash office for review in the near future. She should have  one more colonoscopy in 5 years only if her overall health permits.  Plan for an office visit in 4 months

## 2012-10-03 ENCOUNTER — Ambulatory Visit (INDEPENDENT_AMBULATORY_CARE_PROVIDER_SITE_OTHER): Payer: Medicare Other | Admitting: Psychiatry

## 2012-10-03 DIAGNOSIS — F4310 Post-traumatic stress disorder, unspecified: Secondary | ICD-10-CM

## 2012-10-03 NOTE — Patient Instructions (Signed)
Discussed orally 
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
## 2012-10-07 ENCOUNTER — Encounter: Payer: Self-pay | Admitting: Internal Medicine

## 2012-10-09 DIAGNOSIS — R011 Cardiac murmur, unspecified: Secondary | ICD-10-CM

## 2012-10-14 ENCOUNTER — Telehealth: Payer: Self-pay | Admitting: Surgery

## 2012-10-14 NOTE — Telephone Encounter (Signed)
Gave patient and Ann at Dr. Garner Nash' ofc the appt info, mailed letter - Marylu Lund

## 2012-10-15 ENCOUNTER — Other Ambulatory Visit: Payer: Self-pay | Admitting: *Deleted

## 2012-10-28 ENCOUNTER — Ambulatory Visit (HOSPITAL_COMMUNITY): Payer: Self-pay | Admitting: Psychiatry

## 2012-11-07 ENCOUNTER — Encounter: Payer: Self-pay | Admitting: Surgery

## 2012-11-07 ENCOUNTER — Ambulatory Visit (HOSPITAL_COMMUNITY): Payer: Self-pay | Admitting: Psychiatry

## 2012-11-10 ENCOUNTER — Other Ambulatory Visit: Payer: Self-pay

## 2012-11-10 ENCOUNTER — Other Ambulatory Visit: Payer: Self-pay | Admitting: *Deleted

## 2012-11-10 ENCOUNTER — Ambulatory Visit (INDEPENDENT_AMBULATORY_CARE_PROVIDER_SITE_OTHER): Payer: Medicare Other | Admitting: Surgery

## 2012-11-10 ENCOUNTER — Encounter: Payer: Self-pay | Admitting: Surgery

## 2012-11-10 ENCOUNTER — Other Ambulatory Visit (INDEPENDENT_AMBULATORY_CARE_PROVIDER_SITE_OTHER): Payer: Medicare Other | Admitting: *Deleted

## 2012-11-10 DIAGNOSIS — I6529 Occlusion and stenosis of unspecified carotid artery: Secondary | ICD-10-CM | POA: Insufficient documentation

## 2012-11-10 NOTE — Progress Notes (Signed)
[facility]   Patient name: Sabrina Morrison MRN: [id number] DOB: 08-10-1930 Sex: female   Referred by: Dr. Reuel Boom  Reason for referral:  Chief Complaint  Patient presents with  . New Evaluation    Carotid with vascular lab study.  Ref by Dr. Donzetta Sprung    HISTORY OF PRESENT ILLNESS: The patient comes in today for evaluation of her carotid arteries. On may 7, she had 2 episodes of headaches, nausea, and dizziness. She nearly passed out. She denies having any symptoms since that time. She underwent a carotid duplex study which revealed a greater than 70% left carotid stenosis. Currently she reports no further episodes of numbness or weakness, slurred speech, or amaurosis fugax. She is now taking an aspirin.  The patient reports having had a peak stroke many years ago. She has a history of hypothyroidism and has had her thyroid removed. She is been on Synthroid since 1980. She has gastroesophageal reflux disease which is treated with a PPI. She also feels with fibromyalgia and asthma which are medically managed. She does not have a smoking history  Past Medical History  Diagnosis Date  . Allergic rhinitis   . Asthma   . GERD (gastroesophageal reflux disease)   . Hypothyroidism   . Gastroparesis 08/2008    mild, 35% retention at 2 hours (normal is less than 30% at 2 hours)  . Schatzki's ring     h/o 8 EGDs  . Irritable bowel syndrome   . Shortness of breath   . PONV (postoperative nausea and vomiting)   . Cancer     skin cancer form left arm  . Hiatal hernia 09/01/2004    large  . Tubular adenoma   . Hyperplastic colon polyp   . IDA (iron deficiency anemia)     Past Surgical History  Procedure Laterality Date  . S/p hysterectomy    . Segmental colectomy      with incidental append[place] in [place]  . Esophagogastroduodenoscopy  8/08    RMR: cork-screwed esophagus,prominent Schatki's ring large hh  . Colonoscopy  09/01/2004    RMR: normal, due  08/2014  . Esophagogastroduodenoscopy  04/18/10    prominent schatzki's ring/large hiatal hernia  otherwise normal stomach  . Abdominal hysterectomy    . Colon surgery    . Dilation and curettage of uterus      x2  . Arm skin lesion biopsy / excision      left arm  . Cataract extraction w/phaco  08/27/2011    Procedure: CATARACT EXTRACTION PHACO AND INTRAOCULAR LENS PLACEMENT (IOC);  Surgeon: Gemma Payor, MD;  Location: [facility];  Service: Ophthalmology;  Laterality: Right;  CDE: 15.12  . Esophagogastroduodenoscopy   09/25/2002    RMR: Prominent Schatski's ring, foreshortened esophagus, moderate size hiatal hernia.  The remainder of the stomach and duodenum through the second portion appeared normal.  Status post balloon dilation of ring,  . Esophagogastroduodenoscopy  01/28/2004    [id number] ring status post dilation as described above. Otherwise normal/ Large hiatal hernia  . Esophagogastroduodenoscopy  09/01/2004    RMR: Schatzki's ring, otherwise normal esophagus status post dilation/Moderately large hiatal hernia  . Esophagogastroduodenoscopy   12/14/2005    RMR: Schatzki's ring, otherwise normal esophagus status post dilation /Large hiatal hernia  . Colonoscopy with esophagogastroduodenoscopy (egd)  06/05/2012    Dr. Jena Gauss- EGD- "corkscrewed" tubular esophagus.  hiatal hernia beign bx. TCS- colonic divertiulosis, tubular adenoma, hyperplastic polyp  . Givens capsule study  06/26/2012  Dr. Jena Gauss- probable 2 superficial erosions in the mid small bowel, no evidence of tumor or blood anywhere in the small intestine.    History   Social History  . Marital Status: Widowed    Spouse Name: N/A    Number of Children: 2  . Years of Education: N/A   Occupational History  . taking computer classes    Social History Main Topics  . Smoking status: Never Smoker   . Smokeless tobacco: Never Used  . Alcohol Use: No  . Drug Use: No  . Sexually Active: No   Other Topics Concern  . Not  on file   Social History Narrative  . No narrative on file    Family History  Problem Relation Age of Onset  . Heart attack Father 55    deceased  . Colon cancer Neg Hx   . Anesthesia problems Neg Hx   . Hypotension Neg Hx   . Malignant hyperthermia Neg Hx   . Pseudochol deficiency Neg Hx     Allergies as of 11/10/2012 - Review Complete 11/10/2012  Allergen Reaction Noted  . Codeine Nausea And Vomiting   . Coumadin (warfarin)  06/19/2012  . Dexlansoprazole  04/05/2010  . Erythromycin ethylsuccinate  04/05/2010  . Esomeprazole magnesium Nausea And Vomiting   . Moxifloxacin Nausea And Vomiting 04/05/2010  . Omeprazole  04/05/2010  . Sulfonamide derivatives Nausea And Vomiting     Current Outpatient Prescriptions on File Prior to Visit  Medication Sig Dispense Refill  . acetaminophen (TYLENOL) 500 MG tablet Take 500 mg by mouth every 6 (six) hours as needed. pain      . Alpha-D-Galactosidase (BEANO PO) Take by mouth daily.      . Alum & Mag Hydroxide-Simeth (GI COCKTAIL) SUSP suspension Take 30 mLs by mouth daily as needed. Shake well.      . cyanocobalamin (,VITAMIN B-12,) 1000 MCG/ML injection Inject 1,000 mcg into the muscle every 14 (fourteen) days.      . diphenoxylate-atropine (LOMOTIL) 2.5-0.025 MG per tablet Take 1 tablet by mouth 4 (four) times daily as needed. diarrhea      . EPIPEN 2-PAK 0.3 MG/0.3ML DEVI Inject 0.3 mg as directed as needed. Allergic Reaction      . fluticasone (FLONASE) 50 MCG/ACT nasal spray Place 2 sprays into the nose daily as needed. Congestion      . levothyroxine (SYNTHROID, LEVOTHROID) 100 MCG tablet Take 100 mcg by mouth daily before breakfast.       . prochlorperazine (COMPAZINE) 5 MG tablet Take 5 mg by mouth every 6 (six) hours as needed. Nausea,vomiting      . Simethicone (GAS-X PO) Take 180 mg by mouth 4 (four) times daily as needed.       . beclomethasone (QVAR) 80 MCG/ACT inhaler Inhale 2 puffs into the lungs 2 (two) times daily.  Shortness of breath      . ciprofloxacin-dexamethasone (CIPRODEX) otic suspension Place 1 drop into the left ear daily as needed.       . Cranberry 500 MG CAPS Take 500 mg by mouth daily.      Marland Kitchen dicyclomine (BENTYL) 10 MG capsule Take 1 capsule (10 mg total) by mouth 4 (four) times daily -  before meals and at bedtime.  30 capsule  0  . DULERA 200-5 MCG/ACT AERO Inhale 2 puffs into the lungs 2 (two) times daily.       . lansoprazole (PREVACID) 30 MG capsule Take 30 mg by mouth 2 (two) times  daily.       . ondansetron (ZOFRAN) 4 MG tablet Take 1 tablet (4 mg total) by mouth once. Prior to procedure.  1 tablet  0  . [DISCONTINUED] calcium carbonate (OS-CAL) 600 MG TABS Take 600 mg by mouth 2 (two) times daily with a meal.         No current facility-administered medications on file prior to visit.     REVIEW OF SYSTEMS: Cardiovascular: No chest pain, palpitations, orthopnea, or dyspnea on exertion. No claudication or rest pain,  No history of DVT or phlebitis. Positive for chest pressure Pulmonary: Positive for asthma  Neurologic: Positive for dizziness  Hematologic: No bleeding problems or clotting disorders. Musculoskeletal: No joint pain or joint swelling. Gastrointestinal: No blood in stool or hematemesis Genitourinary: No dysuria or hematuria. Psychiatric:: No history of major depression. Integumentary: No rashes or ulcers. Constitutional: No fever or chills.  PHYSICAL EXAMINATION: General: The patient appears their stated age.  Vital signs are BP 166/60  Pulse 56  Resp 16  Ht 5\' 5"  (1.651 m)  Wt 170 lb (77.111 kg)  BMI 28.29 kg/m2  SpO2 97% HEENT:  No gross abnormalities Pulmonary: Respirations are non-labored Musculoskeletal: There are no major deformities.   Neurologic: No focal weakness or paresthesias are detected, Skin: There are no ulcer or rashes noted. Psychiatric: The patient has normal affect. Cardiovascular: There is a regular rate and rhythm without significant  murmur appreciated. No carotid bruit  Diagnostic Studies: Duplex ultrasound performed in our office today shows less than 40% bilateral carotid stenosis  Outside Studies/Documentation Historical records were reviewed.  They showed greater than 70% left carotid stenosis  Medication Changes: None  Assessment:  Carotid stenosis Plan: The outside carotid duplex does not correlate with the study performed in our lab. In reviewing the outside study I suspect that the greater than 70% stenosis was based on a systolic velocity. Her diastolic velocity does not put her in this category. However, because we had 2 very different ultrasounds I suggested getting a confirmatory test to determine whether or not she has significant carotid stenosis which would put her at risk for stroke, and could potentially explain her prior episode. The patient would like to delay this study until the end of June. I have scheduled her for a CT angiogram of the neck and followup with me on June 30. I've encouraged her to continue taking her aspirin. She will contact me if any further episodes occur.     Jorge Ny, M.D. [facility] Office: [phone] Pager:  [phone]

## 2012-11-28 ENCOUNTER — Ambulatory Visit (INDEPENDENT_AMBULATORY_CARE_PROVIDER_SITE_OTHER): Payer: No Typology Code available for payment source | Admitting: Psychiatry

## 2012-11-28 DIAGNOSIS — F4310 Post-traumatic stress disorder, unspecified: Secondary | ICD-10-CM
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
## 2012-12-02 NOTE — Patient Instructions (Signed)
Discussed orally 

## 2012-12-08 ENCOUNTER — Other Ambulatory Visit: Payer: Self-pay

## 2012-12-08 ENCOUNTER — Ambulatory Visit: Payer: Self-pay | Admitting: Surgery

## 2012-12-22 ENCOUNTER — Ambulatory Visit (HOSPITAL_COMMUNITY): Payer: Self-pay | Admitting: Psychiatry

## 2013-01-12 ENCOUNTER — Ambulatory Visit (INDEPENDENT_AMBULATORY_CARE_PROVIDER_SITE_OTHER): Payer: No Typology Code available for payment source | Admitting: Psychiatry

## 2013-01-12 DIAGNOSIS — F4310 Post-traumatic stress disorder, unspecified: Secondary | ICD-10-CM

## 2013-01-13 NOTE — Patient Instructions (Signed)
Discussed orally 
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
## 2013-02-09 ENCOUNTER — Ambulatory Visit (HOSPITAL_COMMUNITY): Payer: Self-pay | Admitting: Psychiatry

## 2013-02-16 ENCOUNTER — Ambulatory Visit (INDEPENDENT_AMBULATORY_CARE_PROVIDER_SITE_OTHER): Payer: No Typology Code available for payment source | Admitting: Psychiatry

## 2013-02-16 DIAGNOSIS — F4310 Post-traumatic stress disorder, unspecified: Secondary | ICD-10-CM
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
## 2013-02-16 NOTE — Patient Instructions (Signed)
Discussed orally 

## 2013-03-20 ENCOUNTER — Ambulatory Visit (HOSPITAL_COMMUNITY): Payer: Self-pay | Admitting: Psychiatry

## 2013-04-03 ENCOUNTER — Ambulatory Visit (INDEPENDENT_AMBULATORY_CARE_PROVIDER_SITE_OTHER): Payer: No Typology Code available for payment source | Admitting: Psychiatry

## 2013-04-03 DIAGNOSIS — F4310 Post-traumatic stress disorder, unspecified: Secondary | ICD-10-CM

## 2013-04-03 NOTE — Patient Instructions (Signed)
Discussed orally 
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
## 2013-04-23 ENCOUNTER — Ambulatory Visit (HOSPITAL_COMMUNITY): Payer: Self-pay | Admitting: Psychiatry

## 2013-04-28 ENCOUNTER — Ambulatory Visit (INDEPENDENT_AMBULATORY_CARE_PROVIDER_SITE_OTHER): Payer: Medicare Other | Admitting: Internal Medicine

## 2013-04-28 ENCOUNTER — Encounter: Payer: Self-pay | Admitting: Internal Medicine

## 2013-04-28 ENCOUNTER — Encounter (INDEPENDENT_AMBULATORY_CARE_PROVIDER_SITE_OTHER): Payer: Self-pay

## 2013-04-28 VITALS — BP 135/68 | HR 71 | Temp 97.9°F | Wt 173.2 lb

## 2013-04-28 DIAGNOSIS — K219 Gastro-esophageal reflux disease without esophagitis: Secondary | ICD-10-CM

## 2013-04-28 DIAGNOSIS — K3184 Gastroparesis: Secondary | ICD-10-CM

## 2013-04-28 MED ORDER — RABEPRAZOLE SODIUM 20 MG PO TBEC: 20.0000 mg | DELAYED_RELEASE_TABLET | Freq: Every day | ORAL | Status: DC

## 2013-04-28 NOTE — Progress Notes (Signed)
Primary Care Physician:  Donzetta Sprung, MD Primary Gastroenterologist:  Dr. Jena Gauss  Pre-Procedure History & Physical: HPI:  Sabrina Morrison is a 79 y.o. female here for followup of gas bloat/GERD. Stopped  taking GI cocktail because it was too expensive. Started on Carafate suspension 1 g 4 times a day over the past one month-doing better. Read up on side effect profile of Prevacid  - stopped  taking this medication for fear of getting side effects (which she has not yet suffered).  Patient asked me about antireflux surgery. No dysphagia these days. Bowel function has been fairly normal without significant diarrhea or constipation.  Gastroparesis managed with diet.  She tells me Dr. Reuel Boom diagnosed her with fibromyalgia recently.   Past Medical History  Diagnosis Date  . Allergic rhinitis   . Asthma   . GERD (gastroesophageal reflux disease)   . Hypothyroidism   . Gastroparesis 08/2008    mild, 35% retention at 2 hours (normal is less than 30% at 2 hours)  . Schatzki's ring     h/o 8 EGDs  . Irritable bowel syndrome   . Shortness of breath   . PONV (postoperative nausea and vomiting)   . Cancer     skin cancer form left arm  . Hiatal hernia 09/01/2004    large  . Tubular adenoma   . Hyperplastic colon polyp   . IDA (iron deficiency anemia)     Past Surgical History  Procedure Laterality Date  . S/p hysterectomy    . Segmental colectomy      with incidental append[place] in [place]  . Esophagogastroduodenoscopy  8/08    RMR: cork-screwed esophagus,prominent Schatki's ring large hh  . Colonoscopy  09/01/2004    RMR: normal, due 08/2014  . Esophagogastroduodenoscopy  04/18/10    prominent schatzki's ring/large hiatal hernia  otherwise normal stomach  . Abdominal hysterectomy    . Colon surgery    . Dilation and curettage of uterus      x2  . Arm skin lesion biopsy / excision      left arm  . Cataract extraction w/phaco  08/27/2011    Procedure: CATARACT EXTRACTION PHACO AND  INTRAOCULAR LENS PLACEMENT (IOC);  Surgeon: Gemma Payor, MD;  Location: [facility];  Service: Ophthalmology;  Laterality: Right;  CDE: 15.12  . Esophagogastroduodenoscopy   09/25/2002    RMR: Prominent Schatski's ring, foreshortened esophagus, moderate size hiatal hernia.  The remainder of the stomach and duodenum through the second portion appeared normal.  Status post balloon dilation of ring,  . Esophagogastroduodenoscopy  01/28/2004    [id number] ring status post dilation as described above. Otherwise normal/ Large hiatal hernia  . Esophagogastroduodenoscopy  09/01/2004    RMR: Schatzki's ring, otherwise normal esophagus status post dilation/Moderately large hiatal hernia  . Esophagogastroduodenoscopy   12/14/2005    RMR: Schatzki's ring, otherwise normal esophagus status post dilation /Large hiatal hernia  . Colonoscopy with esophagogastroduodenoscopy (egd)  06/05/2012    Dr. Jena Gauss- EGD- "corkscrewed" tubular esophagus.  hiatal hernia beign bx. TCS- colonic divertiulosis, tubular adenoma, hyperplastic polyp  . Givens capsule study  06/26/2012    Dr. Jena Gauss- probable 2 superficial erosions in the mid small bowel, no evidence of tumor or blood anywhere in the small intestine.    Prior to Admission medications   Medication Sig Start Date End Date Taking? Authorizing Provider  acetaminophen (TYLENOL) 500 MG tablet Take 500 mg by mouth every 6 (six) hours as needed. pain   Yes Historical Provider, MD  Alpha-D-Galactosidase (BEANO PO) Take by mouth daily.   Yes Historical Provider, MD  beclomethasone (QVAR) 80 MCG/ACT inhaler Inhale 2 puffs into the lungs 2 (two) times daily. Shortness of breath   Yes Historical Provider, MD  CARAFATE 1 GM/10ML suspension Take 1 g by mouth as needed.  04/27/13  Yes Historical Provider, MD  ciprofloxacin-dexamethasone (CIPRODEX) otic suspension Place 1 drop into the left ear daily as needed.    Yes Historical Provider, MD  Cranberry 500 MG CAPS Take 500 mg by mouth  daily.   Yes Historical Provider, MD  cyanocobalamin (,VITAMIN B-12,) 1000 MCG/ML injection Inject 1,000 mcg into the muscle every 14 (fourteen) days.   Yes Historical Provider, MD  dicyclomine (BENTYL) 10 MG capsule Take 1 capsule (10 mg total) by mouth 4 (four) times daily -  before meals and at bedtime. 07/29/12 07/29/13 Yes Corbin Ade, MD  diphenoxylate-atropine (LOMOTIL) 2.5-0.025 MG per tablet Take 1 tablet by mouth 4 (four) times daily as needed. diarrhea   Yes Historical Provider, MD  DULERA 200-5 MCG/ACT AERO Inhale 2 puffs into the lungs 2 (two) times daily.  04/09/12  Yes Historical Provider, MD  EPIPEN 2-PAK 0.3 MG/0.3ML DEVI Inject 0.3 mg as directed as needed. Allergic Reaction 02/26/12  Yes Historical Provider, MD  fluticasone (FLONASE) 50 MCG/ACT nasal spray Place 2 sprays into the nose daily as needed. Congestion   Yes Historical Provider, MD  HYDROcodone-acetaminophen (NORCO/VICODIN) 5-325 MG per tablet as needed. 11/07/12  Yes Historical Provider, MD  lansoprazole (PREVACID) 30 MG capsule Take 30 mg by mouth 2 (two) times daily.    Yes Historical Provider, MD  levothyroxine (SYNTHROID, LEVOTHROID) 100 MCG tablet Take 100 mcg by mouth daily before breakfast.  09/24/12  Yes Historical Provider, MD  ondansetron (ZOFRAN) 4 MG tablet Take 1 tablet (4 mg total) by mouth once. Prior to procedure. 06/25/12  Yes Nira Retort, NP  prochlorperazine (COMPAZINE) 5 MG tablet Take 5 mg by mouth every 6 (six) hours as needed. Nausea,vomiting   Yes Historical Provider, MD  Simethicone (GAS-X PO) Take 180 mg by mouth 4 (four) times daily as needed.    Yes Historical Provider, MD    Allergies as of 04/28/2013 - Review Complete 04/28/2013  Allergen Reaction Noted  . Codeine Nausea And Vomiting   . Coumadin [warfarin]  06/19/2012  . Dexlansoprazole  04/05/2010  . Erythromycin ethylsuccinate  04/05/2010  . Esomeprazole magnesium Nausea And Vomiting   . Moxifloxacin Nausea And Vomiting 04/05/2010  .  Omeprazole  04/05/2010  . Sulfonamide derivatives Nausea And Vomiting     Family History  Problem Relation Age of Onset  . Heart attack Father 35    deceased  . Colon cancer Neg Hx   . Anesthesia problems Neg Hx   . Hypotension Neg Hx   . Malignant hyperthermia Neg Hx   . Pseudochol deficiency Neg Hx     History   Social History  . Marital Status: Widowed    Spouse Name: N/A    Number of Children: 2  . Years of Education: N/A   Occupational History  . taking computer classes    Social History Main Topics  . Smoking status: Never Smoker   . Smokeless tobacco: Never Used  . Alcohol Use: No  . Drug Use: No  . Sexual Activity: No   Other Topics Concern  . Not on file   Social History Narrative  . No narrative on file    Review of Systems: See  HPI, otherwise negative ROS  Physical Exam: BP 135/68  Pulse 71  Temp(Src) 97.9 F (36.6 C) (Oral)  Wt 173 lb 3.2 oz (78.563 kg) General:   Well-groomed, elderly , Alert,  Well-developed, well-nourished, pleasant and cooperative in NAD Detailed examination deferred   Impression:  Long-standing GERD/gastritis symptoms and pleasant 79 year old lady with a large hiatal hernia, borderline gastroparesis. I addressed her questions about antireflux surgery. She would be a poor candidate for multiple reasons( i.e. gastroparesis, advanced age, comorbidities). I advised her stay out of the operating room as much as possible.  She has  questionable intolerances to multiple PPI agents.  She tends to have functional overlay to many of her GI symptoms.   Recommendations:   Continue Carafate i gram 4 times daily  Stop prevacid; begin generic aciphex 20 mg daily  Telephone follow-up 1 month  Office follow-up 6 months

## 2013-04-28 NOTE — Patient Instructions (Signed)
Continue Carafate i gram 4 times daily  Stop prevacid; begin generic aciphex 20 mg daily  Telephone follow-up 1 month  Office follow-up 6 months

## 2013-05-04 ENCOUNTER — Telehealth: Payer: Self-pay | Admitting: Internal Medicine

## 2013-05-04 NOTE — Telephone Encounter (Signed)
Pt called to leave a message with RMR's nurse. She said that she needs a prescription called in for nausea. [phone]

## 2013-05-04 NOTE — Telephone Encounter (Signed)
May have Zofran 4 mg tablet #20 - one po every 6 hours as needed for nausea - no refills

## 2013-05-04 NOTE — Telephone Encounter (Signed)
Routing to RMR for review. 

## 2013-05-05 ENCOUNTER — Telehealth: Payer: Self-pay | Admitting: *Deleted

## 2013-05-05 MED ORDER — ONDANSETRON HCL 4 MG PO TABS: 4.0000 mg | ORAL_TABLET | Freq: Four times a day (QID) | ORAL | Status: DC | PRN

## 2013-05-05 NOTE — Telephone Encounter (Signed)
Pt is aware.  

## 2013-05-05 NOTE — Telephone Encounter (Signed)
Pt called stating she called [facility] again and they still have not got RX. Please advise [phone]

## 2013-05-05 NOTE — Telephone Encounter (Signed)
rx sent to the pharmacy. 

## 2013-05-05 NOTE — Addendum Note (Signed)
Addended by: Myra Rude on: 05/05/2013 09:06 AM   Modules accepted: Orders

## 2013-05-05 NOTE — Telephone Encounter (Signed)
Called [facility]- they do have rx and I called pt and LMOM

## 2013-05-11 ENCOUNTER — Ambulatory Visit (HOSPITAL_COMMUNITY): Payer: Self-pay | Admitting: Psychiatry

## 2013-06-02 ENCOUNTER — Ambulatory Visit (HOSPITAL_COMMUNITY): Payer: Self-pay | Admitting: Psychiatry

## 2013-08-11 ENCOUNTER — Telehealth (HOSPITAL_COMMUNITY): Payer: Self-pay | Admitting: *Deleted

## 2013-08-19 ENCOUNTER — Other Ambulatory Visit (HOSPITAL_COMMUNITY): Payer: Self-pay | Admitting: Family Medicine

## 2013-08-19 ENCOUNTER — Telehealth (HOSPITAL_COMMUNITY): Payer: Self-pay | Admitting: *Deleted

## 2013-08-19 DIAGNOSIS — Z1231 Encounter for screening mammogram for malignant neoplasm of breast: Secondary | ICD-10-CM

## 2013-09-08 ENCOUNTER — Ambulatory Visit (HOSPITAL_COMMUNITY): Payer: Self-pay

## 2013-09-09 ENCOUNTER — Encounter: Payer: Self-pay | Admitting: Internal Medicine

## 2013-09-11 ENCOUNTER — Encounter (HOSPITAL_COMMUNITY): Payer: Self-pay | Admitting: Psychiatry

## 2013-09-11 ENCOUNTER — Ambulatory Visit (HOSPITAL_COMMUNITY): Payer: Self-pay | Admitting: Psychiatry

## 2013-09-15 ENCOUNTER — Ambulatory Visit (HOSPITAL_COMMUNITY): Payer: Self-pay

## 2013-09-21 ENCOUNTER — Ambulatory Visit (HOSPITAL_COMMUNITY): Payer: Self-pay

## 2013-09-25 ENCOUNTER — Ambulatory Visit (HOSPITAL_COMMUNITY): Payer: Self-pay | Admitting: Psychiatry

## 2013-10-02 ENCOUNTER — Ambulatory Visit (HOSPITAL_COMMUNITY): Payer: Self-pay

## 2013-10-02 ENCOUNTER — Ambulatory Visit (HOSPITAL_COMMUNITY)
Admission: RE | Admit: 2013-10-02 | Discharge: 2013-10-02 | Disposition: A | Discharge status: Home / Self Care | Payer: Medicare HMO | Source: Ambulatory Visit | Attending: Family Medicine | Admitting: Family Medicine

## 2013-10-02 DIAGNOSIS — Z1231 Encounter for screening mammogram for malignant neoplasm of breast: Secondary | ICD-10-CM | POA: Insufficient documentation

## 2014-02-09 ENCOUNTER — Ambulatory Visit: Payer: Self-pay | Admitting: Gastroenterology

## 2014-02-15 ENCOUNTER — Encounter: Payer: Self-pay | Admitting: Internal Medicine

## 2014-03-05 ENCOUNTER — Ambulatory Visit: Payer: Self-pay | Admitting: Gastroenterology

## 2014-03-15 ENCOUNTER — Ambulatory Visit: Payer: Self-pay | Admitting: Gastroenterology

## 2014-03-24 ENCOUNTER — Ambulatory Visit: Payer: Self-pay | Admitting: Gastroenterology

## 2014-04-21 ENCOUNTER — Ambulatory Visit (INDEPENDENT_AMBULATORY_CARE_PROVIDER_SITE_OTHER): Payer: Medicare HMO | Admitting: Internal Medicine

## 2014-04-21 ENCOUNTER — Encounter: Payer: Self-pay | Admitting: Internal Medicine

## 2014-04-21 VITALS — BP 160/75 | HR 64 | Temp 97.8°F | Ht 65.0 in | Wt 165.0 lb

## 2014-04-21 DIAGNOSIS — K449 Diaphragmatic hernia without obstruction or gangrene: Secondary | ICD-10-CM

## 2014-04-21 DIAGNOSIS — K219 Gastro-esophageal reflux disease without esophagitis: Secondary | ICD-10-CM

## 2014-04-21 DIAGNOSIS — K3184 Gastroparesis: Secondary | ICD-10-CM

## 2014-04-21 NOTE — Progress Notes (Signed)
Primary Care Physician:  Gar Ponto, MD Primary Gastroenterologist:  Dr. Gala Romney  Pre-Procedure History & Physical: HPI:  Sabrina Morrison is a 80 y.o. female here for followup gastroparesis. Having some breakthrough symptoms particularly at night. Takes Prevacid daily now, Carafate and TUMS. No dysphagia. Extensive workup last year for iron deficiency anemia included EGD, colonoscopy and capsule study. Adenomatous polyp removed from her colon. Couple small erosions are duodenum. Significant epistaxis last year likely major contributing factor to deficiency anemia. Those symptoms have gotten better.  Past Medical History  Diagnosis Date  . Allergic rhinitis   . Asthma   . GERD (gastroesophageal reflux disease)   . Hypothyroidism   . Gastroparesis 08/2008    mild, 35% retention at 2 hours (normal is less than 30% at 2 hours)  . Schatzki's ring     h/o 8 EGDs  . Irritable bowel syndrome   . Shortness of breath   . PONV (postoperative nausea and vomiting)   . Cancer     skin cancer form left arm  . Hiatal hernia 09/01/2004    large  . Tubular adenoma   . Hyperplastic colon polyp   . IDA (iron deficiency anemia)     Past Surgical History  Procedure Laterality Date  . S/p hysterectomy    . Segmental colectomy      with incidental appendectomy in [place]  . Esophagogastroduodenoscopy  8/08    RMR: cork-screwed esophagus,prominent Schatki's ring large hh  . Colonoscopy  09/01/2004    RMR: normal, due 08/2014  . Esophagogastroduodenoscopy  04/18/10    prominent schatzki's ring/large hiatal hernia  otherwise normal stomach  . Abdominal hysterectomy    . Colon surgery    . Dilation and curettage of uterus      x2  . Arm skin lesion biopsy / excision      left arm  . Cataract extraction w/phaco  08/27/2011    Procedure: CATARACT EXTRACTION PHACO AND INTRAOCULAR LENS PLACEMENT (IOC);  Surgeon: Tonny Branch, MD;  Location: [facility];  Service: Ophthalmology;  Laterality: Right;  CDE:  15.12  . Esophagogastroduodenoscopy   09/25/2002    RMR: Prominent Schatski's ring, foreshortened esophagus, moderate size hiatal hernia.  The remainder of the stomach and duodenum through the second portion appeared normal.  Status post balloon dilation of ring,  . Esophagogastroduodenoscopy  01/28/2004    [id number] ring status post dilation as described above. Otherwise normal/ Large hiatal hernia  . Esophagogastroduodenoscopy  09/01/2004    RMR: Schatzki's ring, otherwise normal esophagus status post dilation/Moderately large hiatal hernia  . Esophagogastroduodenoscopy   12/14/2005    RMR: Schatzki's ring, otherwise normal esophagus status post dilation /Large hiatal hernia  . Colonoscopy with esophagogastroduodenoscopy (egd)  06/05/2012    Dr. Gala Romney- EGD- "corkscrewed" tubular esophagus.  hiatal hernia beign bx. TCS- colonic divertiulosis, tubular adenoma, hyperplastic polyp  . Givens capsule study  06/26/2012    Dr. Gala Romney- probable 2 superficial erosions in the mid small bowel, no evidence of tumor or blood anywhere in the small intestine.    Prior to Admission medications   Medication Sig Start Date End Date Taking? Authorizing Provider  acetaminophen (TYLENOL) 500 MG tablet Take 500 mg by mouth every 6 (six) hours as needed. pain   Yes Historical Provider, MD  Alpha-D-Galactosidase (BEANO PO) Take by mouth daily.   Yes Historical Provider, MD  amoxicillin (AMOXIL) 875 MG tablet Take 875 mg by mouth 2 (two) times daily.   Yes Historical Provider, MD  beclomethasone (QVAR) 80 MCG/ACT inhaler Inhale 2 puffs into the lungs 2 (two) times daily. Shortness of breath   Yes Historical Provider, MD  CARAFATE 1 GM/10ML suspension Take 1 g by mouth as needed.  04/27/13  Yes Historical Provider, MD  ciprofloxacin (CIPRO) 500 MG tablet Take 500 mg by mouth 2 (two) times daily.   Yes Historical Provider, MD  ciprofloxacin-dexamethasone (CIPRODEX) otic suspension Place 1 drop into the left ear daily  as needed.    Yes Historical Provider, MD  Cranberry 500 MG CAPS Take 500 mg by mouth daily.   Yes Historical Provider, MD  cyanocobalamin (,VITAMIN B-12,) 1000 MCG/ML injection Inject 1,000 mcg into the muscle every 14 (fourteen) days.   Yes Historical Provider, MD  diphenoxylate-atropine (LOMOTIL) 2.5-0.025 MG per tablet Take 1 tablet by mouth 4 (four) times daily as needed. diarrhea   Yes Historical Provider, MD  EPIPEN 2-PAK 0.3 MG/0.3ML DEVI Inject 0.3 mg as directed as needed. Allergic Reaction 02/26/12  Yes Historical Provider, MD  fluticasone (FLONASE) 50 MCG/ACT nasal spray Place 2 sprays into the nose daily as needed. Congestion   Yes Historical Provider, MD  HYDROcodone-acetaminophen (NORCO/VICODIN) 5-325 MG per tablet as needed. 11/07/12  Yes Historical Provider, MD  lansoprazole (PREVACID) 30 MG capsule Take 30 mg by mouth 2 (two) times daily.    Yes Historical Provider, MD  levothyroxine (SYNTHROID, LEVOTHROID) 100 MCG tablet Take 100 mcg by mouth daily before breakfast.  09/24/12  Yes Historical Provider, MD  mirabegron ER (MYRBETRIQ) 25 MG TB24 tablet Take 25 mg by mouth daily.   Yes Historical Provider, MD  ondansetron (ZOFRAN) 4 MG tablet Take 1 tablet (4 mg total) by mouth once. Prior to procedure. 06/25/12  Yes Orvil Feil, NP  ondansetron (ZOFRAN) 4 MG tablet Take 1 tablet (4 mg total) by mouth every 6 (six) hours as needed for nausea or vomiting. 05/05/13  Yes Daneil Dolin, MD  prochlorperazine (COMPAZINE) 5 MG tablet Take 5 mg by mouth every 6 (six) hours as needed. Nausea,vomiting   Yes Historical Provider, MD  Simethicone (GAS-X PO) Take 180 mg by mouth 4 (four) times daily as needed.    Yes Historical Provider, MD  dicyclomine (BENTYL) 10 MG capsule Take 1 capsule (10 mg total) by mouth 4 (four) times daily -  before meals and at bedtime. 07/29/12 07/29/13  Daneil Dolin, MD  DULERA 200-5 MCG/ACT AERO Inhale 2 puffs into the lungs 2 (two) times daily.  04/09/12   Historical  Provider, MD  RABEprazole (ACIPHEX) 20 MG tablet Take 1 tablet (20 mg total) by mouth daily. 04/28/13   Daneil Dolin, MD    Allergies as of 04/21/2014 - Review Complete 04/21/2014  Allergen Reaction Noted  . Codeine Nausea And Vomiting   . Coumadin [warfarin]  06/19/2012  . Dexlansoprazole  04/05/2010  . Erythromycin ethylsuccinate  04/05/2010  . Esomeprazole magnesium Nausea And Vomiting   . Moxifloxacin Nausea And Vomiting 04/05/2010  . Omeprazole  04/05/2010  . Sulfonamide derivatives Nausea And Vomiting     Family History  Problem Relation Age of Onset  . Heart attack Father 51    deceased  . Colon cancer Neg Hx   . Anesthesia problems Neg Hx   . Hypotension Neg Hx   . Malignant hyperthermia Neg Hx   . Pseudochol deficiency Neg Hx     History   Social History  . Marital Status: Widowed    Spouse Name: N/A    Number of  Children: 2  . Years of Education: N/A   Occupational History  . taking computer classes    Social History Main Topics  . Smoking status: Never Smoker   . Smokeless tobacco: Never Used  . Alcohol Use: No  . Drug Use: No  . Sexual Activity: No   Other Topics Concern  . Not on file   Social History Narrative    Review of Systems: See HPI, otherwise negative ROS  Physical Exam: BP 160/75 mmHg  Pulse 64  Temp(Src) 97.8 F (36.6 C) (Oral)  Ht 5\' 5"  (1.651 m)  Wt 165 lb (74.844 kg)  BMI 27.46 kg/m2 General:   Alert, elderly, pleasant and cooperative in NAD Skin:  Intact without significant lesions or rashes. Eyes:  Sclera clear, no icterus.   Conjunctiva pink. Ears:  Normal auditory acuity. Nose:  No deformity, discharge,  or lesions. Mouth:  No deformity or lesions. Neck:  Supple; no masses or thyromegaly. No significant cervical adenopathy. Lungs:  Clear throughout to auscultation.   No wheezes, crackles, or rhonchi. No acute distress. Heart:  Regular rate and rhythm; no murmurs, clicks, rubs,  or gallops. Abdomen: Non-distended,  normal bowel sounds.  No succussion splash.Soft and nontender without appreciable mass or hepatosplenomegaly.  Pulses:  Normal pulses noted. Extremities:  Without clubbing or edema.  Impression: Pleasant 80 year old lady with long-standing GERD/gastroparesis/large hiatal hernia. Not an operative candidate. Reflux symptoms breaking through Prevacid once daily  Taking  calcium and Carafate.  Reiterated gastroparesis/antireflux diet. She may benefit from a trial of Dexilant in lieu of Prevacid.  Given her age, I do not recommend a future colonoscopy unless she develops symptoms.   Recommendations:Stop Prevacid; try Dexilant 60 mg daily  Continue Carafate and Tums  Continue Gastroparesis /anti-GERD diet  Office visit in 6 months    Notice: This dictation was prepared with Dragon dictation along with smaller phrase technology. Any transcriptional errors that result from this process are unintentional and may not be corrected upon review.

## 2014-04-21 NOTE — Patient Instructions (Signed)
Stop Prevacid; try Dexilant 60 mg daily  Continue Carafate and Tums  Continue Gastroparesis /anti-GERD diet  Office visit in 6 months

## 2014-06-08 DIAGNOSIS — N3941 Urge incontinence: Secondary | ICD-10-CM | POA: Diagnosis not present

## 2014-06-08 DIAGNOSIS — R3915 Urgency of urination: Secondary | ICD-10-CM | POA: Diagnosis not present

## 2014-06-11 DIAGNOSIS — E539 Vitamin B deficiency, unspecified: Secondary | ICD-10-CM | POA: Diagnosis not present

## 2014-06-18 DIAGNOSIS — R300 Dysuria: Secondary | ICD-10-CM | POA: Diagnosis not present

## 2014-07-01 DIAGNOSIS — R300 Dysuria: Secondary | ICD-10-CM | POA: Diagnosis not present

## 2014-07-01 DIAGNOSIS — N3000 Acute cystitis without hematuria: Secondary | ICD-10-CM | POA: Diagnosis not present

## 2014-07-09 DIAGNOSIS — E039 Hypothyroidism, unspecified: Secondary | ICD-10-CM | POA: Diagnosis not present

## 2014-07-09 DIAGNOSIS — D649 Anemia, unspecified: Secondary | ICD-10-CM | POA: Diagnosis not present

## 2014-07-09 DIAGNOSIS — E539 Vitamin B deficiency, unspecified: Secondary | ICD-10-CM | POA: Diagnosis not present

## 2014-07-09 DIAGNOSIS — E782 Mixed hyperlipidemia: Secondary | ICD-10-CM | POA: Diagnosis not present

## 2014-07-09 DIAGNOSIS — R5383 Other fatigue: Secondary | ICD-10-CM | POA: Diagnosis not present

## 2014-07-11 DIAGNOSIS — I10 Essential (primary) hypertension: Secondary | ICD-10-CM | POA: Diagnosis not present

## 2014-07-11 DIAGNOSIS — R0602 Shortness of breath: Secondary | ICD-10-CM | POA: Diagnosis not present

## 2014-07-11 DIAGNOSIS — R079 Chest pain, unspecified: Secondary | ICD-10-CM | POA: Diagnosis not present

## 2014-07-11 DIAGNOSIS — R1013 Epigastric pain: Secondary | ICD-10-CM | POA: Diagnosis not present

## 2014-07-11 DIAGNOSIS — R55 Syncope and collapse: Secondary | ICD-10-CM | POA: Diagnosis not present

## 2014-07-11 DIAGNOSIS — Z79899 Other long term (current) drug therapy: Secondary | ICD-10-CM | POA: Diagnosis not present

## 2014-07-11 DIAGNOSIS — E039 Hypothyroidism, unspecified: Secondary | ICD-10-CM | POA: Diagnosis not present

## 2014-07-11 DIAGNOSIS — R51 Headache: Secondary | ICD-10-CM | POA: Diagnosis not present

## 2014-07-11 DIAGNOSIS — K219 Gastro-esophageal reflux disease without esophagitis: Secondary | ICD-10-CM | POA: Diagnosis not present

## 2014-07-15 DIAGNOSIS — R55 Syncope and collapse: Secondary | ICD-10-CM | POA: Diagnosis not present

## 2014-07-15 DIAGNOSIS — M545 Low back pain: Secondary | ICD-10-CM | POA: Diagnosis not present

## 2014-07-23 DIAGNOSIS — R300 Dysuria: Secondary | ICD-10-CM | POA: Diagnosis not present

## 2014-07-23 DIAGNOSIS — N3091 Cystitis, unspecified with hematuria: Secondary | ICD-10-CM | POA: Diagnosis not present

## 2014-07-23 DIAGNOSIS — D510 Vitamin B12 deficiency anemia due to intrinsic factor deficiency: Secondary | ICD-10-CM | POA: Diagnosis not present

## 2014-07-27 DIAGNOSIS — M9903 Segmental and somatic dysfunction of lumbar region: Secondary | ICD-10-CM | POA: Diagnosis not present

## 2014-07-27 DIAGNOSIS — M4004 Postural kyphosis, thoracic region: Secondary | ICD-10-CM | POA: Diagnosis not present

## 2014-07-27 DIAGNOSIS — M47816 Spondylosis without myelopathy or radiculopathy, lumbar region: Secondary | ICD-10-CM | POA: Diagnosis not present

## 2014-07-27 DIAGNOSIS — M9901 Segmental and somatic dysfunction of cervical region: Secondary | ICD-10-CM | POA: Diagnosis not present

## 2014-07-27 DIAGNOSIS — M9902 Segmental and somatic dysfunction of thoracic region: Secondary | ICD-10-CM | POA: Diagnosis not present

## 2014-07-27 DIAGNOSIS — M47812 Spondylosis without myelopathy or radiculopathy, cervical region: Secondary | ICD-10-CM | POA: Diagnosis not present

## 2014-07-27 DIAGNOSIS — M47814 Spondylosis without myelopathy or radiculopathy, thoracic region: Secondary | ICD-10-CM | POA: Diagnosis not present

## 2014-07-28 ENCOUNTER — Ambulatory Visit: Payer: Medicare HMO | Admitting: Nurse Practitioner

## 2014-08-04 DIAGNOSIS — I4891 Unspecified atrial fibrillation: Secondary | ICD-10-CM | POA: Diagnosis not present

## 2014-08-04 DIAGNOSIS — J45909 Unspecified asthma, uncomplicated: Secondary | ICD-10-CM | POA: Diagnosis not present

## 2014-08-04 DIAGNOSIS — K589 Irritable bowel syndrome without diarrhea: Secondary | ICD-10-CM | POA: Diagnosis not present

## 2014-08-04 DIAGNOSIS — R05 Cough: Secondary | ICD-10-CM | POA: Diagnosis not present

## 2014-08-04 DIAGNOSIS — K222 Esophageal obstruction: Secondary | ICD-10-CM | POA: Diagnosis not present

## 2014-08-04 DIAGNOSIS — R0602 Shortness of breath: Secondary | ICD-10-CM | POA: Diagnosis not present

## 2014-08-04 DIAGNOSIS — N390 Urinary tract infection, site not specified: Secondary | ICD-10-CM | POA: Diagnosis not present

## 2014-08-04 DIAGNOSIS — R42 Dizziness and giddiness: Secondary | ICD-10-CM | POA: Diagnosis not present

## 2014-08-04 DIAGNOSIS — M797 Fibromyalgia: Secondary | ICD-10-CM | POA: Diagnosis not present

## 2014-08-04 DIAGNOSIS — E039 Hypothyroidism, unspecified: Secondary | ICD-10-CM | POA: Diagnosis not present

## 2014-08-04 DIAGNOSIS — E860 Dehydration: Secondary | ICD-10-CM | POA: Diagnosis not present

## 2014-08-04 DIAGNOSIS — K219 Gastro-esophageal reflux disease without esophagitis: Secondary | ICD-10-CM | POA: Diagnosis not present

## 2014-08-05 DIAGNOSIS — N3091 Cystitis, unspecified with hematuria: Secondary | ICD-10-CM | POA: Diagnosis not present

## 2014-08-05 DIAGNOSIS — R05 Cough: Secondary | ICD-10-CM | POA: Diagnosis not present

## 2014-08-05 DIAGNOSIS — D510 Vitamin B12 deficiency anemia due to intrinsic factor deficiency: Secondary | ICD-10-CM | POA: Diagnosis not present

## 2014-08-05 DIAGNOSIS — J069 Acute upper respiratory infection, unspecified: Secondary | ICD-10-CM | POA: Diagnosis not present

## 2014-08-11 ENCOUNTER — Ambulatory Visit (INDEPENDENT_AMBULATORY_CARE_PROVIDER_SITE_OTHER): Payer: Medicare HMO | Admitting: Nurse Practitioner

## 2014-08-11 VITALS — BP 129/69 | HR 79 | Temp 98.0°F | Ht 68.0 in | Wt 158.6 lb

## 2014-08-11 DIAGNOSIS — D649 Anemia, unspecified: Secondary | ICD-10-CM | POA: Diagnosis not present

## 2014-08-11 DIAGNOSIS — K219 Gastro-esophageal reflux disease without esophagitis: Secondary | ICD-10-CM | POA: Diagnosis not present

## 2014-08-11 MED ORDER — DEXLANSOPRAZOLE 60 MG PO CPDR: 60.0000 mg | DELAYED_RELEASE_CAPSULE | Freq: Every day | ORAL | Status: DC

## 2014-08-11 NOTE — Progress Notes (Signed)
Referring Provider: Caryl Bis, MD Primary Care Physician:  Gar Ponto, MD Primary GI:  Dr. Gala Romney  Chief Complaint  Patient presents with  . Gastrophageal Reflux  . Nausea    HPI:   81 year old female presents for acid reflux symptoms and, per chief complaint, is due for colonoscopy. Was worked up previously for anemia with EGD and colonoscopy done 06/05/12 which showed redundant "corkscrew" esophagus, hiatal hernia, s/p small bowel biopsy; minimal internal hemorrhoids, pancolonic diverticula,, 4 mm mid-descending colon polyp (tubular adenoma on path report), three 5 mm polyps in the rectosigmoid segment (hyperplastic on path report). Duodenal biopsy on path report benign mucosa without inflammation; recommended repeat colonoscopy in 5 years (2019). Capsule study done 06/26/12 showed a couple superficial erosion in the mid-small intestine, no evidence of tumor or blood in the small intestine. At some point it was noted she had been having substantial epistaxis and was determined this was the likely cause of her anemia.  Today she states she had to change from [place] back to Prevacid because her insurance wouldn't cover the [place] or name brand prevacid and will only cover generic prevacid which she states does not work as well. They also stopped covering the GI cocktail which worked well for her and will only cover Carafate which does not work as well. Is now having worsening GERD symptoms including esophageal and throat burning, bitter sour taste, and nausea. Symptoms began about the time her medications were changed by insurance. Is using TUMS as add on therapy for better control, though control remains marginal at best. Denies vomiting. Denies other abdominal pain, diarrhea. Has a bowel movement movement daily without straining and it is normal/formed and soft. Denies hematochezia and melena. Has had no further problems with epistaxis. Denies dysphagia. Denies any other upper or lower  GI symptoms.  Past Medical History  Diagnosis Date  . Allergic rhinitis   . Asthma   . GERD (gastroesophageal reflux disease)   . Hypothyroidism   . Gastroparesis 08/2008    mild, 35% retention at 2 hours (normal is less than 30% at 2 hours)  . Schatzki's ring     h/o 8 EGDs  . Irritable bowel syndrome   . Shortness of breath   . PONV (postoperative nausea and vomiting)   . Cancer     skin cancer form left arm  . Hiatal hernia 09/01/2004    large  . Tubular adenoma   . Hyperplastic colon polyp   . IDA (iron deficiency anemia)     Past Surgical History  Procedure Laterality Date  . S/p hysterectomy    . Segmental colectomy      with incidental appendectomy in [place]  . Esophagogastroduodenoscopy  8/08    RMR: cork-screwed esophagus,prominent Schatki's ring large hh  . Colonoscopy  09/01/2004    RMR: normal, due 08/2014  . Esophagogastroduodenoscopy  04/18/10    prominent schatzki's ring/large hiatal hernia  otherwise normal stomach  . Abdominal hysterectomy    . Colon surgery    . Dilation and curettage of uterus      x2  . Arm skin lesion biopsy / excision      left arm  . Cataract extraction w/phaco  08/27/2011    Procedure: CATARACT EXTRACTION PHACO AND INTRAOCULAR LENS PLACEMENT (IOC);  Surgeon: Tonny Branch, MD;  Location: [facility];  Service: Ophthalmology;  Laterality: Right;  CDE: 15.12  . Esophagogastroduodenoscopy   09/25/2002    RMR: Prominent Schatski's ring, foreshortened esophagus,  moderate size hiatal hernia.  The remainder of the stomach and duodenum through the second portion appeared normal.  Status post balloon dilation of ring,  . Esophagogastroduodenoscopy  01/28/2004    [id number] ring status post dilation as described above. Otherwise normal/ Large hiatal hernia  . Esophagogastroduodenoscopy  09/01/2004    RMR: Schatzki's ring, otherwise normal esophagus status post dilation/Moderately large hiatal hernia  . Esophagogastroduodenoscopy   12/14/2005      RMR: Schatzki's ring, otherwise normal esophagus status post dilation /Large hiatal hernia  . Colonoscopy with esophagogastroduodenoscopy (egd)  06/05/2012    Dr. Gala Romney- EGD- "corkscrewed" tubular esophagus.  hiatal hernia beign bx. TCS- colonic divertiulosis, tubular adenoma, hyperplastic polyp  . Givens capsule study  06/26/2012    Dr. Gala Romney- probable 2 superficial erosions in the mid small bowel, no evidence of tumor or blood anywhere in the small intestine.    Current Outpatient Prescriptions  Medication Sig Dispense Refill  . acetaminophen (TYLENOL) 500 MG tablet Take 500 mg by mouth every 6 (six) hours as needed. pain    . Alpha-D-Galactosidase (BEANO PO) Take by mouth daily.    . beclomethasone (QVAR) 80 MCG/ACT inhaler Inhale 2 puffs into the lungs 2 (two) times daily. Shortness of breath    . CARAFATE 1 GM/10ML suspension Take 1 g by mouth as needed.     . Cranberry 500 MG CAPS Take 500 mg by mouth daily.    . cyanocobalamin (,VITAMIN B-12,) 1000 MCG/ML injection Inject 1,000 mcg into the muscle every 14 (fourteen) days.    . diphenoxylate-atropine (LOMOTIL) 2.5-0.025 MG per tablet Take 1 tablet by mouth 4 (four) times daily as needed. diarrhea    . DULERA 200-5 MCG/ACT AERO Inhale 2 puffs into the lungs 2 (two) times daily.     Marland Kitchen EPIPEN 2-PAK 0.3 MG/0.3ML DEVI Inject 0.3 mg as directed as needed. Allergic Reaction    . lansoprazole (PREVACID) 30 MG capsule Take 30 mg by mouth 2 (two) times daily.     Marland Kitchen levothyroxine (SYNTHROID, LEVOTHROID) 100 MCG tablet Take 100 mcg by mouth daily before breakfast.     . ondansetron (ZOFRAN) 4 MG tablet Take 1 tablet (4 mg total) by mouth every 6 (six) hours as needed for nausea or vomiting. 20 tablet 0  . prochlorperazine (COMPAZINE) 5 MG tablet Take 5 mg by mouth every 6 (six) hours as needed. Nausea,vomiting    . Simethicone (GAS-X PO) Take 180 mg by mouth 4 (four) times daily as needed.     . traMADol (ULTRAM) 50 MG tablet     . amoxicillin  (AMOXIL) 875 MG tablet Take 875 mg by mouth 2 (two) times daily.    . ciprofloxacin (CIPRO) 500 MG tablet Take 500 mg by mouth 2 (two) times daily.    . ciprofloxacin-dexamethasone (CIPRODEX) otic suspension Place 1 drop into the left ear daily as needed.     . dicyclomine (BENTYL) 10 MG capsule Take 1 capsule (10 mg total) by mouth 4 (four) times daily -  before meals and at bedtime. 30 capsule 0  . fluticasone (FLONASE) 50 MCG/ACT nasal spray Place 2 sprays into the nose daily as needed. Congestion    . HYDROcodone-acetaminophen (NORCO/VICODIN) 5-325 MG per tablet as needed.    . mirabegron ER (MYRBETRIQ) 25 MG TB24 tablet Take 25 mg by mouth daily.    . ondansetron (ZOFRAN) 4 MG tablet Take 1 tablet (4 mg total) by mouth once. Prior to procedure. (Patient not taking: Reported on 08/11/2014) 1  tablet 0  . RABEprazole (ACIPHEX) 20 MG tablet Take 1 tablet (20 mg total) by mouth daily. (Patient not taking: Reported on 08/11/2014) 30 tablet 11  . [DISCONTINUED] calcium carbonate (OS-CAL) 600 MG TABS Take 600 mg by mouth 2 (two) times daily with a meal.       No current facility-administered medications for this visit.    Allergies as of 08/11/2014 - Review Complete 08/11/2014  Allergen Reaction Noted  . Codeine Nausea And Vomiting   . Coumadin [warfarin]  06/19/2012  . Dexlansoprazole  04/05/2010  . Erythromycin ethylsuccinate  04/05/2010  . Esomeprazole magnesium Nausea And Vomiting   . Moxifloxacin Nausea And Vomiting 04/05/2010  . Omeprazole  04/05/2010  . Sulfonamide derivatives Nausea And Vomiting     Family History  Problem Relation Age of Onset  . Heart attack Father 35    deceased  . Colon cancer Neg Hx   . Anesthesia problems Neg Hx   . Hypotension Neg Hx   . Malignant hyperthermia Neg Hx   . Pseudochol deficiency Neg Hx     History   Social History  . Marital Status: Widowed    Spouse Name: N/A  . Number of Children: 2  . Years of Education: N/A   Occupational History   . taking computer classes    Social History Main Topics  . Smoking status: Never Smoker   . Smokeless tobacco: Never Used  . Alcohol Use: No  . Drug Use: No  . Sexual Activity: No   Other Topics Concern  . Not on file   Social History Narrative    Review of Systems: Gen: Denies fever, chills, anorexia. Denies fatigue, weakness, weight loss.  CV: Denies chest pain, palpitations, syncope. Resp: Denies dyspnea at rest, wheezing, coughing up blood. GI: See HPI. Denies vomiting blood, jaundice, and fecal incontinence.   Denies dysphagia or odynophagia. Derm: Denies rash, itching, dry skin Psych: Denies depression, anxiety, memory loss, confusion. No homicidal or suicidal ideation.  Heme: Denies bruising, bleeding, and enlarged lymph nodes.  Physical Exam: BP 129/69 mmHg  Pulse 79  Temp(Src) 98 F (36.7 C) (Oral)  Ht 5\' 8"  (1.727 m)  Wt 158 lb 9.6 oz (71.94 kg)  BMI 24.12 kg/m2 General:   Alert and oriented. No distress noted. Pleasant and cooperative.  Head:  Normocephalic and atraumatic. Neck:  Supple, without mass or thyromegaly. Lungs:  Clear to auscultation bilaterally. No wheezes, rales, or rhonchi. No distress.  Heart:  S1, S2 present without murmurs, rubs, or gallops. Regular rate and rhythm. Abdomen:  +BS, soft, and non-distended. Mild to moderate epigastric TTP. No rebound or guarding. No HSM or masses noted. Extremities:  Without edema. Neurologic:  Alert and  oriented x4;  grossly normal neurologically. Skin:  Intact without significant lesions or rashes. Psych:  Alert and cooperative. Normal mood and affect.    08/11/2014 2:40 PM

## 2014-08-11 NOTE — Assessment & Plan Note (Signed)
Worsening GERD symptoms, medications change from Dexilant and GI cocktail to generic prevacid and carafate, neither of which are effective. Esophageal burning, nausea, sour/bitter taste and epigastric TTP noted on ROS and exam. Will try to place patient back on Dexilant today, Rx sent in and samples for 2 weeks and if denied by insurance will appeal based on has tried and failed other PPI medications. Return for follow-up in 4 weeks.

## 2014-08-11 NOTE — Assessment & Plan Note (Signed)
Per patient anemia is much improved, no longer having epistaxis. No overt GI bleeding noted. No CBC on file in 1-2 years, will check CBC for anemia status. Full GI workup including EGD, colonoscopy, and capsule study with no obvious source of GI bleed. Due for repeat screening colonoscopy in 2019 for tubular adenomatous polyp x 1.

## 2014-08-11 NOTE — Patient Instructions (Addendum)
1. Stop taking the lansoprazole (generic prevacid) for now 2. Start taking Dexilant 60 mg daily, 30 minutes before your first meal of the day 3. We will send in a prescription for Dexilant. If you get a denial letter let us know so we can appeal. 4. Return for follow-up in 4 weeks. 5. Have your CBC drawn in the next few days so we can check your blood levels.

## 2014-08-12 NOTE — Progress Notes (Signed)
cc'ed to pcp °

## 2014-08-17 DIAGNOSIS — M47814 Spondylosis without myelopathy or radiculopathy, thoracic region: Secondary | ICD-10-CM | POA: Diagnosis not present

## 2014-08-17 DIAGNOSIS — M47812 Spondylosis without myelopathy or radiculopathy, cervical region: Secondary | ICD-10-CM | POA: Diagnosis not present

## 2014-08-17 DIAGNOSIS — M9903 Segmental and somatic dysfunction of lumbar region: Secondary | ICD-10-CM | POA: Diagnosis not present

## 2014-08-17 DIAGNOSIS — M9901 Segmental and somatic dysfunction of cervical region: Secondary | ICD-10-CM | POA: Diagnosis not present

## 2014-08-17 DIAGNOSIS — M47816 Spondylosis without myelopathy or radiculopathy, lumbar region: Secondary | ICD-10-CM | POA: Diagnosis not present

## 2014-08-17 DIAGNOSIS — M9902 Segmental and somatic dysfunction of thoracic region: Secondary | ICD-10-CM | POA: Diagnosis not present

## 2014-08-17 DIAGNOSIS — M4004 Postural kyphosis, thoracic region: Secondary | ICD-10-CM | POA: Diagnosis not present

## 2014-08-20 DIAGNOSIS — E539 Vitamin B deficiency, unspecified: Secondary | ICD-10-CM | POA: Diagnosis not present

## 2014-08-24 DIAGNOSIS — D649 Anemia, unspecified: Secondary | ICD-10-CM | POA: Diagnosis not present

## 2014-08-25 ENCOUNTER — Other Ambulatory Visit: Payer: Self-pay

## 2014-08-25 ENCOUNTER — Ambulatory Visit: Payer: Self-pay

## 2014-08-25 LAB — CBC WITH DIFFERENTIAL/PLATELET
Basophils Absolute: 0 10*3/uL (ref 0.0–0.1)
Basophils Relative: 0 % (ref 0–1)
EOS PCT: 3 % (ref 0–5)
Eosinophils Absolute: 0.2 10*3/uL (ref 0.0–0.7)
HEMATOCRIT: 34.3 % — AB (ref 36.0–46.0)
HEMOGLOBIN: 11.2 g/dL — AB (ref 12.0–15.0)
LYMPHS PCT: 32 % (ref 12–46)
Lymphs Abs: 1.9 10*3/uL (ref 0.7–4.0)
MCH: 31.3 pg (ref 26.0–34.0)
MCHC: 32.7 g/dL (ref 30.0–36.0)
MCV: 95.8 fL (ref 78.0–100.0)
MPV: 10.3 fL (ref 8.6–12.4)
Monocytes Absolute: 0.6 10*3/uL (ref 0.1–1.0)
Monocytes Relative: 10 % (ref 3–12)
NEUTROS ABS: 3.3 10*3/uL (ref 1.7–7.7)
Neutrophils Relative %: 55 % (ref 43–77)
Platelets: 313 10*3/uL (ref 150–400)
RBC: 3.58 MIL/uL — AB (ref 3.87–5.11)
RDW: 17.3 % — ABNORMAL HIGH (ref 11.5–15.5)
WBC: 6 10*3/uL (ref 4.0–10.5)

## 2014-08-25 NOTE — Patient Outreach (Signed)
   08/25/2014   Sabrina Morrison [birth year] [id number]   [facility] Screening Call Note:   [facility] delegation referral. Patient verified the following providers:  Primary MD;  Dr. Mitzie Na. Quillian Quince - next Appt  5/16 Gastroenterologist:  Daneil Dolin, MD Allergist/ASTHMA Specialist:  Dr. Parks Ranger - next Appt 08/31/14 Patient states she is active with [facility].    ASTHMA:  Patient states issues with asthma requiring ED visit 08/04/2014 to [place] in the home but only uses as needed.  NO home oxygen prescribed.  States NOT using inhalers due to past side effects of near fainting episodes she relates to the inhalers.  States symptoms resolved after she stopped the use of the inhalers.  Patient completed follow-up with primary MD office PA.   Next primary care follow-up appointment scheduled for May 2016. Denies any Inpt. Admissions over the past year.   UROLOGY/BLADDER:  Patient states new issue and believes she has a bladder infection; patient has scheduled appointment for urinalysis 08/26/2014.    FIBROMYALGIA:  Patient states she uses alternative services and sees a chiropractor every other week (co-pay $20.00) for mgmt of fibromyalgia.  States she is interested in massage therapy as well.  Patient is not sure of her health insurance benefit for this service.    SOCIAL / SAFETY:  Patient is widowed and lives alone and has no support system other than calling on friends at times of need.  Patient is estranged from her children.  Denies any falls over the past year.  Patient admits to episode of safety concerns over the past year while dealing with illness and was too sick to call 911.   Patient currently does not have safety alert system in place. Patient verbalized that her church does little to assist widows.   Patient agrees to [place] Management Coordinator Services and to next appointment call for Initial Assessment.  -RN CM provided patient with self-management tips  to manage asthma symptoms during high pollen season.  -RN CM identifies patient in need of care coordination services as follows: -Disease Management - ASTHMA -Medication Education to improve compliance of disease specific medication management  -Knowledge of health insurance benefit services -Social Work referral for home safety alert system eligibility and resource options. -Social Work referral for community resources for support of widows.  -RN CM will schedule Initial Assessment within one month.  -RN CM instructed patient to notify MD of any changes in her condition and call 911 for any urgent needs.  Encouraged communication with MD over ED when appropriate.   -RN CM left name and contact # and advised patient will receive introductory package in the mail with written consent form enclosed to review, sign and return to [facility].   -RN CM will follow for Level II Disease Management Services.

## 2014-08-26 DIAGNOSIS — N3000 Acute cystitis without hematuria: Secondary | ICD-10-CM | POA: Diagnosis not present

## 2014-08-30 ENCOUNTER — Other Ambulatory Visit (HOSPITAL_COMMUNITY): Payer: Self-pay | Admitting: Family Medicine

## 2014-08-30 DIAGNOSIS — Z1231 Encounter for screening mammogram for malignant neoplasm of breast: Secondary | ICD-10-CM

## 2014-08-31 ENCOUNTER — Other Ambulatory Visit: Payer: Self-pay

## 2014-08-31 DIAGNOSIS — M9902 Segmental and somatic dysfunction of thoracic region: Secondary | ICD-10-CM | POA: Diagnosis not present

## 2014-08-31 DIAGNOSIS — M4004 Postural kyphosis, thoracic region: Secondary | ICD-10-CM | POA: Diagnosis not present

## 2014-08-31 DIAGNOSIS — M47814 Spondylosis without myelopathy or radiculopathy, thoracic region: Secondary | ICD-10-CM | POA: Diagnosis not present

## 2014-08-31 DIAGNOSIS — M9903 Segmental and somatic dysfunction of lumbar region: Secondary | ICD-10-CM | POA: Diagnosis not present

## 2014-08-31 DIAGNOSIS — M47812 Spondylosis without myelopathy or radiculopathy, cervical region: Secondary | ICD-10-CM | POA: Diagnosis not present

## 2014-08-31 DIAGNOSIS — M47816 Spondylosis without myelopathy or radiculopathy, lumbar region: Secondary | ICD-10-CM | POA: Diagnosis not present

## 2014-08-31 DIAGNOSIS — M9901 Segmental and somatic dysfunction of cervical region: Secondary | ICD-10-CM | POA: Diagnosis not present

## 2014-08-31 NOTE — Patient Outreach (Signed)
[facility]) Care Management  08/31/2014  ZYAH GOMM 13-May-1931 [id number]   [place] Management Coordinator Outreach call to patient for Initial Assessment.  Referral Source:  [facility] delegation referral. Patient verified the following providers:  Primary MD:   Dr. Mitzie Na. Daniel - next Appt: Oct 06, 2014 (per patient) (Appt Verification:   Last appt 08/26/14, next appt 10/02/14 for B12 injection and 10/18/14 for general follow-up) Gastroenterologist: Daneil Dolin, MD - next appt September 13, 2014 Allergist/ASTHMA Specialist: Dr. Parks Ranger - next Appt 08/31/14 Urologist:  Dr. Jeanelle Malling - next appt:  September 06, 2014 Mammogram:  End of May 2016  ASTHMA: H/o asthma requiring ED visit 08/04/2014 to [place] in the home but only uses as needed.  Patient has used none over the past week but states uses on average 1-3 times per week.  NO home oxygen prescribed. States NOT using inhalers due to past side effects of near fainting episodes she relates to the inhalers, (QVAR and another inhaler which she is not able to recall the name of.)  Patient states she uses no inhalers other than her nebulizer.  Next primary care follow-up appointment scheduled for May 2016. Denies any Inpt. Admissions over the past year but has required ED visits x 2.  Patient states she missed MD appt today 08/31/14 due to "too sick to drive over due to my bronchitis".   RN CM encouraged patient not to miss appt's and that patient should be seen to avoid unnecessary ED visits for possible worsening condition.  Advised that follow-up is important for MD to assess  If patient is responding to current treatment and modify as needed to manage her condition.  Patient confirmed she has rescheduled this appt for May 2016.  RN CM will continue to monitor for compliance with keeping all MD appt's.  RN CM contacted Primary MD, Dr. Olena Heckle and verified next appt's on schedule:  Last  appt 08/26/14, next appt 10/02/14 for B12 injection and 10/18/14 for general follow-up.     UROLOGY/BLADDER: Patient states new issue and believes she has a bladder infection; patient has scheduled appointment for follow-up with Urologist, Dr. Exie Parody on 09/06/2014  FIBROMYALGIA: Hx/o uses alternative services and sees a chiropractor every other week (co-pay $20.00) for mgmt of fibromyalgia. States she is interested in massage therapy as well. Patient is not sure of her health insurance benefit for this service.   SOCIAL / SAFETY: Patient is widowed and lives alone and has no support system other than calling on friends at times of need. Patient is estranged from her children. Denies any falls over the past year. Hx/o reported episode of safety concerns over the past year while dealing with illness and was too sick to call 911. Patient currently does not have safety alert system in place. Patient verbalized that her church does little to assist widows or provide transportation assistance.  H/o active with [facility]: [address] [place], [name], [address] for management of anxiety and depression.  Patient denied any depression this call.  DME:  Nebulizer, scales for weighing, reading glasses Emergency Contact:  None - patient states she has no one at this time.   Plan:   Patient agrees to [place] Management Coordinator Services and to next appointment call for monthly Disease Management and Care Coordination call. RN CM identifies barriers to care:  Transportation, Keeping MD appt's.  RN CM will continue to monitor and provide patient with  supportive interventions.    -Social Work referral for home safety alert system eligibility and resource options sent on 08/25/2014. -Social Work referral for [employer] for support of widows sent  on 08/25/2014.  -New Social Work referral for Transportation needs to medical appt's. Sent on 08/31/2014 -RN CM will schedule next  follow-up call within one month and provided name and contact # for questions or concerns. -RN CM instructed patient to notify MD of any changes in her condition and call 911 for any urgent needs. Encouraged communication with MD over ED when appropriate.  -RN CM will follow-up again next call on receipt of introductory package in the mail with written consent form enclosed to review, sign and return to [facility].  -RN CM will follow for Level 3 Disease Management Services and Care Coordination Services.[name], RN, BSN, [facility], [place] Management Care Management Coordinator [phone] Office [phone] Direct [phone] Cell

## 2014-09-01 DIAGNOSIS — I10 Essential (primary) hypertension: Secondary | ICD-10-CM | POA: Diagnosis not present

## 2014-09-01 DIAGNOSIS — M7532 Calcific tendinitis of left shoulder: Secondary | ICD-10-CM | POA: Diagnosis not present

## 2014-09-01 DIAGNOSIS — E039 Hypothyroidism, unspecified: Secondary | ICD-10-CM | POA: Diagnosis not present

## 2014-09-03 DIAGNOSIS — E539 Vitamin B deficiency, unspecified: Secondary | ICD-10-CM | POA: Diagnosis not present

## 2014-09-06 ENCOUNTER — Ambulatory Visit: Payer: Self-pay | Admitting: Licensed Clinical Social Worker

## 2014-09-06 ENCOUNTER — Telehealth: Payer: Self-pay

## 2014-09-06 ENCOUNTER — Other Ambulatory Visit: Payer: Self-pay

## 2014-09-06 DIAGNOSIS — N905 Atrophy of vulva: Secondary | ICD-10-CM | POA: Diagnosis not present

## 2014-09-06 DIAGNOSIS — N8110 Cystocele, unspecified: Secondary | ICD-10-CM | POA: Diagnosis not present

## 2014-09-06 DIAGNOSIS — R3915 Urgency of urination: Secondary | ICD-10-CM | POA: Diagnosis not present

## 2014-09-06 NOTE — Patient Outreach (Signed)
[place] [facility]) Care Management  09/06/2014  Sabrina Morrison February 14, 1931 [id number]   Inbound call received from Theadore Nan, [place], [place] stating patient contacted him today and cancelled their pending appt.  Issues regarding SW needs and reason for referral discussed.  Nicki Reaper will attempt to reschedule another appt time with patient but is not sure why patient cancelled appt so suddenly.   RN CM will continue to follow patient and update team with any new updates.    [name], RN, BSN, [facility], CCM  [employer] Management Coordinator [phone] Office [phone] Direct [phone] Cell

## 2014-09-06 NOTE — Patient Outreach (Signed)
[place] [facility] [facility]) Care Management  09/06/2014  MIJA EFFERTZ 07-14-1930 [id number]  Outreach call to patient to follow-up on patient due to cancelling appt with LCSW today.  Patient not reached; RN CM left voice message with name and contact #.   [name], RN, BSN, [facility], CCM  [employer] Management Coordinator [phone] Office [phone] Direct [phone] Cell

## 2014-09-13 ENCOUNTER — Ambulatory Visit: Payer: Self-pay | Admitting: Nurse Practitioner

## 2014-09-16 DIAGNOSIS — E539 Vitamin B deficiency, unspecified: Secondary | ICD-10-CM | POA: Diagnosis not present

## 2014-09-17 ENCOUNTER — Other Ambulatory Visit: Payer: Self-pay | Admitting: Licensed Clinical Social Worker

## 2014-09-17 NOTE — Patient Outreach (Signed)
  Assessment: CSW had received referral on client recently.  Previously client and CSW had spoken via phone and set up appointment for CSW and client to meet in [place], [name] at a local church on 09/06/14 for CSW to discuss [facility] programs and support options for client through [facility] programs. However, on the morning of 09/06/14, client had called CSW and abruptly cancelled meeting between [place] and client for 09/06/14.  CSW had called Chrystal Hutchinson, RN, at [facility] on 09/06/14 to inform Chrystal of this action by client.  Chrystal also called client phone number on 09/06/14 to ask client about cancellation of appointment scheduled with CSW on 09/06/14. Chrystal was not able to speak via phone with client on 09/06/14 and has not received return call from client since 09/06/14.  CSW called client home phone on 09/17/14 to attempt to speak with client.  CSW was not able to speak with client via phone on 09/17/14; however, CSW left phone message for client on 09/17/14 and asked client to please return call to [place]  at [phone] to discuss current needs of client and further discuss [facility] program.  Plan: Client to return call to [place] at [phone] to discuss needs of client and further discuss [facility] program support. Client to take medications as prescribed and to attend scheduled medical appointments. If CSW does not receive return call from client within 2 business days, then CSW will again call client next week.   Norva Riffle.Rockne Dearinger MSW, LCSW Licensed Clinical Social Worker [facility] Care Management [phone]

## 2014-09-24 DIAGNOSIS — R300 Dysuria: Secondary | ICD-10-CM | POA: Diagnosis not present

## 2014-09-24 DIAGNOSIS — R69 Illness, unspecified: Secondary | ICD-10-CM | POA: Diagnosis not present

## 2014-09-28 ENCOUNTER — Other Ambulatory Visit: Payer: Self-pay

## 2014-09-28 DIAGNOSIS — M9903 Segmental and somatic dysfunction of lumbar region: Secondary | ICD-10-CM | POA: Diagnosis not present

## 2014-09-28 DIAGNOSIS — M47814 Spondylosis without myelopathy or radiculopathy, thoracic region: Secondary | ICD-10-CM | POA: Diagnosis not present

## 2014-09-28 DIAGNOSIS — M47816 Spondylosis without myelopathy or radiculopathy, lumbar region: Secondary | ICD-10-CM | POA: Diagnosis not present

## 2014-09-28 DIAGNOSIS — M9902 Segmental and somatic dysfunction of thoracic region: Secondary | ICD-10-CM | POA: Diagnosis not present

## 2014-09-28 DIAGNOSIS — M9901 Segmental and somatic dysfunction of cervical region: Secondary | ICD-10-CM | POA: Diagnosis not present

## 2014-09-28 DIAGNOSIS — M4004 Postural kyphosis, thoracic region: Secondary | ICD-10-CM | POA: Diagnosis not present

## 2014-09-28 DIAGNOSIS — M797 Fibromyalgia: Secondary | ICD-10-CM | POA: Diagnosis not present

## 2014-09-28 DIAGNOSIS — M47812 Spondylosis without myelopathy or radiculopathy, cervical region: Secondary | ICD-10-CM | POA: Diagnosis not present

## 2014-09-28 NOTE — Patient Outreach (Signed)
[facility]) Care Management  09/28/2014  Sabrina Morrison [birth year] [id number]   Outreach Call #1 for Monthly Disease Management and Care Coordination services.  Acuity level:  3 H/o SW Referral sent 08/31/2014  Patient not reached.  Voice message left with contact information.   Rescheduled for next outreach call within one week  [name], RN, BSN, [facility], [place] Management Care Management Coordinator [phone] Office [phone] Direct [phone] Cell

## 2014-09-29 ENCOUNTER — Ambulatory Visit: Payer: Self-pay | Admitting: Nurse Practitioner

## 2014-09-30 ENCOUNTER — Ambulatory Visit: Payer: Commercial Managed Care - HMO

## 2014-10-01 DIAGNOSIS — E539 Vitamin B deficiency, unspecified: Secondary | ICD-10-CM | POA: Diagnosis not present

## 2014-10-04 ENCOUNTER — Other Ambulatory Visit: Payer: Self-pay

## 2014-10-04 DIAGNOSIS — J45909 Unspecified asthma, uncomplicated: Secondary | ICD-10-CM

## 2014-10-04 NOTE — Patient Outreach (Signed)
[facility]) Care Management  10/04/2014  Sabrina Morrison 08-04-1930 [id number]    [facility] Telephonic Care Management Coordinator Outreach call to patient  Referral Date: 08/17/14   Referral Source:  [facility] Delegation Triage Screening Date:  08/25/14          Case Management  08/25/2014  Acuity Level:  3 Last contact date for care coordination services:  08/31/14 and 09/06/14 Initial Assessment Date:  08/31/2014 Introductory Package Date: 08/31/14 Case Status:  Active Consent:  verbal consent 08/25/14, 08/31/14 and 10/04/2014 (No written consent mailed back from patient to date.   Primary MD: [facility]:  Dr. Mitzie Na. Quillian Quince - last appt 08/26/14 and  next Appt: 10/18/14 Gastroenterologist: Daneil Dolin, MD - last appt 08/11/14 Allergist/ASTHMA Specialist: Dr. Parks Ranger - next appt 10/05/14 Urologist: Dr. Jeanelle Malling -   Chiropractor: [facility], Dr. Minette Brine. Dabbs -  [phone]  Mammogram: End of May 2016 DME:  Nebulizer ER Visits:  2   Denies any Inpt. Admissions over the past year but has required ED visits x 2.   Psycho/SOCIAL / SAFETY:  Widowed and lives alone and has no support system other than calling on friends at times of need. Patient is estranged from her adult children. Denies any falls over the past year. H/o reported episode of safety concerns over the past year while dealing with illness and was too sick to call 911. Patient currently does not have safety alert system in place. Patient verbalized that her church does little to assist widows or provide transportation assistance.  H/o active with [facility]: [address] [place], [name], [address] for management of anxiety and depression. Patient denied any depression this call.  DME: Nebulizer, scales for weighing, reading glasses Emergency Contact: None - patient states she has no one at this time.  RN CM discussed issue with non-adherence to Social Work Service  Referral over the past month.  Patient states reason for non-adherence is because she spoke with her MD office who was not sure what she was talking about and was instructed it is just a way for her Health Plan to raise it's premiums.   RN CM clarified that referral was to assist patient and address needs previously discussed on previous call re:   -Resources for widows -Adult nurse CM clarified that SW service provided free of charge to patient through [facility] as [name] has contracted for [facility] services to make sure patients care coordination needs are identified and information provided to patient for any identified needs or resources.  Patient verbalized understanding and agreed to [place] rather than a community visit..   ASTHMA:  H/o asthma requiring ED visit 08/04/2014 to [place] in the home but only uses as needed; on average 1-3 times per week.  NO home oxygen prescribed.  H/o NOT using inhalers due to past side effects of near fainting episodes she relates to the inhalers, (QVAR and another inhaler which she is not able to recall the name of.) Patient states she uses no inhalers other than her nebulizer. Denies any unmanaged symptoms over the past month.     Next primary care follow-up appointment scheduled for 10/18/2014.  Allergist/ASTHMA Specialist: Dr. Parks Ranger - next appt 10/05/14 (co-pays $45.00). RN CM encouraged compliance with keeping follow-up MD appt.  RN CM reviewed  self management interventions for ASTHMA management.    UROLOGY/BLADDER: Patient states concern regarding frequent  UTI.  States plans to discuss possible prophylactic management program on next MD visit.  States she has a friend who takes antibiotic daily for same issues.  Urologist, Dr. Exie Parody  RN CM encouraged patient to discuss with MD and plan that is right for her.   FIBROMYALGIA:  H/o use of  alternative  services Dietitian) every other week (co-pay $20.00) for mgmt of fibromyalgia. States MD, Dr. Bethann Goo  scheduled her appt out one month due to not having insurance authorization completed on last visit.  Patient states she was billed for last visit and owes $29.00 due to insurance not approving in a timely fashion.  States she plans to complete appeal process.  Currently has received authorization for 6 visits from 09/10/14 - 03/10/15.  Patient confirms Dr. Bethann Goo office has been provided a copy of this authorization.   States Medication regime updated for pain management needs.  Cymbalta 30mg  every morning and 60mg  every night.  States she is sleeping better. Chiropractor: [facility], Dr. Minette Brine. Dabbs -  [phone]    RN CM proved education on importance of sleeping as a self management intervention for pain management.  RN CM encouraged to take medication as prescribed and report any side effects to MD.   Consent:  verbal consent 08/25/14, 08/31/14 and 10/04/2014 (No written consent mailed back from patient to date).  RN CM will send 2nd request for written consent.  RN CM will follow-up again next call on completion of 2nd mailing of consent mailed on 10/04/2014  Plan:  Patient agrees to [place] Management Coordinator Services and to next appointment call for care coordination services with social work services.  RN CM identifies barriers to care: Transportation, Keeping MD appt's, safety.   RN CM will continue to monitor and provide patient with supportive interventions.  Social Work referral for  -home safety alert system eligibility and resource options originally sent on 08/25/2014 -community resources for support of widows originally sent on 08/25/2014.  -transportation needs to medical appt's. originally sent on 08/31/2014 -(New request for telephonic Social Work outreach only on 10/04/2014) RN CM  provided name and contact # for questions or concerns. RN CM instructed patient  to notify MD of any changes in her condition and call 911 for any urgent needs. Encouraged communication with MD over ED visits when appropriate.  RN CM will follow for Level 3 Disease Management Services and Care Coordination Services (SW).  Care Coordination:  - Social Work Hospital doctor - -(New request for telephonic Social Work outreach only on 10/04/2014.  Please continue outreach to member.  RN CM attempted to clarify goals of care in Telephonic Contact call on 10/04/2014) - PCP update re:  Social Work Regulatory affairs officer, RN, BSN, [facility], CCM  [employer] [phone] Office [phone] Direct [phone] Cell

## 2014-10-05 DIAGNOSIS — J4540 Moderate persistent asthma, uncomplicated: Secondary | ICD-10-CM | POA: Diagnosis not present

## 2014-10-06 ENCOUNTER — Other Ambulatory Visit: Payer: Self-pay | Admitting: Licensed Clinical Social Worker

## 2014-10-06 NOTE — Patient Outreach (Signed)
   Assessment: CSW spoke via phone with Sabrina Morrison on 10/06/14.  CSW verified identity of Sabrina Morrison. CSW received verbal permission from Sabrina Morrison to speak with her about the medical and mental health needs of client.  CSW spoke with Sabrina Morrison about RN Sabrina Morrison's recent phone call to client.  CSW spoke with Sabrina Morrison about the services of [employer] and CSW spoke of [name] consent form.  CSW discussed [name] on 10/06/14.  CSW informed Sabrina Morrison that [name] office representative would again be mailing her a [facility] consent form and return envelope to review.  Sabrina Morrison and CSW completed financial assessment form for client on 10/06/14.  Client does not currently receive Medicaid. Her only income is her monthly Social Security income.  She talked with CSW about managing her finances well and trying to use her money well to pay monthly expenses due.  She said she worked until she was 81 years of age. CSW gave Sabrina Morrison the name of [employer] (food pantry) in [name], [name] and also gave Sabrina Morrison number of that agency [phone]).  Client said she obtains some food resources two times monthly form [name].   CSW talked with Sabrina Morrison about transport needs to her scheduled medical appointments. CSW encouraged Sabrina Morrison to call [name] benefits number to talk with Sabrina Morrison representative about [name] transportation benefits for which client might qualify.  Client was not familiar with this resource and said she would call [name] benefits number to inquire about possible transport benefits for herself.  CSW and Sabrina Morrison completed additional assessment information needed.  CSW and Sabrina Morrison completed PHQ2 and PHQ9 for client on 10/06/14.  Client said she had gone to [name] for counseling support several years ago.  She said she was not currently going to counseling support. CSW informed client of the behavioral health benefit through Sabrina Morrison ([name]).   CSW described to client on 10/06/14 that client possibly could qualify to receive scheduled behavioral health calls form [name] behavioral health case manager.  Sabrina Morrison heard information from Sabrina Morrison on 10/06/14 related to [place] Morrison behavioral health referral; but, Sabrina Morrison denied this referral saying "I am not interested in that."  She informed CSW on 10/06/14 that she was not interested in receiving phone calls from a [name] behavioral health case manager.  CSW asked Sabrina Morrison if she had talked with Sabrina Morrison, her primary doctor, about counseling support services. She said she had not talked with Sabrina Morrison about counseling options.  She did not seem to indicate interest in pursuing counseling support services at present.  CSW thanked Sabrina Morrison for phone call with CSW on 10/06/14.  Sabrina Morrison thanked CSW for phone call to her on 10/06/14.   Plan: Client to review and complete [facility] consent form received and return completed consent form to [facility] office in [place], [name]. CSW to call client in two weeks to assess needs of client at that time. CSW to inform RN Sabrina Morrison on 10/06/14 of the above information.   Sabrina Morrison.Sabrina Morrison MSW, LCSW Licensed Clinical Social Worker [facility] Care Management [phone]

## 2014-10-07 NOTE — Patient Outreach (Signed)
[facility]) Care Management  10/07/2014  Sabrina Morrison [birth year] [id number]   Care Coordination Note: RN CM received update from [facility] SW that SW referral note completed.  RN CM reviewed SW note dated 10/06/14 for updates.   [name], RN, BSN, [facility], CCM  [employer] Management Coordinator [phone] Office [phone] Direct [phone] Cell

## 2014-10-11 DIAGNOSIS — E039 Hypothyroidism, unspecified: Secondary | ICD-10-CM | POA: Diagnosis not present

## 2014-10-11 DIAGNOSIS — E782 Mixed hyperlipidemia: Secondary | ICD-10-CM | POA: Diagnosis not present

## 2014-10-11 DIAGNOSIS — K210 Gastro-esophageal reflux disease with esophagitis: Secondary | ICD-10-CM | POA: Diagnosis not present

## 2014-10-11 DIAGNOSIS — D649 Anemia, unspecified: Secondary | ICD-10-CM | POA: Diagnosis not present

## 2014-10-11 DIAGNOSIS — D510 Vitamin B12 deficiency anemia due to intrinsic factor deficiency: Secondary | ICD-10-CM | POA: Diagnosis not present

## 2014-10-15 DIAGNOSIS — E539 Vitamin B deficiency, unspecified: Secondary | ICD-10-CM | POA: Diagnosis not present

## 2014-10-18 DIAGNOSIS — E539 Vitamin B deficiency, unspecified: Secondary | ICD-10-CM | POA: Diagnosis not present

## 2014-10-18 DIAGNOSIS — J301 Allergic rhinitis due to pollen: Secondary | ICD-10-CM | POA: Diagnosis not present

## 2014-10-18 DIAGNOSIS — K219 Gastro-esophageal reflux disease without esophagitis: Secondary | ICD-10-CM | POA: Diagnosis not present

## 2014-10-18 DIAGNOSIS — D510 Vitamin B12 deficiency anemia due to intrinsic factor deficiency: Secondary | ICD-10-CM | POA: Diagnosis not present

## 2014-10-18 DIAGNOSIS — E039 Hypothyroidism, unspecified: Secondary | ICD-10-CM | POA: Diagnosis not present

## 2014-10-18 DIAGNOSIS — I10 Essential (primary) hypertension: Secondary | ICD-10-CM | POA: Diagnosis not present

## 2014-10-18 DIAGNOSIS — R300 Dysuria: Secondary | ICD-10-CM | POA: Diagnosis not present

## 2014-10-18 DIAGNOSIS — E782 Mixed hyperlipidemia: Secondary | ICD-10-CM | POA: Diagnosis not present

## 2014-10-18 DIAGNOSIS — M797 Fibromyalgia: Secondary | ICD-10-CM | POA: Diagnosis not present

## 2014-10-19 DIAGNOSIS — N3941 Urge incontinence: Secondary | ICD-10-CM | POA: Diagnosis not present

## 2014-10-19 DIAGNOSIS — N905 Atrophy of vulva: Secondary | ICD-10-CM | POA: Diagnosis not present

## 2014-10-19 DIAGNOSIS — R3915 Urgency of urination: Secondary | ICD-10-CM | POA: Diagnosis not present

## 2014-10-25 ENCOUNTER — Other Ambulatory Visit: Payer: Self-pay

## 2014-10-25 ENCOUNTER — Telehealth: Payer: Self-pay
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
## 2014-10-25 NOTE — Patient Outreach (Signed)
[facility] [facility]) Care Management  10/25/2014  FARRON LAFOND 1930/09/08 [id number]   Research completed:   Educational Materials sent to patient on 10/25/2014 -Emmi:  Elder Care - Resources  -U.S. Department of Health and Copywriter, advertising for [employer] (ACL) -What is Elder Abuse?   GrandLunch.it.aspx#warning -What If I suspect Abuse, Neglect, or Exploitation? ExcitingPage.co.za.aspx  Cox Communications, Hotlines, and Referral Sources BaldSociety.ca.aspx?state_id=[place]  [place], [place] [address] WHQPR:[phone] Website: http://www.co.[place].us/pView.aspx?id=14850&catid=407   Protection of the Abused, Neglected, or Exploited Disabled Adult Act. [place] General Statutes - Chapter 108A Article 6 http://www.ncga.state.[place].us/EnactedLegislation/Statutes/PDF/ByArticle/Chapter_108A/Article_6.pdf   Plan:   RN CM will contact patient to provide the above updates and advise in mailings.  [name], RN, BSN, [facility], CCM  [employer] Management Coordinator [phone] Office [phone] Direct [phone] Cell

## 2014-10-25 NOTE — Patient Outreach (Signed)
[facility] [facility]) Care Management  10/25/2014  Sabrina Morrison 17-Sep-1930 [id number]   Care Coordination Note: Outbound call #1 to patient.  Patient not reached.  No answer following multiple rings.   Rescheduled for next outreach within the next 1-2 days.    [name], RN, BSN, [facility], CCM  [employer] Management Coordinator [phone] Office [phone] Direct [phone] Cell

## 2014-10-26 ENCOUNTER — Ambulatory Visit: Payer: Self-pay | Admitting: Gastroenterology

## 2014-10-27 ENCOUNTER — Ambulatory Visit (HOSPITAL_COMMUNITY): Payer: Self-pay

## 2014-10-29 DIAGNOSIS — E539 Vitamin B deficiency, unspecified: Secondary | ICD-10-CM | POA: Diagnosis not present

## 2014-11-02 ENCOUNTER — Ambulatory Visit: Payer: Commercial Managed Care - HMO

## 2014-11-02 DIAGNOSIS — M9903 Segmental and somatic dysfunction of lumbar region: Secondary | ICD-10-CM | POA: Diagnosis not present

## 2014-11-02 DIAGNOSIS — M47814 Spondylosis without myelopathy or radiculopathy, thoracic region: Secondary | ICD-10-CM | POA: Diagnosis not present

## 2014-11-02 DIAGNOSIS — M47816 Spondylosis without myelopathy or radiculopathy, lumbar region: Secondary | ICD-10-CM | POA: Diagnosis not present

## 2014-11-02 DIAGNOSIS — M47812 Spondylosis without myelopathy or radiculopathy, cervical region: Secondary | ICD-10-CM | POA: Diagnosis not present

## 2014-11-02 DIAGNOSIS — M9901 Segmental and somatic dysfunction of cervical region: Secondary | ICD-10-CM | POA: Diagnosis not present

## 2014-11-02 DIAGNOSIS — M4004 Postural kyphosis, thoracic region: Secondary | ICD-10-CM | POA: Diagnosis not present

## 2014-11-02 DIAGNOSIS — M9902 Segmental and somatic dysfunction of thoracic region: Secondary | ICD-10-CM | POA: Diagnosis not present

## 2014-11-02 DIAGNOSIS — M797 Fibromyalgia: Secondary | ICD-10-CM | POA: Diagnosis not present

## 2014-11-03 ENCOUNTER — Other Ambulatory Visit: Payer: Commercial Managed Care - HMO

## 2014-11-03 NOTE — Patient Outreach (Signed)
[facility]) Care Management  11/03/2014  Sabrina Morrison 1931-05-16 [id number]    Telephonic Care Coordination Note:   Outreach call #1.  Patient not reached; no answer and no option to leave message.   Rescheduled for next contact call within 3 business days.     [name], RN, BSN, [facility], CCM  [employer] Management Coordinator [phone] Office [phone] Direct [phone] Cell

## 2014-11-04 ENCOUNTER — Encounter: Payer: Self-pay | Admitting: Licensed Clinical Social Worker

## 2014-11-04 NOTE — Patient Outreach (Signed)
Sabrina Morrison [facility]) Care Management  [facility] Social Work  11/04/2014  Sabrina Morrison 08/25/1930 [id number]  Subjective:    Objective:   Current Medications:  Current Outpatient Prescriptions  Medication Sig Dispense Refill  . acetaminophen (TYLENOL) 500 MG tablet Take 500 mg by mouth every 6 (six) hours as needed. pain    . albuterol (PROVENTIL HFA;VENTOLIN HFA) 108 (90 BASE) MCG/ACT inhaler Inhale into the lungs every 6 (six) hours as needed for wheezing or shortness of breath (using 1-3 times per week as needed).    . Alpha-D-Galactosidase (BEANO PO) Take by mouth daily.    . beclomethasone (QVAR) 80 MCG/ACT inhaler Inhale 2 puffs into the lungs 2 (two) times daily. Shortness of breath    . CARAFATE 1 GM/10ML suspension Take 1 g by mouth as needed.     . Cranberry 500 MG CAPS Take 500 mg by mouth daily.    . cyanocobalamin (,VITAMIN B-12,) 1000 MCG/ML injection Inject 1,000 mcg into the muscle every 14 (fourteen) days.    Marland Kitchen dexlansoprazole (DEXILANT) 60 MG capsule Take 1 capsule (60 mg total) by mouth daily. 30 capsule 1  . diphenoxylate-atropine (LOMOTIL) 2.5-0.025 MG per tablet Take 1 tablet by mouth 4 (four) times daily as needed. diarrhea    . DULERA 200-5 MCG/ACT AERO Inhale 2 puffs into the lungs 2 (two) times daily.     . DULoxetine (CYMBALTA) 30 MG capsule Take 30 mg by mouth daily after breakfast.    . DULoxetine (CYMBALTA) 60 MG capsule Take 60 mg by mouth at bedtime.    Marland Kitchen EPIPEN 2-PAK 0.3 MG/0.3ML DEVI Inject 0.3 mg as directed as needed. Allergic Reaction    . fluticasone (FLONASE) 50 MCG/ACT nasal spray Place 2 sprays into the nose daily as needed. Congestion    . HYDROcodone-acetaminophen (NORCO/VICODIN) 5-325 MG per tablet as needed.    . lansoprazole (PREVACID) 30 MG capsule Take 30 mg by mouth 2 (two) times daily.     Marland Kitchen levothyroxine (SYNTHROID, LEVOTHROID) 100 MCG tablet Take 100 mcg by mouth daily before breakfast.     . levothyroxine (SYNTHROID,  LEVOTHROID) 112 MCG tablet Take 112 mcg by mouth daily before breakfast.    . mirabegron ER (MYRBETRIQ) 25 MG TB24 tablet Take 25 mg by mouth daily.    . ondansetron (ZOFRAN) 4 MG tablet Take 1 tablet (4 mg total) by mouth every 6 (six) hours as needed for nausea or vomiting. 20 tablet 0  . prochlorperazine (COMPAZINE) 5 MG tablet Take 5 mg by mouth every 6 (six) hours as needed. Nausea,vomiting    . Simethicone (GAS-X PO) Take 180 mg by mouth 4 (four) times daily as needed.     . solifenacin (VESICARE) 5 MG tablet Take 5 mg by mouth daily. Take 1/2 tablet for a total of 2.5mg  every day.    . traMADol (ULTRAM) 50 MG tablet     . [DISCONTINUED] calcium carbonate (OS-CAL) 600 MG TABS Take 600 mg by mouth 2 (two) times daily with a meal.       No current facility-administered medications for this visit.    Functional Status:  In your present state of health, do you have any difficulty performing the following activities: 10/04/2014 08/31/2014  Hearing? N N  Vision? N N  Difficulty concentrating or making decisions? N N  Walking or climbing stairs? N N  Dressing or bathing? N N  Doing errands, shopping? N N  Preparing Food and eating ? N N  Using the  Toilet? N N  In the past six months, have you accidently leaked urine? N Y  Do you have problems with loss of bowel control? N N  Managing your Medications? N N  Managing your Finances? N N  Housekeeping or managing your Housekeeping? N N    Fall/Depression Screening:  PHQ 2/9 Scores 10/06/2014 10/04/2014 08/31/2014 08/31/2014 08/31/2014  PHQ - 2 Score 2 0 - 0 0  PHQ- 9 Score 4 - - - -  Exception Documentation - - Other- indicate reason in comment box - -    Assessment:   CSW spoke via phone with Bary Castilla RN on 11/04/14 regarding case of client and failure of client to return [facility] consent form, though sent out to client multiple times and discussed with client multiple times. Bary Castilla encouraged CSW to close case on client due to fact  that client has not agreed to complete/return needed [facility] consent form.  Thus, CSW is discharging client, closing case on client on 11/04/14 since client has not returned a completed [facility] consent form to [facility] office in [place].     Plan:   CSW to send physician case closure letter to primary doctor for client on 11/04/14 informing doctor that case is closed due to client's not returning a completed [facility] consent form.  CSW to inform Lurline Del on 11/04/14 that CSW discharged client/closed case for client on 11/04/14 due to client's not completing [facility] consent form.  Norva Riffle.Tishina Lown MSW, LCSW Licensed Clinical Social Worker [facility] Care Management [phone]

## 2014-11-08 ENCOUNTER — Other Ambulatory Visit: Payer: Commercial Managed Care - HMO

## 2014-11-08 DIAGNOSIS — R300 Dysuria: Secondary | ICD-10-CM | POA: Diagnosis not present

## 2014-11-08 DIAGNOSIS — N390 Urinary tract infection, site not specified: Secondary | ICD-10-CM | POA: Diagnosis not present

## 2014-11-08 NOTE — Patient Outreach (Signed)
[place] [facility]) Care Management  11/08/2014  Sabrina Morrison 02/01/1931 [id number]   Telephonic Care Coordination Note:   Outreach call #2. Patient not reached; HIPAA compliant voice mail message left.   Rescheduled for next contact call within 3 business days.    [name], RN, BSN, [facility], CCM  [employer] Management Coordinator [phone] Office [phone] Direct [phone] Cell

## 2014-11-10 ENCOUNTER — Other Ambulatory Visit: Payer: Commercial Managed Care - HMO

## 2014-11-10 ENCOUNTER — Ambulatory Visit: Payer: Self-pay | Admitting: Gastroenterology

## 2014-11-10 NOTE — Patient Outreach (Signed)
[facility]) Care Management  11/10/2014  Sabrina Morrison Apr 26, 1931 [id number]   Telephonic Care Coordination Note:   Outreach call #3. Patient not reached; HIPAA compliant voice mail message left with contact information for call back.  RN CM will send unsuccessful outreach letter to patient.  RN CM will close case if no response back within 10 business days.   [name], RN, BSN, [facility], CCM  [employer] Management Coordinator [phone] Office [phone] Direct [phone] Cell

## 2014-11-10 NOTE — Patient Outreach (Signed)
[facility]) Care Management  11/10/2014  Sabrina Morrison [birth year] [id number]   Unsuccessful outreach letter sent 11/10/2014 (see letters tab).   [name], RN, BSN, [facility], CCM  [employer] Management Coordinator [phone] Office [phone] Direct [phone] Cell

## 2014-11-12 DIAGNOSIS — E539 Vitamin B deficiency, unspecified: Secondary | ICD-10-CM | POA: Diagnosis not present

## 2014-11-15 ENCOUNTER — Ambulatory Visit (HOSPITAL_COMMUNITY): Payer: Self-pay

## 2014-11-15 ENCOUNTER — Ambulatory Visit (HOSPITAL_COMMUNITY)
Admission: RE | Admit: 2014-11-15 | Discharge: 2014-11-15 | Disposition: A | Discharge status: Home / Self Care | Payer: Commercial Managed Care - HMO | Source: Ambulatory Visit | Attending: Family Medicine | Admitting: Family Medicine

## 2014-11-15 DIAGNOSIS — Z1231 Encounter for screening mammogram for malignant neoplasm of breast: Secondary | ICD-10-CM | POA: Diagnosis not present

## 2014-11-19 ENCOUNTER — Ambulatory Visit: Payer: Commercial Managed Care - HMO

## 2014-11-22 DIAGNOSIS — R300 Dysuria: Secondary | ICD-10-CM | POA: Diagnosis not present

## 2014-11-22 DIAGNOSIS — N3090 Cystitis, unspecified without hematuria: Secondary | ICD-10-CM | POA: Diagnosis not present

## 2014-11-23 DIAGNOSIS — R3915 Urgency of urination: Secondary | ICD-10-CM | POA: Diagnosis not present

## 2014-11-23 DIAGNOSIS — N8110 Cystocele, unspecified: Secondary | ICD-10-CM | POA: Diagnosis not present

## 2014-11-23 DIAGNOSIS — N905 Atrophy of vulva: Secondary | ICD-10-CM | POA: Diagnosis not present

## 2014-11-23 DIAGNOSIS — Z87440 Personal history of urinary (tract) infections: Secondary | ICD-10-CM | POA: Diagnosis not present

## 2014-11-26 DIAGNOSIS — E539 Vitamin B deficiency, unspecified: Secondary | ICD-10-CM | POA: Diagnosis not present

## 2014-11-29 NOTE — Patient Outreach (Signed)
[place] [facility]) Care Management  11/29/2014  TAYLAN MAYHAN 09/17/1930 [id number]   Letter sent to Dr. Mitzie Na. Daniel regarding Case Closure Notification:  Patient unable to reach. (See Media Tab)   [name], RN, BSN, [facility] Management Care Management Coordinator [phone] Office [phone] Direct [phone] Cell

## 2014-11-29 NOTE — Patient Outreach (Signed)
[place] [facility]) Care Management  11/29/2014  JENEAL VOGL December 19, 1930 [id number]   H/o patient unable to reach.  No response received back from patient following unsuccessful outreach letter sent to patient on 11/10/14.    In-box message sent to administrative assistance to request Case Closure:  No longer able to reach patient; patient was active and agreed to services but patient not responding to follow-up call attempts. No further Epic updates noted.  RN CM will notify Dr. Mitzie Na. Daniel via letter notification.    [name], RN, BSN, [facility], CCM  [employer] Management Coordinator [phone] Office [phone] Direct [phone] Cell

## 2014-11-30 DIAGNOSIS — M9901 Segmental and somatic dysfunction of cervical region: Secondary | ICD-10-CM | POA: Diagnosis not present

## 2014-11-30 DIAGNOSIS — M797 Fibromyalgia: Secondary | ICD-10-CM | POA: Diagnosis not present

## 2014-11-30 DIAGNOSIS — M47814 Spondylosis without myelopathy or radiculopathy, thoracic region: Secondary | ICD-10-CM | POA: Diagnosis not present

## 2014-11-30 DIAGNOSIS — M47816 Spondylosis without myelopathy or radiculopathy, lumbar region: Secondary | ICD-10-CM | POA: Diagnosis not present

## 2014-11-30 DIAGNOSIS — M47812 Spondylosis without myelopathy or radiculopathy, cervical region: Secondary | ICD-10-CM | POA: Diagnosis not present

## 2014-11-30 DIAGNOSIS — M4004 Postural kyphosis, thoracic region: Secondary | ICD-10-CM | POA: Diagnosis not present

## 2014-11-30 DIAGNOSIS — M9903 Segmental and somatic dysfunction of lumbar region: Secondary | ICD-10-CM | POA: Diagnosis not present

## 2014-11-30 DIAGNOSIS — M9902 Segmental and somatic dysfunction of thoracic region: Secondary | ICD-10-CM | POA: Diagnosis not present

## 2014-11-30 NOTE — Patient Outreach (Signed)
[place] [facility]) Care Management  11/30/2014  Sabrina Morrison March 27, 1931 [id number]   Notification from [name], RN to close case due to unable to contact patient.  [name]. [place], [place] Management [place] Assistant Phone: [phone] Fax: [phone]

## 2014-12-01 ENCOUNTER — Ambulatory Visit: Payer: Commercial Managed Care - HMO | Admitting: Gastroenterology

## 2014-12-09 DIAGNOSIS — E539 Vitamin B deficiency, unspecified: Secondary | ICD-10-CM | POA: Diagnosis not present

## 2014-12-24 DIAGNOSIS — E539 Vitamin B deficiency, unspecified: Secondary | ICD-10-CM | POA: Diagnosis not present

## 2015-01-07 DIAGNOSIS — E539 Vitamin B deficiency, unspecified: Secondary | ICD-10-CM | POA: Diagnosis not present

## 2015-01-15 DIAGNOSIS — M810 Age-related osteoporosis without current pathological fracture: Secondary | ICD-10-CM | POA: Diagnosis not present

## 2015-01-15 DIAGNOSIS — M7061 Trochanteric bursitis, right hip: Secondary | ICD-10-CM | POA: Diagnosis not present

## 2015-01-15 DIAGNOSIS — M545 Low back pain: Secondary | ICD-10-CM | POA: Diagnosis not present

## 2015-01-15 DIAGNOSIS — M797 Fibromyalgia: Secondary | ICD-10-CM | POA: Diagnosis not present

## 2015-01-21 DIAGNOSIS — E539 Vitamin B deficiency, unspecified: Secondary | ICD-10-CM | POA: Diagnosis not present

## 2015-01-24 DIAGNOSIS — I10 Essential (primary) hypertension: Secondary | ICD-10-CM | POA: Diagnosis not present

## 2015-01-24 DIAGNOSIS — E782 Mixed hyperlipidemia: Secondary | ICD-10-CM | POA: Diagnosis not present

## 2015-01-24 DIAGNOSIS — D649 Anemia, unspecified: Secondary | ICD-10-CM | POA: Diagnosis not present

## 2015-01-24 DIAGNOSIS — E039 Hypothyroidism, unspecified: Secondary | ICD-10-CM | POA: Diagnosis not present

## 2015-01-31 DIAGNOSIS — K219 Gastro-esophageal reflux disease without esophagitis: Secondary | ICD-10-CM | POA: Diagnosis not present

## 2015-01-31 DIAGNOSIS — E039 Hypothyroidism, unspecified: Secondary | ICD-10-CM | POA: Diagnosis not present

## 2015-01-31 DIAGNOSIS — Z23 Encounter for immunization: Secondary | ICD-10-CM | POA: Diagnosis not present

## 2015-01-31 DIAGNOSIS — E539 Vitamin B deficiency, unspecified: Secondary | ICD-10-CM | POA: Diagnosis not present

## 2015-01-31 DIAGNOSIS — J301 Allergic rhinitis due to pollen: Secondary | ICD-10-CM | POA: Diagnosis not present

## 2015-01-31 DIAGNOSIS — E782 Mixed hyperlipidemia: Secondary | ICD-10-CM | POA: Diagnosis not present

## 2015-01-31 DIAGNOSIS — I10 Essential (primary) hypertension: Secondary | ICD-10-CM | POA: Diagnosis not present

## 2015-01-31 DIAGNOSIS — D510 Vitamin B12 deficiency anemia due to intrinsic factor deficiency: Secondary | ICD-10-CM | POA: Diagnosis not present

## 2015-02-04 DIAGNOSIS — E539 Vitamin B deficiency, unspecified: Secondary | ICD-10-CM | POA: Diagnosis not present

## 2015-02-18 DIAGNOSIS — E539 Vitamin B deficiency, unspecified: Secondary | ICD-10-CM | POA: Diagnosis not present

## 2015-02-23 DIAGNOSIS — M8588 Other specified disorders of bone density and structure, other site: Secondary | ICD-10-CM | POA: Diagnosis not present

## 2015-02-23 DIAGNOSIS — M810 Age-related osteoporosis without current pathological fracture: Secondary | ICD-10-CM | POA: Diagnosis not present

## 2015-02-23 DIAGNOSIS — M85851 Other specified disorders of bone density and structure, right thigh: Secondary | ICD-10-CM | POA: Diagnosis not present

## 2015-02-23 DIAGNOSIS — Z780 Asymptomatic menopausal state: Secondary | ICD-10-CM | POA: Diagnosis not present

## 2015-02-23 DIAGNOSIS — M85852 Other specified disorders of bone density and structure, left thigh: Secondary | ICD-10-CM | POA: Diagnosis not present

## 2015-02-23 DIAGNOSIS — M85832 Other specified disorders of bone density and structure, left forearm: Secondary | ICD-10-CM | POA: Diagnosis not present

## 2015-02-23 DIAGNOSIS — Z79899 Other long term (current) drug therapy: Secondary | ICD-10-CM | POA: Diagnosis not present

## 2015-03-04 DIAGNOSIS — E539 Vitamin B deficiency, unspecified: Secondary | ICD-10-CM | POA: Diagnosis not present

## 2015-03-17 DIAGNOSIS — E539 Vitamin B deficiency, unspecified: Secondary | ICD-10-CM | POA: Diagnosis not present

## 2015-03-31 DIAGNOSIS — E539 Vitamin B deficiency, unspecified: Secondary | ICD-10-CM | POA: Diagnosis not present

## 2015-04-14 DIAGNOSIS — R110 Nausea: Secondary | ICD-10-CM | POA: Diagnosis not present

## 2015-04-14 DIAGNOSIS — R42 Dizziness and giddiness: Secondary | ICD-10-CM | POA: Diagnosis not present

## 2015-04-14 DIAGNOSIS — D510 Vitamin B12 deficiency anemia due to intrinsic factor deficiency: Secondary | ICD-10-CM | POA: Diagnosis not present

## 2015-04-25 DIAGNOSIS — R110 Nausea: Secondary | ICD-10-CM | POA: Diagnosis not present

## 2015-04-25 DIAGNOSIS — J069 Acute upper respiratory infection, unspecified: Secondary | ICD-10-CM | POA: Diagnosis not present

## 2015-04-29 DIAGNOSIS — E539 Vitamin B deficiency, unspecified: Secondary | ICD-10-CM | POA: Diagnosis not present

## 2015-05-13 DIAGNOSIS — E539 Vitamin B deficiency, unspecified: Secondary | ICD-10-CM | POA: Diagnosis not present

## 2015-05-19 DIAGNOSIS — N905 Atrophy of vulva: Secondary | ICD-10-CM | POA: Diagnosis not present

## 2015-05-19 DIAGNOSIS — N390 Urinary tract infection, site not specified: Secondary | ICD-10-CM | POA: Diagnosis not present

## 2015-05-19 DIAGNOSIS — R3915 Urgency of urination: Secondary | ICD-10-CM | POA: Diagnosis not present

## 2015-05-27 DIAGNOSIS — E539 Vitamin B deficiency, unspecified: Secondary | ICD-10-CM | POA: Diagnosis not present

## 2015-06-02 DIAGNOSIS — M25511 Pain in right shoulder: Secondary | ICD-10-CM | POA: Diagnosis not present

## 2015-06-02 DIAGNOSIS — M546 Pain in thoracic spine: Secondary | ICD-10-CM | POA: Diagnosis not present

## 2015-06-09 DIAGNOSIS — E539 Vitamin B deficiency, unspecified: Secondary | ICD-10-CM | POA: Diagnosis not present

## 2015-06-16 DIAGNOSIS — B373 Candidiasis of vulva and vagina: Secondary | ICD-10-CM | POA: Diagnosis not present

## 2015-06-16 DIAGNOSIS — E039 Hypothyroidism, unspecified: Secondary | ICD-10-CM | POA: Diagnosis not present

## 2015-06-16 DIAGNOSIS — S299XXA Unspecified injury of thorax, initial encounter: Secondary | ICD-10-CM | POA: Diagnosis not present

## 2015-06-16 DIAGNOSIS — T148 Other injury of unspecified body region: Secondary | ICD-10-CM | POA: Diagnosis not present

## 2015-06-16 DIAGNOSIS — W010XXA Fall on same level from slipping, tripping and stumbling without subsequent striking against object, initial encounter: Secondary | ICD-10-CM | POA: Diagnosis not present

## 2015-06-16 DIAGNOSIS — R279 Unspecified lack of coordination: Secondary | ICD-10-CM | POA: Diagnosis not present

## 2015-06-16 DIAGNOSIS — M797 Fibromyalgia: Secondary | ICD-10-CM | POA: Diagnosis not present

## 2015-06-16 DIAGNOSIS — R338 Other retention of urine: Secondary | ICD-10-CM | POA: Diagnosis not present

## 2015-06-16 DIAGNOSIS — Y92009 Unspecified place in unspecified non-institutional (private) residence as the place of occurrence of the external cause: Secondary | ICD-10-CM | POA: Diagnosis not present

## 2015-06-16 DIAGNOSIS — M25552 Pain in left hip: Secondary | ICD-10-CM | POA: Diagnosis not present

## 2015-06-16 DIAGNOSIS — Z7401 Bed confinement status: Secondary | ICD-10-CM | POA: Diagnosis not present

## 2015-06-16 DIAGNOSIS — K589 Irritable bowel syndrome without diarrhea: Secondary | ICD-10-CM | POA: Diagnosis not present

## 2015-06-16 DIAGNOSIS — R2689 Other abnormalities of gait and mobility: Secondary | ICD-10-CM | POA: Diagnosis not present

## 2015-06-16 DIAGNOSIS — Z96642 Presence of left artificial hip joint: Secondary | ICD-10-CM | POA: Diagnosis not present

## 2015-06-16 DIAGNOSIS — S72012A Unspecified intracapsular fracture of left femur, initial encounter for closed fracture: Secondary | ICD-10-CM | POA: Diagnosis not present

## 2015-06-16 DIAGNOSIS — S72002A Fracture of unspecified part of neck of left femur, initial encounter for closed fracture: Secondary | ICD-10-CM | POA: Diagnosis not present

## 2015-06-16 DIAGNOSIS — D62 Acute posthemorrhagic anemia: Secondary | ICD-10-CM | POA: Diagnosis not present

## 2015-06-16 DIAGNOSIS — Z471 Aftercare following joint replacement surgery: Secondary | ICD-10-CM | POA: Diagnosis not present

## 2015-06-16 DIAGNOSIS — K219 Gastro-esophageal reflux disease without esophagitis: Secondary | ICD-10-CM | POA: Diagnosis not present

## 2015-06-16 DIAGNOSIS — B9620 Unspecified Escherichia coli [E. coli] as the cause of diseases classified elsewhere: Secondary | ICD-10-CM | POA: Diagnosis not present

## 2015-06-16 DIAGNOSIS — W19XXXA Unspecified fall, initial encounter: Secondary | ICD-10-CM | POA: Diagnosis not present

## 2015-06-16 DIAGNOSIS — N390 Urinary tract infection, site not specified: Secondary | ICD-10-CM | POA: Diagnosis not present

## 2015-06-17 DIAGNOSIS — S72012A Unspecified intracapsular fracture of left femur, initial encounter for closed fracture: Secondary | ICD-10-CM | POA: Diagnosis not present

## 2015-06-21 DIAGNOSIS — Z96642 Presence of left artificial hip joint: Secondary | ICD-10-CM | POA: Diagnosis not present

## 2015-06-21 DIAGNOSIS — B9620 Unspecified Escherichia coli [E. coli] as the cause of diseases classified elsewhere: Secondary | ICD-10-CM | POA: Diagnosis not present

## 2015-06-21 DIAGNOSIS — S72012A Unspecified intracapsular fracture of left femur, initial encounter for closed fracture: Secondary | ICD-10-CM | POA: Diagnosis not present

## 2015-06-21 DIAGNOSIS — S72002A Fracture of unspecified part of neck of left femur, initial encounter for closed fracture: Secondary | ICD-10-CM | POA: Diagnosis not present

## 2015-06-21 DIAGNOSIS — R338 Other retention of urine: Secondary | ICD-10-CM | POA: Diagnosis not present

## 2015-06-21 DIAGNOSIS — N390 Urinary tract infection, site not specified: Secondary | ICD-10-CM | POA: Diagnosis not present

## 2015-06-21 DIAGNOSIS — D62 Acute posthemorrhagic anemia: Secondary | ICD-10-CM | POA: Diagnosis not present

## 2015-06-21 DIAGNOSIS — R279 Unspecified lack of coordination: Secondary | ICD-10-CM | POA: Diagnosis not present

## 2015-06-21 DIAGNOSIS — K219 Gastro-esophageal reflux disease without esophagitis: Secondary | ICD-10-CM | POA: Diagnosis not present

## 2015-06-21 DIAGNOSIS — Z7401 Bed confinement status: Secondary | ICD-10-CM | POA: Diagnosis not present

## 2015-06-21 DIAGNOSIS — M797 Fibromyalgia: Secondary | ICD-10-CM | POA: Diagnosis not present

## 2015-06-21 DIAGNOSIS — R2689 Other abnormalities of gait and mobility: Secondary | ICD-10-CM | POA: Diagnosis not present

## 2015-06-25 DIAGNOSIS — S72002A Fracture of unspecified part of neck of left femur, initial encounter for closed fracture: Secondary | ICD-10-CM | POA: Diagnosis not present

## 2015-06-28 DIAGNOSIS — S72002A Fracture of unspecified part of neck of left femur, initial encounter for closed fracture: Secondary | ICD-10-CM | POA: Diagnosis not present

## 2015-06-28 DIAGNOSIS — Z96642 Presence of left artificial hip joint: Secondary | ICD-10-CM | POA: Diagnosis not present

## 2015-07-11 DIAGNOSIS — M159 Polyosteoarthritis, unspecified: Secondary | ICD-10-CM | POA: Diagnosis not present

## 2015-07-11 DIAGNOSIS — S72012D Unspecified intracapsular fracture of left femur, subsequent encounter for closed fracture with routine healing: Secondary | ICD-10-CM | POA: Diagnosis not present

## 2015-07-11 DIAGNOSIS — E039 Hypothyroidism, unspecified: Secondary | ICD-10-CM | POA: Diagnosis not present

## 2015-07-11 DIAGNOSIS — N390 Urinary tract infection, site not specified: Secondary | ICD-10-CM | POA: Diagnosis not present

## 2015-07-11 DIAGNOSIS — I340 Nonrheumatic mitral (valve) insufficiency: Secondary | ICD-10-CM | POA: Diagnosis not present

## 2015-07-11 DIAGNOSIS — I350 Nonrheumatic aortic (valve) stenosis: Secondary | ICD-10-CM | POA: Diagnosis not present

## 2015-07-11 DIAGNOSIS — S72012A Unspecified intracapsular fracture of left femur, initial encounter for closed fracture: Secondary | ICD-10-CM | POA: Diagnosis not present

## 2015-07-11 DIAGNOSIS — I272 Other secondary pulmonary hypertension: Secondary | ICD-10-CM | POA: Diagnosis not present

## 2015-07-11 DIAGNOSIS — I360 Nonrheumatic tricuspid (valve) stenosis: Secondary | ICD-10-CM | POA: Diagnosis not present

## 2015-07-11 DIAGNOSIS — B9620 Unspecified Escherichia coli [E. coli] as the cause of diseases classified elsewhere: Secondary | ICD-10-CM | POA: Diagnosis not present

## 2015-07-11 DIAGNOSIS — M6281 Muscle weakness (generalized): Secondary | ICD-10-CM | POA: Diagnosis not present

## 2015-07-11 DIAGNOSIS — M797 Fibromyalgia: Secondary | ICD-10-CM | POA: Diagnosis not present

## 2015-07-11 DIAGNOSIS — D62 Acute posthemorrhagic anemia: Secondary | ICD-10-CM | POA: Diagnosis not present

## 2015-07-12 DIAGNOSIS — I340 Nonrheumatic mitral (valve) insufficiency: Secondary | ICD-10-CM | POA: Diagnosis not present

## 2015-07-12 DIAGNOSIS — M159 Polyosteoarthritis, unspecified: Secondary | ICD-10-CM | POA: Diagnosis not present

## 2015-07-12 DIAGNOSIS — I272 Other secondary pulmonary hypertension: Secondary | ICD-10-CM | POA: Diagnosis not present

## 2015-07-12 DIAGNOSIS — S72012D Unspecified intracapsular fracture of left femur, subsequent encounter for closed fracture with routine healing: Secondary | ICD-10-CM | POA: Diagnosis not present

## 2015-07-12 DIAGNOSIS — M6281 Muscle weakness (generalized): Secondary | ICD-10-CM | POA: Diagnosis not present

## 2015-07-12 DIAGNOSIS — I350 Nonrheumatic aortic (valve) stenosis: Secondary | ICD-10-CM | POA: Diagnosis not present

## 2015-07-12 DIAGNOSIS — E039 Hypothyroidism, unspecified: Secondary | ICD-10-CM | POA: Diagnosis not present

## 2015-07-12 DIAGNOSIS — I360 Nonrheumatic tricuspid (valve) stenosis: Secondary | ICD-10-CM | POA: Diagnosis not present

## 2015-07-12 DIAGNOSIS — M797 Fibromyalgia: Secondary | ICD-10-CM | POA: Diagnosis not present

## 2015-07-14 DIAGNOSIS — M6281 Muscle weakness (generalized): Secondary | ICD-10-CM | POA: Diagnosis not present

## 2015-07-14 DIAGNOSIS — E039 Hypothyroidism, unspecified: Secondary | ICD-10-CM | POA: Diagnosis not present

## 2015-07-14 DIAGNOSIS — I340 Nonrheumatic mitral (valve) insufficiency: Secondary | ICD-10-CM | POA: Diagnosis not present

## 2015-07-14 DIAGNOSIS — I272 Other secondary pulmonary hypertension: Secondary | ICD-10-CM | POA: Diagnosis not present

## 2015-07-14 DIAGNOSIS — M797 Fibromyalgia: Secondary | ICD-10-CM | POA: Diagnosis not present

## 2015-07-14 DIAGNOSIS — I350 Nonrheumatic aortic (valve) stenosis: Secondary | ICD-10-CM | POA: Diagnosis not present

## 2015-07-14 DIAGNOSIS — I360 Nonrheumatic tricuspid (valve) stenosis: Secondary | ICD-10-CM | POA: Diagnosis not present

## 2015-07-14 DIAGNOSIS — S72012D Unspecified intracapsular fracture of left femur, subsequent encounter for closed fracture with routine healing: Secondary | ICD-10-CM | POA: Diagnosis not present

## 2015-07-14 DIAGNOSIS — M159 Polyosteoarthritis, unspecified: Secondary | ICD-10-CM | POA: Diagnosis not present

## 2015-07-18 DIAGNOSIS — S72012D Unspecified intracapsular fracture of left femur, subsequent encounter for closed fracture with routine healing: Secondary | ICD-10-CM | POA: Diagnosis not present

## 2015-07-18 DIAGNOSIS — I360 Nonrheumatic tricuspid (valve) stenosis: Secondary | ICD-10-CM | POA: Diagnosis not present

## 2015-07-18 DIAGNOSIS — M797 Fibromyalgia: Secondary | ICD-10-CM | POA: Diagnosis not present

## 2015-07-18 DIAGNOSIS — I340 Nonrheumatic mitral (valve) insufficiency: Secondary | ICD-10-CM | POA: Diagnosis not present

## 2015-07-18 DIAGNOSIS — I350 Nonrheumatic aortic (valve) stenosis: Secondary | ICD-10-CM | POA: Diagnosis not present

## 2015-07-18 DIAGNOSIS — I272 Other secondary pulmonary hypertension: Secondary | ICD-10-CM | POA: Diagnosis not present

## 2015-07-18 DIAGNOSIS — M6281 Muscle weakness (generalized): Secondary | ICD-10-CM | POA: Diagnosis not present

## 2015-07-18 DIAGNOSIS — E039 Hypothyroidism, unspecified: Secondary | ICD-10-CM | POA: Diagnosis not present

## 2015-07-18 DIAGNOSIS — M159 Polyosteoarthritis, unspecified: Secondary | ICD-10-CM | POA: Diagnosis not present

## 2015-07-19 DIAGNOSIS — M6281 Muscle weakness (generalized): Secondary | ICD-10-CM | POA: Diagnosis not present

## 2015-07-19 DIAGNOSIS — I350 Nonrheumatic aortic (valve) stenosis: Secondary | ICD-10-CM | POA: Diagnosis not present

## 2015-07-19 DIAGNOSIS — M159 Polyosteoarthritis, unspecified: Secondary | ICD-10-CM | POA: Diagnosis not present

## 2015-07-19 DIAGNOSIS — S72012D Unspecified intracapsular fracture of left femur, subsequent encounter for closed fracture with routine healing: Secondary | ICD-10-CM | POA: Diagnosis not present

## 2015-07-19 DIAGNOSIS — I360 Nonrheumatic tricuspid (valve) stenosis: Secondary | ICD-10-CM | POA: Diagnosis not present

## 2015-07-19 DIAGNOSIS — I340 Nonrheumatic mitral (valve) insufficiency: Secondary | ICD-10-CM | POA: Diagnosis not present

## 2015-07-19 DIAGNOSIS — M797 Fibromyalgia: Secondary | ICD-10-CM | POA: Diagnosis not present

## 2015-07-19 DIAGNOSIS — E039 Hypothyroidism, unspecified: Secondary | ICD-10-CM | POA: Diagnosis not present

## 2015-07-19 DIAGNOSIS — I272 Other secondary pulmonary hypertension: Secondary | ICD-10-CM | POA: Diagnosis not present

## 2015-07-20 DIAGNOSIS — S72012D Unspecified intracapsular fracture of left femur, subsequent encounter for closed fracture with routine healing: Secondary | ICD-10-CM | POA: Diagnosis not present

## 2015-07-20 DIAGNOSIS — E039 Hypothyroidism, unspecified: Secondary | ICD-10-CM | POA: Diagnosis not present

## 2015-07-20 DIAGNOSIS — I340 Nonrheumatic mitral (valve) insufficiency: Secondary | ICD-10-CM | POA: Diagnosis not present

## 2015-07-20 DIAGNOSIS — I350 Nonrheumatic aortic (valve) stenosis: Secondary | ICD-10-CM | POA: Diagnosis not present

## 2015-07-20 DIAGNOSIS — M6281 Muscle weakness (generalized): Secondary | ICD-10-CM | POA: Diagnosis not present

## 2015-07-20 DIAGNOSIS — M797 Fibromyalgia: Secondary | ICD-10-CM | POA: Diagnosis not present

## 2015-07-20 DIAGNOSIS — I360 Nonrheumatic tricuspid (valve) stenosis: Secondary | ICD-10-CM | POA: Diagnosis not present

## 2015-07-20 DIAGNOSIS — M159 Polyosteoarthritis, unspecified: Secondary | ICD-10-CM | POA: Diagnosis not present

## 2015-07-20 DIAGNOSIS — I272 Other secondary pulmonary hypertension: Secondary | ICD-10-CM | POA: Diagnosis not present

## 2015-07-21 DIAGNOSIS — I340 Nonrheumatic mitral (valve) insufficiency: Secondary | ICD-10-CM | POA: Diagnosis not present

## 2015-07-21 DIAGNOSIS — I350 Nonrheumatic aortic (valve) stenosis: Secondary | ICD-10-CM | POA: Diagnosis not present

## 2015-07-21 DIAGNOSIS — M6281 Muscle weakness (generalized): Secondary | ICD-10-CM | POA: Diagnosis not present

## 2015-07-21 DIAGNOSIS — S72012D Unspecified intracapsular fracture of left femur, subsequent encounter for closed fracture with routine healing: Secondary | ICD-10-CM | POA: Diagnosis not present

## 2015-07-21 DIAGNOSIS — E039 Hypothyroidism, unspecified: Secondary | ICD-10-CM | POA: Diagnosis not present

## 2015-07-21 DIAGNOSIS — I272 Other secondary pulmonary hypertension: Secondary | ICD-10-CM | POA: Diagnosis not present

## 2015-07-21 DIAGNOSIS — M797 Fibromyalgia: Secondary | ICD-10-CM | POA: Diagnosis not present

## 2015-07-21 DIAGNOSIS — I360 Nonrheumatic tricuspid (valve) stenosis: Secondary | ICD-10-CM | POA: Diagnosis not present

## 2015-07-21 DIAGNOSIS — M159 Polyosteoarthritis, unspecified: Secondary | ICD-10-CM | POA: Diagnosis not present

## 2015-07-22 DIAGNOSIS — I340 Nonrheumatic mitral (valve) insufficiency: Secondary | ICD-10-CM | POA: Diagnosis not present

## 2015-07-22 DIAGNOSIS — M797 Fibromyalgia: Secondary | ICD-10-CM | POA: Diagnosis not present

## 2015-07-22 DIAGNOSIS — I272 Other secondary pulmonary hypertension: Secondary | ICD-10-CM | POA: Diagnosis not present

## 2015-07-22 DIAGNOSIS — I360 Nonrheumatic tricuspid (valve) stenosis: Secondary | ICD-10-CM | POA: Diagnosis not present

## 2015-07-22 DIAGNOSIS — S72012D Unspecified intracapsular fracture of left femur, subsequent encounter for closed fracture with routine healing: Secondary | ICD-10-CM | POA: Diagnosis not present

## 2015-07-22 DIAGNOSIS — M159 Polyosteoarthritis, unspecified: Secondary | ICD-10-CM | POA: Diagnosis not present

## 2015-07-22 DIAGNOSIS — M6281 Muscle weakness (generalized): Secondary | ICD-10-CM | POA: Diagnosis not present

## 2015-07-22 DIAGNOSIS — E039 Hypothyroidism, unspecified: Secondary | ICD-10-CM | POA: Diagnosis not present

## 2015-07-22 DIAGNOSIS — I350 Nonrheumatic aortic (valve) stenosis: Secondary | ICD-10-CM | POA: Diagnosis not present

## 2015-07-25 DIAGNOSIS — E039 Hypothyroidism, unspecified: Secondary | ICD-10-CM | POA: Diagnosis not present

## 2015-07-25 DIAGNOSIS — I340 Nonrheumatic mitral (valve) insufficiency: Secondary | ICD-10-CM | POA: Diagnosis not present

## 2015-07-25 DIAGNOSIS — M797 Fibromyalgia: Secondary | ICD-10-CM | POA: Diagnosis not present

## 2015-07-25 DIAGNOSIS — I272 Other secondary pulmonary hypertension: Secondary | ICD-10-CM | POA: Diagnosis not present

## 2015-07-25 DIAGNOSIS — I350 Nonrheumatic aortic (valve) stenosis: Secondary | ICD-10-CM | POA: Diagnosis not present

## 2015-07-25 DIAGNOSIS — M159 Polyosteoarthritis, unspecified: Secondary | ICD-10-CM | POA: Diagnosis not present

## 2015-07-25 DIAGNOSIS — S72012D Unspecified intracapsular fracture of left femur, subsequent encounter for closed fracture with routine healing: Secondary | ICD-10-CM | POA: Diagnosis not present

## 2015-07-25 DIAGNOSIS — M6281 Muscle weakness (generalized): Secondary | ICD-10-CM | POA: Diagnosis not present

## 2015-07-25 DIAGNOSIS — I360 Nonrheumatic tricuspid (valve) stenosis: Secondary | ICD-10-CM | POA: Diagnosis not present

## 2015-07-26 DIAGNOSIS — I340 Nonrheumatic mitral (valve) insufficiency: Secondary | ICD-10-CM | POA: Diagnosis not present

## 2015-07-26 DIAGNOSIS — S72012D Unspecified intracapsular fracture of left femur, subsequent encounter for closed fracture with routine healing: Secondary | ICD-10-CM | POA: Diagnosis not present

## 2015-07-26 DIAGNOSIS — I360 Nonrheumatic tricuspid (valve) stenosis: Secondary | ICD-10-CM | POA: Diagnosis not present

## 2015-07-26 DIAGNOSIS — E039 Hypothyroidism, unspecified: Secondary | ICD-10-CM | POA: Diagnosis not present

## 2015-07-26 DIAGNOSIS — I350 Nonrheumatic aortic (valve) stenosis: Secondary | ICD-10-CM | POA: Diagnosis not present

## 2015-07-26 DIAGNOSIS — M6281 Muscle weakness (generalized): Secondary | ICD-10-CM | POA: Diagnosis not present

## 2015-07-26 DIAGNOSIS — M797 Fibromyalgia: Secondary | ICD-10-CM | POA: Diagnosis not present

## 2015-07-26 DIAGNOSIS — I272 Other secondary pulmonary hypertension: Secondary | ICD-10-CM | POA: Diagnosis not present

## 2015-07-26 DIAGNOSIS — M159 Polyosteoarthritis, unspecified: Secondary | ICD-10-CM | POA: Diagnosis not present

## 2015-07-28 DIAGNOSIS — M6281 Muscle weakness (generalized): Secondary | ICD-10-CM | POA: Diagnosis not present

## 2015-07-28 DIAGNOSIS — I340 Nonrheumatic mitral (valve) insufficiency: Secondary | ICD-10-CM | POA: Diagnosis not present

## 2015-07-28 DIAGNOSIS — I350 Nonrheumatic aortic (valve) stenosis: Secondary | ICD-10-CM | POA: Diagnosis not present

## 2015-07-28 DIAGNOSIS — M159 Polyosteoarthritis, unspecified: Secondary | ICD-10-CM | POA: Diagnosis not present

## 2015-07-28 DIAGNOSIS — S72012D Unspecified intracapsular fracture of left femur, subsequent encounter for closed fracture with routine healing: Secondary | ICD-10-CM | POA: Diagnosis not present

## 2015-07-28 DIAGNOSIS — E039 Hypothyroidism, unspecified: Secondary | ICD-10-CM | POA: Diagnosis not present

## 2015-07-28 DIAGNOSIS — I360 Nonrheumatic tricuspid (valve) stenosis: Secondary | ICD-10-CM | POA: Diagnosis not present

## 2015-07-28 DIAGNOSIS — M797 Fibromyalgia: Secondary | ICD-10-CM | POA: Diagnosis not present

## 2015-07-28 DIAGNOSIS — I272 Other secondary pulmonary hypertension: Secondary | ICD-10-CM | POA: Diagnosis not present

## 2015-07-29 DIAGNOSIS — I272 Other secondary pulmonary hypertension: Secondary | ICD-10-CM | POA: Diagnosis not present

## 2015-07-29 DIAGNOSIS — M6281 Muscle weakness (generalized): Secondary | ICD-10-CM | POA: Diagnosis not present

## 2015-07-29 DIAGNOSIS — E039 Hypothyroidism, unspecified: Secondary | ICD-10-CM | POA: Diagnosis not present

## 2015-07-29 DIAGNOSIS — M159 Polyosteoarthritis, unspecified: Secondary | ICD-10-CM | POA: Diagnosis not present

## 2015-07-29 DIAGNOSIS — M797 Fibromyalgia: Secondary | ICD-10-CM | POA: Diagnosis not present

## 2015-07-29 DIAGNOSIS — I360 Nonrheumatic tricuspid (valve) stenosis: Secondary | ICD-10-CM | POA: Diagnosis not present

## 2015-07-29 DIAGNOSIS — I350 Nonrheumatic aortic (valve) stenosis: Secondary | ICD-10-CM | POA: Diagnosis not present

## 2015-07-29 DIAGNOSIS — I340 Nonrheumatic mitral (valve) insufficiency: Secondary | ICD-10-CM | POA: Diagnosis not present

## 2015-07-29 DIAGNOSIS — S72012D Unspecified intracapsular fracture of left femur, subsequent encounter for closed fracture with routine healing: Secondary | ICD-10-CM | POA: Diagnosis not present

## 2015-08-02 DIAGNOSIS — E039 Hypothyroidism, unspecified: Secondary | ICD-10-CM | POA: Diagnosis not present

## 2015-08-02 DIAGNOSIS — S72002A Fracture of unspecified part of neck of left femur, initial encounter for closed fracture: Secondary | ICD-10-CM | POA: Diagnosis not present

## 2015-08-02 DIAGNOSIS — M797 Fibromyalgia: Secondary | ICD-10-CM | POA: Diagnosis not present

## 2015-08-02 DIAGNOSIS — I272 Other secondary pulmonary hypertension: Secondary | ICD-10-CM | POA: Diagnosis not present

## 2015-08-02 DIAGNOSIS — I350 Nonrheumatic aortic (valve) stenosis: Secondary | ICD-10-CM | POA: Diagnosis not present

## 2015-08-02 DIAGNOSIS — S72012D Unspecified intracapsular fracture of left femur, subsequent encounter for closed fracture with routine healing: Secondary | ICD-10-CM | POA: Diagnosis not present

## 2015-08-02 DIAGNOSIS — M6281 Muscle weakness (generalized): Secondary | ICD-10-CM | POA: Diagnosis not present

## 2015-08-02 DIAGNOSIS — Z96642 Presence of left artificial hip joint: Secondary | ICD-10-CM | POA: Diagnosis not present

## 2015-08-02 DIAGNOSIS — I360 Nonrheumatic tricuspid (valve) stenosis: Secondary | ICD-10-CM | POA: Diagnosis not present

## 2015-08-02 DIAGNOSIS — M159 Polyosteoarthritis, unspecified: Secondary | ICD-10-CM | POA: Diagnosis not present

## 2015-08-02 DIAGNOSIS — I340 Nonrheumatic mitral (valve) insufficiency: Secondary | ICD-10-CM | POA: Diagnosis not present

## 2015-08-04 DIAGNOSIS — I350 Nonrheumatic aortic (valve) stenosis: Secondary | ICD-10-CM | POA: Diagnosis not present

## 2015-08-04 DIAGNOSIS — I340 Nonrheumatic mitral (valve) insufficiency: Secondary | ICD-10-CM | POA: Diagnosis not present

## 2015-08-04 DIAGNOSIS — I360 Nonrheumatic tricuspid (valve) stenosis: Secondary | ICD-10-CM | POA: Diagnosis not present

## 2015-08-04 DIAGNOSIS — S72012D Unspecified intracapsular fracture of left femur, subsequent encounter for closed fracture with routine healing: Secondary | ICD-10-CM | POA: Diagnosis not present

## 2015-08-04 DIAGNOSIS — M159 Polyosteoarthritis, unspecified: Secondary | ICD-10-CM | POA: Diagnosis not present

## 2015-08-04 DIAGNOSIS — M6281 Muscle weakness (generalized): Secondary | ICD-10-CM | POA: Diagnosis not present

## 2015-08-04 DIAGNOSIS — I272 Other secondary pulmonary hypertension: Secondary | ICD-10-CM | POA: Diagnosis not present

## 2015-08-04 DIAGNOSIS — E039 Hypothyroidism, unspecified: Secondary | ICD-10-CM | POA: Diagnosis not present

## 2015-08-04 DIAGNOSIS — M797 Fibromyalgia: Secondary | ICD-10-CM | POA: Diagnosis not present

## 2015-08-05 DIAGNOSIS — E539 Vitamin B deficiency, unspecified: Secondary | ICD-10-CM | POA: Diagnosis not present

## 2015-08-07 DIAGNOSIS — M797 Fibromyalgia: Secondary | ICD-10-CM | POA: Diagnosis not present

## 2015-08-07 DIAGNOSIS — K580 Irritable bowel syndrome with diarrhea: Secondary | ICD-10-CM | POA: Diagnosis not present

## 2015-08-07 DIAGNOSIS — K219 Gastro-esophageal reflux disease without esophagitis: Secondary | ICD-10-CM | POA: Diagnosis not present

## 2015-08-07 DIAGNOSIS — I10 Essential (primary) hypertension: Secondary | ICD-10-CM | POA: Diagnosis not present

## 2015-08-09 DIAGNOSIS — M797 Fibromyalgia: Secondary | ICD-10-CM | POA: Diagnosis not present

## 2015-08-09 DIAGNOSIS — S72012D Unspecified intracapsular fracture of left femur, subsequent encounter for closed fracture with routine healing: Secondary | ICD-10-CM | POA: Diagnosis not present

## 2015-08-09 DIAGNOSIS — I272 Other secondary pulmonary hypertension: Secondary | ICD-10-CM | POA: Diagnosis not present

## 2015-08-09 DIAGNOSIS — M6281 Muscle weakness (generalized): Secondary | ICD-10-CM | POA: Diagnosis not present

## 2015-08-09 DIAGNOSIS — I360 Nonrheumatic tricuspid (valve) stenosis: Secondary | ICD-10-CM | POA: Diagnosis not present

## 2015-08-09 DIAGNOSIS — I350 Nonrheumatic aortic (valve) stenosis: Secondary | ICD-10-CM | POA: Diagnosis not present

## 2015-08-09 DIAGNOSIS — M159 Polyosteoarthritis, unspecified: Secondary | ICD-10-CM | POA: Diagnosis not present

## 2015-08-09 DIAGNOSIS — E039 Hypothyroidism, unspecified: Secondary | ICD-10-CM | POA: Diagnosis not present

## 2015-08-09 DIAGNOSIS — I340 Nonrheumatic mitral (valve) insufficiency: Secondary | ICD-10-CM | POA: Diagnosis not present

## 2015-08-11 DIAGNOSIS — M159 Polyosteoarthritis, unspecified: Secondary | ICD-10-CM | POA: Diagnosis not present

## 2015-08-11 DIAGNOSIS — I340 Nonrheumatic mitral (valve) insufficiency: Secondary | ICD-10-CM | POA: Diagnosis not present

## 2015-08-11 DIAGNOSIS — I272 Other secondary pulmonary hypertension: Secondary | ICD-10-CM | POA: Diagnosis not present

## 2015-08-11 DIAGNOSIS — M6281 Muscle weakness (generalized): Secondary | ICD-10-CM | POA: Diagnosis not present

## 2015-08-11 DIAGNOSIS — I360 Nonrheumatic tricuspid (valve) stenosis: Secondary | ICD-10-CM | POA: Diagnosis not present

## 2015-08-11 DIAGNOSIS — E039 Hypothyroidism, unspecified: Secondary | ICD-10-CM | POA: Diagnosis not present

## 2015-08-11 DIAGNOSIS — S72012D Unspecified intracapsular fracture of left femur, subsequent encounter for closed fracture with routine healing: Secondary | ICD-10-CM | POA: Diagnosis not present

## 2015-08-11 DIAGNOSIS — I350 Nonrheumatic aortic (valve) stenosis: Secondary | ICD-10-CM | POA: Diagnosis not present

## 2015-08-11 DIAGNOSIS — M797 Fibromyalgia: Secondary | ICD-10-CM | POA: Diagnosis not present

## 2015-08-15 DIAGNOSIS — I360 Nonrheumatic tricuspid (valve) stenosis: Secondary | ICD-10-CM | POA: Diagnosis not present

## 2015-08-15 DIAGNOSIS — I350 Nonrheumatic aortic (valve) stenosis: Secondary | ICD-10-CM | POA: Diagnosis not present

## 2015-08-15 DIAGNOSIS — S72012D Unspecified intracapsular fracture of left femur, subsequent encounter for closed fracture with routine healing: Secondary | ICD-10-CM | POA: Diagnosis not present

## 2015-08-15 DIAGNOSIS — M159 Polyosteoarthritis, unspecified: Secondary | ICD-10-CM | POA: Diagnosis not present

## 2015-08-15 DIAGNOSIS — I272 Other secondary pulmonary hypertension: Secondary | ICD-10-CM | POA: Diagnosis not present

## 2015-08-15 DIAGNOSIS — M797 Fibromyalgia: Secondary | ICD-10-CM | POA: Diagnosis not present

## 2015-08-15 DIAGNOSIS — I340 Nonrheumatic mitral (valve) insufficiency: Secondary | ICD-10-CM | POA: Diagnosis not present

## 2015-08-15 DIAGNOSIS — M6281 Muscle weakness (generalized): Secondary | ICD-10-CM | POA: Diagnosis not present

## 2015-08-15 DIAGNOSIS — E039 Hypothyroidism, unspecified: Secondary | ICD-10-CM | POA: Diagnosis not present

## 2015-08-17 DIAGNOSIS — I272 Other secondary pulmonary hypertension: Secondary | ICD-10-CM | POA: Diagnosis not present

## 2015-08-17 DIAGNOSIS — E039 Hypothyroidism, unspecified: Secondary | ICD-10-CM | POA: Diagnosis not present

## 2015-08-17 DIAGNOSIS — M6281 Muscle weakness (generalized): Secondary | ICD-10-CM | POA: Diagnosis not present

## 2015-08-17 DIAGNOSIS — I340 Nonrheumatic mitral (valve) insufficiency: Secondary | ICD-10-CM | POA: Diagnosis not present

## 2015-08-17 DIAGNOSIS — I360 Nonrheumatic tricuspid (valve) stenosis: Secondary | ICD-10-CM | POA: Diagnosis not present

## 2015-08-17 DIAGNOSIS — S72012D Unspecified intracapsular fracture of left femur, subsequent encounter for closed fracture with routine healing: Secondary | ICD-10-CM | POA: Diagnosis not present

## 2015-08-17 DIAGNOSIS — M797 Fibromyalgia: Secondary | ICD-10-CM | POA: Diagnosis not present

## 2015-08-17 DIAGNOSIS — I350 Nonrheumatic aortic (valve) stenosis: Secondary | ICD-10-CM | POA: Diagnosis not present

## 2015-08-17 DIAGNOSIS — M159 Polyosteoarthritis, unspecified: Secondary | ICD-10-CM | POA: Diagnosis not present

## 2015-08-19 DIAGNOSIS — E539 Vitamin B deficiency, unspecified: Secondary | ICD-10-CM | POA: Diagnosis not present

## 2015-08-22 DIAGNOSIS — M797 Fibromyalgia: Secondary | ICD-10-CM | POA: Diagnosis not present

## 2015-08-22 DIAGNOSIS — I360 Nonrheumatic tricuspid (valve) stenosis: Secondary | ICD-10-CM | POA: Diagnosis not present

## 2015-08-22 DIAGNOSIS — M6281 Muscle weakness (generalized): Secondary | ICD-10-CM | POA: Diagnosis not present

## 2015-08-22 DIAGNOSIS — E039 Hypothyroidism, unspecified: Secondary | ICD-10-CM | POA: Diagnosis not present

## 2015-08-22 DIAGNOSIS — I272 Other secondary pulmonary hypertension: Secondary | ICD-10-CM | POA: Diagnosis not present

## 2015-08-22 DIAGNOSIS — M159 Polyosteoarthritis, unspecified: Secondary | ICD-10-CM | POA: Diagnosis not present

## 2015-08-22 DIAGNOSIS — I350 Nonrheumatic aortic (valve) stenosis: Secondary | ICD-10-CM | POA: Diagnosis not present

## 2015-08-22 DIAGNOSIS — S72012D Unspecified intracapsular fracture of left femur, subsequent encounter for closed fracture with routine healing: Secondary | ICD-10-CM | POA: Diagnosis not present

## 2015-08-22 DIAGNOSIS — I340 Nonrheumatic mitral (valve) insufficiency: Secondary | ICD-10-CM | POA: Diagnosis not present

## 2015-08-26 DIAGNOSIS — M25511 Pain in right shoulder: Secondary | ICD-10-CM | POA: Diagnosis not present

## 2015-08-29 DIAGNOSIS — M159 Polyosteoarthritis, unspecified: Secondary | ICD-10-CM | POA: Diagnosis not present

## 2015-08-29 DIAGNOSIS — E039 Hypothyroidism, unspecified: Secondary | ICD-10-CM | POA: Diagnosis not present

## 2015-08-29 DIAGNOSIS — D485 Neoplasm of uncertain behavior of skin: Secondary | ICD-10-CM | POA: Diagnosis not present

## 2015-08-29 DIAGNOSIS — I350 Nonrheumatic aortic (valve) stenosis: Secondary | ICD-10-CM | POA: Diagnosis not present

## 2015-08-29 DIAGNOSIS — Z85820 Personal history of malignant melanoma of skin: Secondary | ICD-10-CM | POA: Diagnosis not present

## 2015-08-29 DIAGNOSIS — C44712 Basal cell carcinoma of skin of right lower limb, including hip: Secondary | ICD-10-CM | POA: Diagnosis not present

## 2015-08-29 DIAGNOSIS — I360 Nonrheumatic tricuspid (valve) stenosis: Secondary | ICD-10-CM | POA: Diagnosis not present

## 2015-08-29 DIAGNOSIS — I340 Nonrheumatic mitral (valve) insufficiency: Secondary | ICD-10-CM | POA: Diagnosis not present

## 2015-08-29 DIAGNOSIS — S72012D Unspecified intracapsular fracture of left femur, subsequent encounter for closed fracture with routine healing: Secondary | ICD-10-CM | POA: Diagnosis not present

## 2015-08-29 DIAGNOSIS — L570 Actinic keratosis: Secondary | ICD-10-CM | POA: Diagnosis not present

## 2015-08-29 DIAGNOSIS — I272 Other secondary pulmonary hypertension: Secondary | ICD-10-CM | POA: Diagnosis not present

## 2015-08-29 DIAGNOSIS — M797 Fibromyalgia: Secondary | ICD-10-CM | POA: Diagnosis not present

## 2015-08-29 DIAGNOSIS — M6281 Muscle weakness (generalized): Secondary | ICD-10-CM | POA: Diagnosis not present

## 2015-09-02 DIAGNOSIS — E539 Vitamin B deficiency, unspecified: Secondary | ICD-10-CM | POA: Diagnosis not present

## 2015-09-06 DIAGNOSIS — I340 Nonrheumatic mitral (valve) insufficiency: Secondary | ICD-10-CM | POA: Diagnosis not present

## 2015-09-06 DIAGNOSIS — I360 Nonrheumatic tricuspid (valve) stenosis: Secondary | ICD-10-CM | POA: Diagnosis not present

## 2015-09-06 DIAGNOSIS — I272 Other secondary pulmonary hypertension: Secondary | ICD-10-CM | POA: Diagnosis not present

## 2015-09-06 DIAGNOSIS — M797 Fibromyalgia: Secondary | ICD-10-CM | POA: Diagnosis not present

## 2015-09-06 DIAGNOSIS — I350 Nonrheumatic aortic (valve) stenosis: Secondary | ICD-10-CM | POA: Diagnosis not present

## 2015-09-06 DIAGNOSIS — M6281 Muscle weakness (generalized): Secondary | ICD-10-CM | POA: Diagnosis not present

## 2015-09-06 DIAGNOSIS — S72012D Unspecified intracapsular fracture of left femur, subsequent encounter for closed fracture with routine healing: Secondary | ICD-10-CM | POA: Diagnosis not present

## 2015-09-06 DIAGNOSIS — M159 Polyosteoarthritis, unspecified: Secondary | ICD-10-CM | POA: Diagnosis not present

## 2015-09-06 DIAGNOSIS — E039 Hypothyroidism, unspecified: Secondary | ICD-10-CM | POA: Diagnosis not present

## 2015-09-15 DIAGNOSIS — E539 Vitamin B deficiency, unspecified: Secondary | ICD-10-CM | POA: Diagnosis not present

## 2015-09-26 DIAGNOSIS — R3915 Urgency of urination: Secondary | ICD-10-CM | POA: Diagnosis not present

## 2015-09-26 DIAGNOSIS — N3941 Urge incontinence: Secondary | ICD-10-CM | POA: Diagnosis not present

## 2015-09-26 DIAGNOSIS — N8110 Cystocele, unspecified: Secondary | ICD-10-CM | POA: Diagnosis not present

## 2015-09-26 DIAGNOSIS — N905 Atrophy of vulva: Secondary | ICD-10-CM | POA: Diagnosis not present

## 2015-09-27 DIAGNOSIS — Z96642 Presence of left artificial hip joint: Secondary | ICD-10-CM | POA: Diagnosis not present

## 2015-09-27 DIAGNOSIS — S72002A Fracture of unspecified part of neck of left femur, initial encounter for closed fracture: Secondary | ICD-10-CM | POA: Diagnosis not present

## 2015-09-29 DIAGNOSIS — C44712 Basal cell carcinoma of skin of right lower limb, including hip: Secondary | ICD-10-CM | POA: Diagnosis not present

## 2015-09-29 DIAGNOSIS — L570 Actinic keratosis: Secondary | ICD-10-CM | POA: Diagnosis not present

## 2015-09-30 DIAGNOSIS — E539 Vitamin B deficiency, unspecified: Secondary | ICD-10-CM | POA: Diagnosis not present

## 2015-10-04 DIAGNOSIS — M9903 Segmental and somatic dysfunction of lumbar region: Secondary | ICD-10-CM | POA: Diagnosis not present

## 2015-10-04 DIAGNOSIS — M7060 Trochanteric bursitis, unspecified hip: Secondary | ICD-10-CM | POA: Diagnosis not present

## 2015-10-04 DIAGNOSIS — M47816 Spondylosis without myelopathy or radiculopathy, lumbar region: Secondary | ICD-10-CM | POA: Diagnosis not present

## 2015-10-06 DIAGNOSIS — M9903 Segmental and somatic dysfunction of lumbar region: Secondary | ICD-10-CM | POA: Diagnosis not present

## 2015-10-06 DIAGNOSIS — E782 Mixed hyperlipidemia: Secondary | ICD-10-CM | POA: Diagnosis not present

## 2015-10-06 DIAGNOSIS — I10 Essential (primary) hypertension: Secondary | ICD-10-CM | POA: Diagnosis not present

## 2015-10-06 DIAGNOSIS — M47816 Spondylosis without myelopathy or radiculopathy, lumbar region: Secondary | ICD-10-CM | POA: Diagnosis not present

## 2015-10-06 DIAGNOSIS — E039 Hypothyroidism, unspecified: Secondary | ICD-10-CM | POA: Diagnosis not present

## 2015-10-06 DIAGNOSIS — M7060 Trochanteric bursitis, unspecified hip: Secondary | ICD-10-CM | POA: Diagnosis not present

## 2015-10-06 DIAGNOSIS — K210 Gastro-esophageal reflux disease with esophagitis: Secondary | ICD-10-CM | POA: Diagnosis not present

## 2015-10-06 DIAGNOSIS — E539 Vitamin B deficiency, unspecified: Secondary | ICD-10-CM | POA: Diagnosis not present

## 2015-10-10 DIAGNOSIS — E782 Mixed hyperlipidemia: Secondary | ICD-10-CM | POA: Diagnosis not present

## 2015-10-10 DIAGNOSIS — M810 Age-related osteoporosis without current pathological fracture: Secondary | ICD-10-CM | POA: Diagnosis not present

## 2015-10-10 DIAGNOSIS — Z1212 Encounter for screening for malignant neoplasm of rectum: Secondary | ICD-10-CM | POA: Diagnosis not present

## 2015-10-10 DIAGNOSIS — I10 Essential (primary) hypertension: Secondary | ICD-10-CM | POA: Diagnosis not present

## 2015-10-10 DIAGNOSIS — M47816 Spondylosis without myelopathy or radiculopathy, lumbar region: Secondary | ICD-10-CM | POA: Diagnosis not present

## 2015-10-10 DIAGNOSIS — E539 Vitamin B deficiency, unspecified: Secondary | ICD-10-CM | POA: Diagnosis not present

## 2015-10-10 DIAGNOSIS — M7060 Trochanteric bursitis, unspecified hip: Secondary | ICD-10-CM | POA: Diagnosis not present

## 2015-10-10 DIAGNOSIS — D510 Vitamin B12 deficiency anemia due to intrinsic factor deficiency: Secondary | ICD-10-CM | POA: Diagnosis not present

## 2015-10-10 DIAGNOSIS — E039 Hypothyroidism, unspecified: Secondary | ICD-10-CM | POA: Diagnosis not present

## 2015-10-10 DIAGNOSIS — M9903 Segmental and somatic dysfunction of lumbar region: Secondary | ICD-10-CM | POA: Diagnosis not present

## 2015-10-10 DIAGNOSIS — J301 Allergic rhinitis due to pollen: Secondary | ICD-10-CM | POA: Diagnosis not present

## 2015-10-13 ENCOUNTER — Other Ambulatory Visit (HOSPITAL_COMMUNITY): Payer: Self-pay | Admitting: Family Medicine

## 2015-10-13 DIAGNOSIS — Z1231 Encounter for screening mammogram for malignant neoplasm of breast: Secondary | ICD-10-CM

## 2015-10-13 DIAGNOSIS — M7060 Trochanteric bursitis, unspecified hip: Secondary | ICD-10-CM | POA: Diagnosis not present

## 2015-10-13 DIAGNOSIS — M9903 Segmental and somatic dysfunction of lumbar region: Secondary | ICD-10-CM | POA: Diagnosis not present

## 2015-10-13 DIAGNOSIS — M47816 Spondylosis without myelopathy or radiculopathy, lumbar region: Secondary | ICD-10-CM | POA: Diagnosis not present

## 2015-10-17 DIAGNOSIS — D510 Vitamin B12 deficiency anemia due to intrinsic factor deficiency: Secondary | ICD-10-CM | POA: Diagnosis not present

## 2015-10-18 DIAGNOSIS — M47816 Spondylosis without myelopathy or radiculopathy, lumbar region: Secondary | ICD-10-CM | POA: Diagnosis not present

## 2015-10-18 DIAGNOSIS — M9903 Segmental and somatic dysfunction of lumbar region: Secondary | ICD-10-CM | POA: Diagnosis not present

## 2015-10-18 DIAGNOSIS — M7060 Trochanteric bursitis, unspecified hip: Secondary | ICD-10-CM | POA: Diagnosis not present

## 2015-10-24 DIAGNOSIS — E539 Vitamin B deficiency, unspecified: Secondary | ICD-10-CM | POA: Diagnosis not present

## 2015-10-24 DIAGNOSIS — M9903 Segmental and somatic dysfunction of lumbar region: Secondary | ICD-10-CM | POA: Diagnosis not present

## 2015-10-24 DIAGNOSIS — M47816 Spondylosis without myelopathy or radiculopathy, lumbar region: Secondary | ICD-10-CM | POA: Diagnosis not present

## 2015-10-24 DIAGNOSIS — M7060 Trochanteric bursitis, unspecified hip: Secondary | ICD-10-CM | POA: Diagnosis not present

## 2015-10-25 DIAGNOSIS — R3915 Urgency of urination: Secondary | ICD-10-CM | POA: Diagnosis not present

## 2015-10-25 DIAGNOSIS — N3941 Urge incontinence: Secondary | ICD-10-CM | POA: Diagnosis not present

## 2015-10-27 DIAGNOSIS — I517 Cardiomegaly: Secondary | ICD-10-CM | POA: Diagnosis not present

## 2015-10-27 DIAGNOSIS — I080 Rheumatic disorders of both mitral and aortic valves: Secondary | ICD-10-CM | POA: Diagnosis not present

## 2015-10-27 DIAGNOSIS — R931 Abnormal findings on diagnostic imaging of heart and coronary circulation: Secondary | ICD-10-CM | POA: Diagnosis not present

## 2015-10-27 DIAGNOSIS — I340 Nonrheumatic mitral (valve) insufficiency: Secondary | ICD-10-CM | POA: Diagnosis not present

## 2015-10-27 DIAGNOSIS — R55 Syncope and collapse: Secondary | ICD-10-CM | POA: Diagnosis not present

## 2015-10-27 DIAGNOSIS — R42 Dizziness and giddiness: Secondary | ICD-10-CM | POA: Diagnosis not present

## 2015-10-28 DIAGNOSIS — M810 Age-related osteoporosis without current pathological fracture: Secondary | ICD-10-CM | POA: Diagnosis not present

## 2015-11-01 DIAGNOSIS — M47816 Spondylosis without myelopathy or radiculopathy, lumbar region: Secondary | ICD-10-CM | POA: Diagnosis not present

## 2015-11-01 DIAGNOSIS — M9903 Segmental and somatic dysfunction of lumbar region: Secondary | ICD-10-CM | POA: Diagnosis not present

## 2015-11-01 DIAGNOSIS — E539 Vitamin B deficiency, unspecified: Secondary | ICD-10-CM | POA: Diagnosis not present

## 2015-11-01 DIAGNOSIS — M7060 Trochanteric bursitis, unspecified hip: Secondary | ICD-10-CM | POA: Diagnosis not present

## 2015-11-08 DIAGNOSIS — M47816 Spondylosis without myelopathy or radiculopathy, lumbar region: Secondary | ICD-10-CM | POA: Diagnosis not present

## 2015-11-08 DIAGNOSIS — M9903 Segmental and somatic dysfunction of lumbar region: Secondary | ICD-10-CM | POA: Diagnosis not present

## 2015-11-08 DIAGNOSIS — M7060 Trochanteric bursitis, unspecified hip: Secondary | ICD-10-CM | POA: Diagnosis not present

## 2015-11-11 DIAGNOSIS — D510 Vitamin B12 deficiency anemia due to intrinsic factor deficiency: Secondary | ICD-10-CM | POA: Diagnosis not present

## 2015-11-15 DIAGNOSIS — M7060 Trochanteric bursitis, unspecified hip: Secondary | ICD-10-CM | POA: Diagnosis not present

## 2015-11-15 DIAGNOSIS — M9903 Segmental and somatic dysfunction of lumbar region: Secondary | ICD-10-CM | POA: Diagnosis not present

## 2015-11-15 DIAGNOSIS — M47816 Spondylosis without myelopathy or radiculopathy, lumbar region: Secondary | ICD-10-CM | POA: Diagnosis not present

## 2015-11-22 DIAGNOSIS — M9903 Segmental and somatic dysfunction of lumbar region: Secondary | ICD-10-CM | POA: Diagnosis not present

## 2015-11-22 DIAGNOSIS — M47816 Spondylosis without myelopathy or radiculopathy, lumbar region: Secondary | ICD-10-CM | POA: Diagnosis not present

## 2015-11-22 DIAGNOSIS — M7060 Trochanteric bursitis, unspecified hip: Secondary | ICD-10-CM | POA: Diagnosis not present

## 2015-11-25 DIAGNOSIS — D519 Vitamin B12 deficiency anemia, unspecified: Secondary | ICD-10-CM | POA: Diagnosis not present

## 2015-12-07 DIAGNOSIS — M7060 Trochanteric bursitis, unspecified hip: Secondary | ICD-10-CM | POA: Diagnosis not present

## 2015-12-07 DIAGNOSIS — M47816 Spondylosis without myelopathy or radiculopathy, lumbar region: Secondary | ICD-10-CM | POA: Diagnosis not present

## 2015-12-07 DIAGNOSIS — M9903 Segmental and somatic dysfunction of lumbar region: Secondary | ICD-10-CM | POA: Diagnosis not present

## 2015-12-09 ENCOUNTER — Ambulatory Visit (HOSPITAL_COMMUNITY): Payer: Commercial Managed Care - HMO

## 2015-12-09 DIAGNOSIS — E539 Vitamin B deficiency, unspecified: Secondary | ICD-10-CM | POA: Diagnosis not present

## 2015-12-16 ENCOUNTER — Ambulatory Visit (HOSPITAL_COMMUNITY): Payer: Commercial Managed Care - HMO

## 2015-12-19 DIAGNOSIS — R3915 Urgency of urination: Secondary | ICD-10-CM | POA: Diagnosis not present

## 2015-12-19 DIAGNOSIS — N390 Urinary tract infection, site not specified: Secondary | ICD-10-CM | POA: Diagnosis not present

## 2015-12-19 DIAGNOSIS — Z1231 Encounter for screening mammogram for malignant neoplasm of breast: Secondary | ICD-10-CM | POA: Diagnosis not present

## 2015-12-20 DIAGNOSIS — M7060 Trochanteric bursitis, unspecified hip: Secondary | ICD-10-CM | POA: Diagnosis not present

## 2015-12-20 DIAGNOSIS — M47816 Spondylosis without myelopathy or radiculopathy, lumbar region: Secondary | ICD-10-CM | POA: Diagnosis not present

## 2015-12-20 DIAGNOSIS — M9903 Segmental and somatic dysfunction of lumbar region: Secondary | ICD-10-CM | POA: Diagnosis not present

## 2015-12-22 ENCOUNTER — Ambulatory Visit (HOSPITAL_COMMUNITY): Payer: Commercial Managed Care - HMO

## 2015-12-23 DIAGNOSIS — E539 Vitamin B deficiency, unspecified: Secondary | ICD-10-CM | POA: Diagnosis not present

## 2016-01-04 DIAGNOSIS — M9903 Segmental and somatic dysfunction of lumbar region: Secondary | ICD-10-CM | POA: Diagnosis not present

## 2016-01-04 DIAGNOSIS — M7060 Trochanteric bursitis, unspecified hip: Secondary | ICD-10-CM | POA: Diagnosis not present

## 2016-01-04 DIAGNOSIS — M47816 Spondylosis without myelopathy or radiculopathy, lumbar region: Secondary | ICD-10-CM | POA: Diagnosis not present

## 2016-01-06 DIAGNOSIS — E539 Vitamin B deficiency, unspecified: Secondary | ICD-10-CM | POA: Diagnosis not present

## 2016-01-09 DIAGNOSIS — E782 Mixed hyperlipidemia: Secondary | ICD-10-CM | POA: Diagnosis not present

## 2016-01-09 DIAGNOSIS — Z6827 Body mass index (BMI) 27.0-27.9, adult: Secondary | ICD-10-CM | POA: Diagnosis not present

## 2016-01-09 DIAGNOSIS — E039 Hypothyroidism, unspecified: Secondary | ICD-10-CM | POA: Diagnosis not present

## 2016-01-09 DIAGNOSIS — D510 Vitamin B12 deficiency anemia due to intrinsic factor deficiency: Secondary | ICD-10-CM | POA: Diagnosis not present

## 2016-01-09 DIAGNOSIS — M545 Low back pain: Secondary | ICD-10-CM | POA: Diagnosis not present

## 2016-01-09 DIAGNOSIS — J301 Allergic rhinitis due to pollen: Secondary | ICD-10-CM | POA: Diagnosis not present

## 2016-01-09 DIAGNOSIS — E539 Vitamin B deficiency, unspecified: Secondary | ICD-10-CM | POA: Diagnosis not present

## 2016-01-09 DIAGNOSIS — I10 Essential (primary) hypertension: Secondary | ICD-10-CM | POA: Diagnosis not present

## 2016-01-19 DIAGNOSIS — M7060 Trochanteric bursitis, unspecified hip: Secondary | ICD-10-CM | POA: Diagnosis not present

## 2016-01-19 DIAGNOSIS — M9903 Segmental and somatic dysfunction of lumbar region: Secondary | ICD-10-CM | POA: Diagnosis not present

## 2016-01-19 DIAGNOSIS — M47816 Spondylosis without myelopathy or radiculopathy, lumbar region: Secondary | ICD-10-CM | POA: Diagnosis not present

## 2016-01-20 DIAGNOSIS — E539 Vitamin B deficiency, unspecified: Secondary | ICD-10-CM | POA: Diagnosis not present

## 2016-01-23 DIAGNOSIS — Z6827 Body mass index (BMI) 27.0-27.9, adult: Secondary | ICD-10-CM | POA: Diagnosis not present

## 2016-01-23 DIAGNOSIS — L219 Seborrheic dermatitis, unspecified: Secondary | ICD-10-CM | POA: Diagnosis not present

## 2016-01-27 DIAGNOSIS — M1612 Unilateral primary osteoarthritis, left hip: Secondary | ICD-10-CM | POA: Diagnosis not present

## 2016-01-27 DIAGNOSIS — M7062 Trochanteric bursitis, left hip: Secondary | ICD-10-CM | POA: Diagnosis not present

## 2016-01-27 DIAGNOSIS — Z6826 Body mass index (BMI) 26.0-26.9, adult: Secondary | ICD-10-CM | POA: Diagnosis not present

## 2016-01-31 DIAGNOSIS — H26493 Other secondary cataract, bilateral: Secondary | ICD-10-CM | POA: Diagnosis not present

## 2016-01-31 DIAGNOSIS — H538 Other visual disturbances: Secondary | ICD-10-CM | POA: Diagnosis not present

## 2016-02-02 DIAGNOSIS — S72002A Fracture of unspecified part of neck of left femur, initial encounter for closed fracture: Secondary | ICD-10-CM | POA: Diagnosis not present

## 2016-02-03 DIAGNOSIS — E539 Vitamin B deficiency, unspecified: Secondary | ICD-10-CM | POA: Diagnosis not present

## 2016-02-16 ENCOUNTER — Other Ambulatory Visit: Payer: Self-pay | Admitting: *Deleted

## 2016-02-16 NOTE — Patient Outreach (Signed)
[place] [facility]) Care Management  02/16/2016  CAMILLE ANTAR May 18, 1931 [id number]  Referral via [name] -Tier 4 List:  Telephone call to patient; left HIPPA compliant voice mail requesting call back.  Plan: Follow up within 3 business days.   Sherrin Daisy, RN BSN [place] Management Coordinator [place] [facility] Care Management  [phone]

## 2016-02-17 DIAGNOSIS — D510 Vitamin B12 deficiency anemia due to intrinsic factor deficiency: Secondary | ICD-10-CM | POA: Diagnosis not present

## 2016-02-20 ENCOUNTER — Encounter (HOSPITAL_COMMUNITY)
Admission: RE | Disposition: A | Discharge status: Home / Self Care | Payer: Self-pay | Source: Ambulatory Visit | Attending: Ophthalmology

## 2016-02-20 ENCOUNTER — Other Ambulatory Visit: Payer: Self-pay | Admitting: *Deleted

## 2016-02-20 ENCOUNTER — Ambulatory Visit (HOSPITAL_COMMUNITY)
Admission: RE | Admit: 2016-02-20 | Discharge: 2016-02-20 | Disposition: A | Discharge status: Home / Self Care | Payer: Commercial Managed Care - HMO | Source: Ambulatory Visit | Attending: Ophthalmology | Admitting: Ophthalmology

## 2016-02-20 ENCOUNTER — Encounter (HOSPITAL_COMMUNITY): Payer: Self-pay

## 2016-02-20 DIAGNOSIS — H26493 Other secondary cataract, bilateral: Secondary | ICD-10-CM | POA: Diagnosis not present

## 2016-02-20 DIAGNOSIS — Z79899 Other long term (current) drug therapy: Secondary | ICD-10-CM | POA: Insufficient documentation

## 2016-02-20 DIAGNOSIS — H538 Other visual disturbances: Secondary | ICD-10-CM | POA: Diagnosis not present

## 2016-02-20 DIAGNOSIS — H2640 Unspecified secondary cataract: Secondary | ICD-10-CM | POA: Diagnosis present

## 2016-02-20 DIAGNOSIS — K219 Gastro-esophageal reflux disease without esophagitis: Secondary | ICD-10-CM | POA: Diagnosis not present

## 2016-02-20 HISTORY — PX: YAG LASER APPLICATION: SHX6189

## 2016-02-20 SURGERY — TREATMENT, USING YAG LASER
Anesthesia: LOCAL | Laterality: Bilateral

## 2016-02-20 MED ORDER — TETRACAINE HCL 0.5 % OP SOLN: 1.0000 [drp] | Freq: Once | OPHTHALMIC | Status: DC

## 2016-02-20 MED ORDER — TETRACAINE HCL 0.5 % OP SOLN
OPHTHALMIC | Status: AC
  Filled 2016-02-20: qty 4

## 2016-02-20 MED ORDER — TROPICAMIDE 1 % OP SOLN
1.0000 [drp] | OPHTHALMIC | Status: AC
  Administered 2016-02-20 (×3): 1 [drp] via OPHTHALMIC

## 2016-02-20 MED ORDER — TETRACAINE HCL 0.5 % OP SOLN
1.0000 [drp] | Freq: Once | OPHTHALMIC | Status: AC
  Administered 2016-02-20: 1 [drp] via OPHTHALMIC

## 2016-02-20 MED ORDER — TROPICAMIDE 1 % OP SOLN
OPHTHALMIC | Status: AC
  Filled 2016-02-20: qty 3

## 2016-02-20 NOTE — Discharge Instructions (Signed)
Sabrina Morrison  02/20/2016     Instructions    Activity: No Restrictions.   Diet: Resume Diet you were on at home.   Pain Medication: Tylenol if Needed.   CONTACT YOUR DOCTOR IF YOU HAVE PAIN, REDNESS IN YOUR EYE, OR DECREASED VISION.   Follow-up:in 3 weeks with Williams Che, MD.   Dr. Gershon Crane: [phone]  Dr. Iona HansenQN:[id number]  Dr. Geoffry ParadiseBB:[id number]   If you find that you cannot contact your physician, but feel that your signs and   Symptoms warrant a physician's attention, call the Emergency Room at   [phone] ext.[phone].   Othern/a.  FOLLOW UP APPOINTMENT 03/13/2016 @ 11:30 AM

## 2016-02-20 NOTE — Brief Op Note (Signed)
Sabrina Morrison 02/20/2016  Williams Che, MD  Pre-op Diagnosis:  bilateral secondary cataract  Post-op Diagnosis: same  Yag laser self-test completed: Yes.    Indications:  See scanned office H&P for details  Procedure:   YAG posterior capsulotomy OU   Eye protection worn by staff:  Yes.   Laser In Use sign on door:  Yes.    Laser:  {LUMENIS YAG/SLT LASER Power Setting:  1.7 mJ/burst Anatomical site treated:  Posterior capsule OU Number of applications:  OD 44;  OS 24 Total energy delivered: OD 71.26  mJ  OS  46.0  mJ Results:  Clear visual axis OU  The patient was discharged home with instructions to continue all her current glaucoma medications in the un-operated eye, and discontinue all her current glaucoma medications, if any.  Patient verbalizes understanding of discharge instructions:  Yes.    Notes:   Heavy Elschnig's Pearls OD>OS

## 2016-02-20 NOTE — H&P (Signed)
I have reviewed the pre printed H&P, the patient was re-examined, and I have identified no significant interval changes in the patient's medical condition.  There is no change in the plan of care since the history and physical of record. 

## 2016-02-20 NOTE — Patient Outreach (Signed)
[facility]) Care Management  02/20/2016  KRISTIONA SAVELL 1930-11-09 [id number]   Per chart review patient is having hospital procedure today 02/20/2016.   Plan;  Will follow.  Sherrin Daisy, RN BSN [place] Management Coordinator [facility] Care Management  [phone]

## 2016-02-21 ENCOUNTER — Other Ambulatory Visit: Payer: Self-pay | Admitting: *Deleted

## 2016-02-21 NOTE — Patient Outreach (Signed)
[place] [facility]) Care Management  02/21/2016  Sabrina Morrison [birth year] [id number]  Telephone call to patient; HIPPA compliant voice mail left requesting call back.  Plan: Will follow up.  Sherrin Daisy, RN BSN [place] Management Coordinator [facility] Care Management  [phone]

## 2016-02-23 ENCOUNTER — Encounter: Payer: Self-pay | Admitting: *Deleted

## 2016-02-23 ENCOUNTER — Other Ambulatory Visit: Payer: Self-pay | Admitting: *Deleted

## 2016-02-23 NOTE — Patient Outreach (Signed)
[facility]) Care Management  02/23/2016  Sabrina Morrison Jul 02, 1930 [id number]  Telephone call to patient who was advised of reason for call & [facility] care management services.  HIPPA verification received from patient  Patient voices that she has no healthcare concerns currently & no need for [facility] case management services at this time.   States she recently had cataract surgery without complications. States she lives alone and is independent with her care. States she has friend to call upon if there is a need. Drives self to MD appointments and does attend her MD appointments.    Voices that she uses mail order pharmacy for long term medications & local pharmacy for short term prescriptions. States she is consistent with taking medications as ordered by her doctor. Voices understanding of taking medications as prescribed. States she manages own medications and has no problem getting prescriptions filled.   States she has asthma which is controlled. Knows asthma action plan & will utilize 911 if needed.  Takes flu vaccine yearly and will get one in 2 weeks.    Falls prevention discussed with patient & she voices understanding (hx of with left hip fracture & surgical repair in January 2017).  Patient states she is in the process of getting paper work  in order for Advanced care planning. States she does not have healthcare power of attorney in place yet but working on it. Patient states she does not need any further counseling but does not object on receiving Advance Care Planning packet.  Patient has declined [facility] care management services but will accept [facility] contact information via mail.   Plan: Close case. Send [facility] educational material in mail. Send MD closure letter.   Sherrin Daisy, RN BSN [place] Management Coordinator [facility] Care Management  [phone]

## 2016-02-28 ENCOUNTER — Encounter (HOSPITAL_COMMUNITY): Payer: Self-pay | Admitting: Ophthalmology

## 2016-03-02 DIAGNOSIS — E539 Vitamin B deficiency, unspecified: Secondary | ICD-10-CM | POA: Diagnosis not present

## 2016-03-02 DIAGNOSIS — Z23 Encounter for immunization: Secondary | ICD-10-CM | POA: Diagnosis not present

## 2016-03-05 DIAGNOSIS — R3915 Urgency of urination: Secondary | ICD-10-CM | POA: Diagnosis not present

## 2016-03-05 DIAGNOSIS — N3941 Urge incontinence: Secondary | ICD-10-CM | POA: Diagnosis not present

## 2016-03-06 DIAGNOSIS — R3915 Urgency of urination: Secondary | ICD-10-CM | POA: Diagnosis not present

## 2016-03-16 DIAGNOSIS — D510 Vitamin B12 deficiency anemia due to intrinsic factor deficiency: Secondary | ICD-10-CM | POA: Diagnosis not present

## 2016-03-27 DIAGNOSIS — R1011 Right upper quadrant pain: Secondary | ICD-10-CM | POA: Diagnosis not present

## 2016-03-27 DIAGNOSIS — M797 Fibromyalgia: Secondary | ICD-10-CM | POA: Diagnosis not present

## 2016-03-27 DIAGNOSIS — K219 Gastro-esophageal reflux disease without esophagitis: Secondary | ICD-10-CM | POA: Diagnosis not present

## 2016-03-27 DIAGNOSIS — Z6828 Body mass index (BMI) 28.0-28.9, adult: Secondary | ICD-10-CM | POA: Diagnosis not present

## 2016-03-30 DIAGNOSIS — E539 Vitamin B deficiency, unspecified: Secondary | ICD-10-CM | POA: Diagnosis not present

## 2016-04-12 DIAGNOSIS — E539 Vitamin B deficiency, unspecified: Secondary | ICD-10-CM | POA: Diagnosis not present

## 2016-04-17 DIAGNOSIS — E782 Mixed hyperlipidemia: Secondary | ICD-10-CM | POA: Diagnosis not present

## 2016-04-17 DIAGNOSIS — K210 Gastro-esophageal reflux disease with esophagitis: Secondary | ICD-10-CM | POA: Diagnosis not present

## 2016-04-17 DIAGNOSIS — I270 Primary pulmonary hypertension: Secondary | ICD-10-CM | POA: Diagnosis not present

## 2016-04-17 DIAGNOSIS — R5383 Other fatigue: Secondary | ICD-10-CM | POA: Diagnosis not present

## 2016-04-17 DIAGNOSIS — D649 Anemia, unspecified: Secondary | ICD-10-CM | POA: Diagnosis not present

## 2016-04-17 DIAGNOSIS — I10 Essential (primary) hypertension: Secondary | ICD-10-CM | POA: Diagnosis not present

## 2016-04-17 DIAGNOSIS — E539 Vitamin B deficiency, unspecified: Secondary | ICD-10-CM | POA: Diagnosis not present

## 2016-04-17 DIAGNOSIS — E039 Hypothyroidism, unspecified: Secondary | ICD-10-CM | POA: Diagnosis not present

## 2016-04-19 DIAGNOSIS — E539 Vitamin B deficiency, unspecified: Secondary | ICD-10-CM | POA: Diagnosis not present

## 2016-04-19 DIAGNOSIS — Z6828 Body mass index (BMI) 28.0-28.9, adult: Secondary | ICD-10-CM | POA: Diagnosis not present

## 2016-04-19 DIAGNOSIS — I10 Essential (primary) hypertension: Secondary | ICD-10-CM | POA: Diagnosis not present

## 2016-04-19 DIAGNOSIS — E039 Hypothyroidism, unspecified: Secondary | ICD-10-CM | POA: Diagnosis not present

## 2016-04-19 DIAGNOSIS — D510 Vitamin B12 deficiency anemia due to intrinsic factor deficiency: Secondary | ICD-10-CM | POA: Diagnosis not present

## 2016-04-19 DIAGNOSIS — E782 Mixed hyperlipidemia: Secondary | ICD-10-CM | POA: Diagnosis not present

## 2016-04-19 DIAGNOSIS — J301 Allergic rhinitis due to pollen: Secondary | ICD-10-CM | POA: Diagnosis not present

## 2016-04-19 DIAGNOSIS — K219 Gastro-esophageal reflux disease without esophagitis: Secondary | ICD-10-CM | POA: Diagnosis not present

## 2016-04-24 DIAGNOSIS — L570 Actinic keratosis: Secondary | ICD-10-CM | POA: Diagnosis not present

## 2016-04-24 DIAGNOSIS — Z85828 Personal history of other malignant neoplasm of skin: Secondary | ICD-10-CM | POA: Diagnosis not present

## 2016-04-27 DIAGNOSIS — E539 Vitamin B deficiency, unspecified: Secondary | ICD-10-CM | POA: Diagnosis not present

## 2016-05-11 DIAGNOSIS — E539 Vitamin B deficiency, unspecified: Secondary | ICD-10-CM | POA: Diagnosis not present

## 2016-05-25 DIAGNOSIS — E539 Vitamin B deficiency, unspecified: Secondary | ICD-10-CM | POA: Diagnosis not present

## 2016-06-05 DIAGNOSIS — M7062 Trochanteric bursitis, left hip: Secondary | ICD-10-CM | POA: Diagnosis not present

## 2016-06-05 DIAGNOSIS — M25552 Pain in left hip: Secondary | ICD-10-CM | POA: Diagnosis not present

## 2016-06-06 DIAGNOSIS — I10 Essential (primary) hypertension: Secondary | ICD-10-CM | POA: Diagnosis not present

## 2016-06-06 DIAGNOSIS — E539 Vitamin B deficiency, unspecified: Secondary | ICD-10-CM | POA: Diagnosis not present

## 2016-06-06 DIAGNOSIS — J301 Allergic rhinitis due to pollen: Secondary | ICD-10-CM | POA: Diagnosis not present

## 2016-06-06 DIAGNOSIS — K580 Irritable bowel syndrome with diarrhea: Secondary | ICD-10-CM | POA: Diagnosis not present

## 2016-06-06 DIAGNOSIS — Z6827 Body mass index (BMI) 27.0-27.9, adult: Secondary | ICD-10-CM | POA: Diagnosis not present

## 2016-06-06 DIAGNOSIS — I270 Primary pulmonary hypertension: Secondary | ICD-10-CM | POA: Diagnosis not present

## 2016-06-06 DIAGNOSIS — K219 Gastro-esophageal reflux disease without esophagitis: Secondary | ICD-10-CM | POA: Diagnosis not present

## 2016-06-27 DIAGNOSIS — Z6827 Body mass index (BMI) 27.0-27.9, adult: Secondary | ICD-10-CM | POA: Diagnosis not present

## 2016-06-27 DIAGNOSIS — A084 Viral intestinal infection, unspecified: Secondary | ICD-10-CM | POA: Diagnosis not present

## 2016-06-27 DIAGNOSIS — E539 Vitamin B deficiency, unspecified: Secondary | ICD-10-CM | POA: Diagnosis not present

## 2016-07-03 DIAGNOSIS — Z6827 Body mass index (BMI) 27.0-27.9, adult: Secondary | ICD-10-CM | POA: Diagnosis not present

## 2016-07-03 DIAGNOSIS — R300 Dysuria: Secondary | ICD-10-CM | POA: Diagnosis not present

## 2016-07-03 DIAGNOSIS — N390 Urinary tract infection, site not specified: Secondary | ICD-10-CM | POA: Diagnosis not present

## 2016-07-11 DIAGNOSIS — D519 Vitamin B12 deficiency anemia, unspecified: Secondary | ICD-10-CM | POA: Diagnosis not present

## 2016-07-24 DIAGNOSIS — D649 Anemia, unspecified: Secondary | ICD-10-CM | POA: Diagnosis not present

## 2016-07-24 DIAGNOSIS — K210 Gastro-esophageal reflux disease with esophagitis: Secondary | ICD-10-CM | POA: Diagnosis not present

## 2016-07-24 DIAGNOSIS — E782 Mixed hyperlipidemia: Secondary | ICD-10-CM | POA: Diagnosis not present

## 2016-07-24 DIAGNOSIS — Z9189 Other specified personal risk factors, not elsewhere classified: Secondary | ICD-10-CM | POA: Diagnosis not present

## 2016-07-24 DIAGNOSIS — I358 Other nonrheumatic aortic valve disorders: Secondary | ICD-10-CM | POA: Diagnosis not present

## 2016-07-24 DIAGNOSIS — E039 Hypothyroidism, unspecified: Secondary | ICD-10-CM | POA: Diagnosis not present

## 2016-07-24 DIAGNOSIS — I10 Essential (primary) hypertension: Secondary | ICD-10-CM | POA: Diagnosis not present

## 2016-07-24 DIAGNOSIS — E539 Vitamin B deficiency, unspecified: Secondary | ICD-10-CM | POA: Diagnosis not present

## 2016-07-26 DIAGNOSIS — K219 Gastro-esophageal reflux disease without esophagitis: Secondary | ICD-10-CM | POA: Diagnosis not present

## 2016-07-26 DIAGNOSIS — I270 Primary pulmonary hypertension: Secondary | ICD-10-CM | POA: Diagnosis not present

## 2016-07-26 DIAGNOSIS — I10 Essential (primary) hypertension: Secondary | ICD-10-CM | POA: Diagnosis not present

## 2016-07-26 DIAGNOSIS — E539 Vitamin B deficiency, unspecified: Secondary | ICD-10-CM | POA: Diagnosis not present

## 2016-07-26 DIAGNOSIS — K580 Irritable bowel syndrome with diarrhea: Secondary | ICD-10-CM | POA: Diagnosis not present

## 2016-07-26 DIAGNOSIS — M797 Fibromyalgia: Secondary | ICD-10-CM | POA: Diagnosis not present

## 2016-07-26 DIAGNOSIS — Z0001 Encounter for general adult medical examination with abnormal findings: Secondary | ICD-10-CM | POA: Diagnosis not present

## 2016-07-26 DIAGNOSIS — J301 Allergic rhinitis due to pollen: Secondary | ICD-10-CM | POA: Diagnosis not present

## 2016-08-09 DIAGNOSIS — E539 Vitamin B deficiency, unspecified: Secondary | ICD-10-CM | POA: Diagnosis not present

## 2016-08-24 DIAGNOSIS — E539 Vitamin B deficiency, unspecified: Secondary | ICD-10-CM | POA: Diagnosis not present

## 2016-09-04 ENCOUNTER — Other Ambulatory Visit: Payer: Self-pay

## 2016-09-04 NOTE — Patient Outreach (Signed)
[facility]) Care Management  09/04/2016  LEXI CONATY 1930/09/14 [id number]     Telephone Screen  Referral Date: 09/04/16 Referral Source: Nurse Call Center Report Referral Reason: " caller states she is on Vesicare and Myrbetriq and her face is swollen everyday when she wakes up and is gone by dinner. Has been happening for a year since she's been on the medicine. Lots of itching and sweating throughout the night."    Outreach attempt #1 to patient. No answer at present. RN CM left HIPAA compliant voicemail message along with contact info.      Plan: RN CM will make outreach attempt to patient within one business day if no return call from patient.   Enzo Montgomery, RN,BSN,CCM [place] Management Telephonic Care Management Coordinator Direct Phone: [phone] Toll Free: [phone] Fax: [phone]

## 2016-09-05 ENCOUNTER — Other Ambulatory Visit: Payer: Self-pay

## 2016-09-05 NOTE — Patient Outreach (Signed)
[facility]) Care Management  09/05/2016  MYRACLE FEBRES 1931/05/27 [id number]   Telephone Screen  Referral Date: 09/04/16 Referral Source: Nurse Call Center Report Referral Reason: " caller states she is on Vesicare and Myrbetriq and her face is swollen everyday when she wakes up and is gone by dinner. Has been happening for a year since she's been on the medicine. Lots of itching and sweating throughout the night."   Outreach attempt #2 to patient. Spoke with patient. She states she is doing fairly well. patient reports that she has not had time to contact MD office as advised by nurse call line. She voices that she continues to have those symptoms but nothing worse than normal. patient reports that she read up on symptoms and is convinced that she is experiencing side effects from [place]. RN CM strongly encouraged patient to contact MD office ASAP to discuss this with them. She voiced that she would call today. Patient unsure of when her next PCP and will verify appt during call to MD office as well She reports that Dr. Harlow Mares was her urologist that prescribed med but he has since closed his office. She was given some urologist names in [place] to establish care with but patient states she would prefer to see someone local. She has been seeing PCP to handle urology issues. Patient states that a church member told her that a new urologist was opening a practice in [place] and she plans to check them out. She denies any issues with meds and/or transportation. She voices no further RN CM needs or concerns at this time. Patient appreciative of f/u call.   Plan: RN CM will notify [facility] administrative assistant of case status.   Enzo Montgomery, RN,BSN,CCM [place] [facility] Telephonic Care Management Coordinator Direct Phone: [phone] Toll Free: [phone] Fax: [phone]

## 2016-09-07 DIAGNOSIS — E539 Vitamin B deficiency, unspecified: Secondary | ICD-10-CM | POA: Diagnosis not present

## 2016-09-12 DIAGNOSIS — Z6828 Body mass index (BMI) 28.0-28.9, adult: Secondary | ICD-10-CM | POA: Diagnosis not present

## 2016-09-12 DIAGNOSIS — R21 Rash and other nonspecific skin eruption: Secondary | ICD-10-CM | POA: Diagnosis not present

## 2016-09-12 DIAGNOSIS — N3946 Mixed incontinence: Secondary | ICD-10-CM | POA: Diagnosis not present

## 2016-09-17 DIAGNOSIS — I10 Essential (primary) hypertension: Secondary | ICD-10-CM | POA: Diagnosis not present

## 2016-09-17 DIAGNOSIS — M7062 Trochanteric bursitis, left hip: Secondary | ICD-10-CM | POA: Diagnosis not present

## 2016-09-17 DIAGNOSIS — Z6827 Body mass index (BMI) 27.0-27.9, adult: Secondary | ICD-10-CM | POA: Diagnosis not present

## 2016-09-21 DIAGNOSIS — E539 Vitamin B deficiency, unspecified: Secondary | ICD-10-CM | POA: Diagnosis not present

## 2016-10-05 DIAGNOSIS — E539 Vitamin B deficiency, unspecified: Secondary | ICD-10-CM | POA: Diagnosis not present

## 2016-10-05 DIAGNOSIS — Z6827 Body mass index (BMI) 27.0-27.9, adult: Secondary | ICD-10-CM | POA: Diagnosis not present

## 2016-10-05 DIAGNOSIS — J4541 Moderate persistent asthma with (acute) exacerbation: Secondary | ICD-10-CM | POA: Diagnosis not present

## 2016-10-05 DIAGNOSIS — Z23 Encounter for immunization: Secondary | ICD-10-CM | POA: Diagnosis not present

## 2016-10-18 DIAGNOSIS — J309 Allergic rhinitis, unspecified: Secondary | ICD-10-CM | POA: Diagnosis not present

## 2016-10-18 DIAGNOSIS — D510 Vitamin B12 deficiency anemia due to intrinsic factor deficiency: Secondary | ICD-10-CM | POA: Diagnosis not present

## 2016-10-18 DIAGNOSIS — Z6828 Body mass index (BMI) 28.0-28.9, adult: Secondary | ICD-10-CM | POA: Diagnosis not present

## 2016-10-26 DIAGNOSIS — Z9189 Other specified personal risk factors, not elsewhere classified: Secondary | ICD-10-CM | POA: Diagnosis not present

## 2016-10-26 DIAGNOSIS — I358 Other nonrheumatic aortic valve disorders: Secondary | ICD-10-CM | POA: Diagnosis not present

## 2016-10-26 DIAGNOSIS — I10 Essential (primary) hypertension: Secondary | ICD-10-CM | POA: Diagnosis not present

## 2016-10-26 DIAGNOSIS — K210 Gastro-esophageal reflux disease with esophagitis: Secondary | ICD-10-CM | POA: Diagnosis not present

## 2016-10-26 DIAGNOSIS — E039 Hypothyroidism, unspecified: Secondary | ICD-10-CM | POA: Diagnosis not present

## 2016-10-31 DIAGNOSIS — I10 Essential (primary) hypertension: Secondary | ICD-10-CM | POA: Diagnosis not present

## 2016-10-31 DIAGNOSIS — K580 Irritable bowel syndrome with diarrhea: Secondary | ICD-10-CM | POA: Diagnosis not present

## 2016-10-31 DIAGNOSIS — M797 Fibromyalgia: Secondary | ICD-10-CM | POA: Diagnosis not present

## 2016-10-31 DIAGNOSIS — E539 Vitamin B deficiency, unspecified: Secondary | ICD-10-CM | POA: Diagnosis not present

## 2016-10-31 DIAGNOSIS — D692 Other nonthrombocytopenic purpura: Secondary | ICD-10-CM | POA: Diagnosis not present

## 2016-10-31 DIAGNOSIS — Z6826 Body mass index (BMI) 26.0-26.9, adult: Secondary | ICD-10-CM | POA: Diagnosis not present

## 2016-10-31 DIAGNOSIS — K219 Gastro-esophageal reflux disease without esophagitis: Secondary | ICD-10-CM | POA: Diagnosis not present

## 2016-10-31 DIAGNOSIS — I270 Primary pulmonary hypertension: Secondary | ICD-10-CM | POA: Diagnosis not present

## 2016-11-08 DIAGNOSIS — M25531 Pain in right wrist: Secondary | ICD-10-CM | POA: Diagnosis not present

## 2016-11-08 DIAGNOSIS — M25511 Pain in right shoulder: Secondary | ICD-10-CM | POA: Diagnosis not present

## 2016-11-08 DIAGNOSIS — D510 Vitamin B12 deficiency anemia due to intrinsic factor deficiency: Secondary | ICD-10-CM | POA: Diagnosis not present

## 2016-11-08 DIAGNOSIS — Z6827 Body mass index (BMI) 27.0-27.9, adult: Secondary | ICD-10-CM | POA: Diagnosis not present

## 2016-11-14 DIAGNOSIS — M797 Fibromyalgia: Secondary | ICD-10-CM | POA: Diagnosis not present

## 2016-11-14 DIAGNOSIS — Z6827 Body mass index (BMI) 27.0-27.9, adult: Secondary | ICD-10-CM | POA: Diagnosis not present

## 2016-11-14 DIAGNOSIS — M25531 Pain in right wrist: Secondary | ICD-10-CM | POA: Diagnosis not present

## 2016-11-14 DIAGNOSIS — M25511 Pain in right shoulder: Secondary | ICD-10-CM | POA: Diagnosis not present

## 2016-11-30 DIAGNOSIS — M797 Fibromyalgia: Secondary | ICD-10-CM | POA: Diagnosis not present

## 2016-11-30 DIAGNOSIS — G4700 Insomnia, unspecified: Secondary | ICD-10-CM | POA: Diagnosis not present

## 2016-11-30 DIAGNOSIS — Z6827 Body mass index (BMI) 27.0-27.9, adult: Secondary | ICD-10-CM | POA: Diagnosis not present

## 2016-11-30 DIAGNOSIS — D510 Vitamin B12 deficiency anemia due to intrinsic factor deficiency: Secondary | ICD-10-CM | POA: Diagnosis not present

## 2016-12-14 DIAGNOSIS — G4700 Insomnia, unspecified: Secondary | ICD-10-CM | POA: Diagnosis not present

## 2016-12-14 DIAGNOSIS — M797 Fibromyalgia: Secondary | ICD-10-CM | POA: Diagnosis not present

## 2016-12-14 DIAGNOSIS — D510 Vitamin B12 deficiency anemia due to intrinsic factor deficiency: Secondary | ICD-10-CM | POA: Diagnosis not present

## 2016-12-14 DIAGNOSIS — Z6827 Body mass index (BMI) 27.0-27.9, adult: Secondary | ICD-10-CM | POA: Diagnosis not present

## 2016-12-14 DIAGNOSIS — Z23 Encounter for immunization: Secondary | ICD-10-CM | POA: Diagnosis not present

## 2016-12-14 DIAGNOSIS — J309 Allergic rhinitis, unspecified: Secondary | ICD-10-CM | POA: Diagnosis not present

## 2016-12-17 DIAGNOSIS — R69 Illness, unspecified: Secondary | ICD-10-CM | POA: Diagnosis not present

## 2016-12-28 DIAGNOSIS — E539 Vitamin B deficiency, unspecified: Secondary | ICD-10-CM | POA: Diagnosis not present

## 2017-01-11 DIAGNOSIS — G4700 Insomnia, unspecified: Secondary | ICD-10-CM | POA: Diagnosis not present

## 2017-01-11 DIAGNOSIS — Z6827 Body mass index (BMI) 27.0-27.9, adult: Secondary | ICD-10-CM | POA: Diagnosis not present

## 2017-01-11 DIAGNOSIS — R110 Nausea: Secondary | ICD-10-CM | POA: Diagnosis not present

## 2017-01-11 DIAGNOSIS — D510 Vitamin B12 deficiency anemia due to intrinsic factor deficiency: Secondary | ICD-10-CM | POA: Diagnosis not present

## 2017-01-24 DIAGNOSIS — N390 Urinary tract infection, site not specified: Secondary | ICD-10-CM | POA: Diagnosis not present

## 2017-01-24 DIAGNOSIS — E539 Vitamin B deficiency, unspecified: Secondary | ICD-10-CM | POA: Diagnosis not present

## 2017-01-24 DIAGNOSIS — Z6827 Body mass index (BMI) 27.0-27.9, adult: Secondary | ICD-10-CM | POA: Diagnosis not present

## 2017-01-28 DIAGNOSIS — R1011 Right upper quadrant pain: Secondary | ICD-10-CM | POA: Diagnosis not present

## 2017-01-28 DIAGNOSIS — Z9189 Other specified personal risk factors, not elsewhere classified: Secondary | ICD-10-CM | POA: Diagnosis not present

## 2017-01-28 DIAGNOSIS — K580 Irritable bowel syndrome with diarrhea: Secondary | ICD-10-CM | POA: Diagnosis not present

## 2017-01-28 DIAGNOSIS — K210 Gastro-esophageal reflux disease with esophagitis: Secondary | ICD-10-CM | POA: Diagnosis not present

## 2017-01-28 DIAGNOSIS — I10 Essential (primary) hypertension: Secondary | ICD-10-CM | POA: Diagnosis not present

## 2017-01-28 DIAGNOSIS — R197 Diarrhea, unspecified: Secondary | ICD-10-CM | POA: Diagnosis not present

## 2017-01-28 DIAGNOSIS — E782 Mixed hyperlipidemia: Secondary | ICD-10-CM | POA: Diagnosis not present

## 2017-01-31 DIAGNOSIS — D692 Other nonthrombocytopenic purpura: Secondary | ICD-10-CM | POA: Diagnosis not present

## 2017-01-31 DIAGNOSIS — E539 Vitamin B deficiency, unspecified: Secondary | ICD-10-CM | POA: Diagnosis not present

## 2017-01-31 DIAGNOSIS — R1011 Right upper quadrant pain: Secondary | ICD-10-CM | POA: Diagnosis not present

## 2017-01-31 DIAGNOSIS — Z6827 Body mass index (BMI) 27.0-27.9, adult: Secondary | ICD-10-CM | POA: Diagnosis not present

## 2017-01-31 DIAGNOSIS — I270 Primary pulmonary hypertension: Secondary | ICD-10-CM | POA: Diagnosis not present

## 2017-01-31 DIAGNOSIS — K580 Irritable bowel syndrome with diarrhea: Secondary | ICD-10-CM | POA: Diagnosis not present

## 2017-01-31 DIAGNOSIS — I10 Essential (primary) hypertension: Secondary | ICD-10-CM | POA: Diagnosis not present

## 2017-01-31 DIAGNOSIS — M797 Fibromyalgia: Secondary | ICD-10-CM | POA: Diagnosis not present

## 2017-02-08 DIAGNOSIS — Z23 Encounter for immunization: Secondary | ICD-10-CM | POA: Diagnosis not present

## 2017-02-08 DIAGNOSIS — I10 Essential (primary) hypertension: Secondary | ICD-10-CM | POA: Diagnosis not present

## 2017-02-08 DIAGNOSIS — M7062 Trochanteric bursitis, left hip: Secondary | ICD-10-CM | POA: Diagnosis not present

## 2017-02-19 DIAGNOSIS — Z1231 Encounter for screening mammogram for malignant neoplasm of breast: Secondary | ICD-10-CM | POA: Diagnosis not present

## 2017-02-22 DIAGNOSIS — E539 Vitamin B deficiency, unspecified: Secondary | ICD-10-CM | POA: Diagnosis not present

## 2017-02-27 DIAGNOSIS — K219 Gastro-esophageal reflux disease without esophagitis: Secondary | ICD-10-CM | POA: Diagnosis not present

## 2017-02-27 DIAGNOSIS — I10 Essential (primary) hypertension: Secondary | ICD-10-CM | POA: Diagnosis not present

## 2017-02-27 DIAGNOSIS — Z6828 Body mass index (BMI) 28.0-28.9, adult: Secondary | ICD-10-CM | POA: Diagnosis not present

## 2017-02-27 DIAGNOSIS — M797 Fibromyalgia: Secondary | ICD-10-CM | POA: Diagnosis not present

## 2017-02-27 DIAGNOSIS — R32 Unspecified urinary incontinence: Secondary | ICD-10-CM | POA: Diagnosis not present

## 2017-03-08 DIAGNOSIS — E539 Vitamin B deficiency, unspecified: Secondary | ICD-10-CM | POA: Diagnosis not present

## 2017-03-14 DIAGNOSIS — N390 Urinary tract infection, site not specified: Secondary | ICD-10-CM | POA: Diagnosis not present

## 2017-03-22 DIAGNOSIS — Z6828 Body mass index (BMI) 28.0-28.9, adult: Secondary | ICD-10-CM | POA: Diagnosis not present

## 2017-03-22 DIAGNOSIS — E539 Vitamin B deficiency, unspecified: Secondary | ICD-10-CM | POA: Diagnosis not present

## 2017-03-22 DIAGNOSIS — J309 Allergic rhinitis, unspecified: Secondary | ICD-10-CM | POA: Diagnosis not present

## 2017-04-02 ENCOUNTER — Other Ambulatory Visit (HOSPITAL_COMMUNITY): Payer: Self-pay | Admitting: Family Medicine

## 2017-04-02 DIAGNOSIS — Z1231 Encounter for screening mammogram for malignant neoplasm of breast: Secondary | ICD-10-CM

## 2017-04-05 DIAGNOSIS — E539 Vitamin B deficiency, unspecified: Secondary | ICD-10-CM | POA: Diagnosis not present

## 2017-04-09 DIAGNOSIS — Z6827 Body mass index (BMI) 27.0-27.9, adult: Secondary | ICD-10-CM | POA: Diagnosis not present

## 2017-04-09 DIAGNOSIS — R110 Nausea: Secondary | ICD-10-CM | POA: Diagnosis not present

## 2017-04-17 ENCOUNTER — Ambulatory Visit (HOSPITAL_COMMUNITY): Payer: Commercial Managed Care - HMO

## 2017-04-19 DIAGNOSIS — R110 Nausea: Secondary | ICD-10-CM | POA: Diagnosis not present

## 2017-04-19 DIAGNOSIS — K219 Gastro-esophageal reflux disease without esophagitis: Secondary | ICD-10-CM | POA: Diagnosis not present

## 2017-04-19 DIAGNOSIS — Z6827 Body mass index (BMI) 27.0-27.9, adult: Secondary | ICD-10-CM | POA: Diagnosis not present

## 2017-04-19 DIAGNOSIS — R197 Diarrhea, unspecified: Secondary | ICD-10-CM | POA: Diagnosis not present

## 2017-04-24 ENCOUNTER — Ambulatory Visit (HOSPITAL_COMMUNITY): Payer: Commercial Managed Care - HMO

## 2017-05-03 DIAGNOSIS — E539 Vitamin B deficiency, unspecified: Secondary | ICD-10-CM | POA: Diagnosis not present

## 2017-05-08 DIAGNOSIS — R928 Other abnormal and inconclusive findings on diagnostic imaging of breast: Secondary | ICD-10-CM | POA: Diagnosis not present

## 2017-05-08 DIAGNOSIS — R922 Inconclusive mammogram: Secondary | ICD-10-CM | POA: Diagnosis not present

## 2017-05-16 ENCOUNTER — Encounter: Payer: Self-pay | Admitting: Internal Medicine

## 2017-05-17 DIAGNOSIS — Z6827 Body mass index (BMI) 27.0-27.9, adult: Secondary | ICD-10-CM | POA: Diagnosis not present

## 2017-05-17 DIAGNOSIS — E539 Vitamin B deficiency, unspecified: Secondary | ICD-10-CM | POA: Diagnosis not present

## 2017-05-21 DIAGNOSIS — Z6827 Body mass index (BMI) 27.0-27.9, adult: Secondary | ICD-10-CM | POA: Diagnosis not present

## 2017-05-21 DIAGNOSIS — J301 Allergic rhinitis due to pollen: Secondary | ICD-10-CM | POA: Diagnosis not present

## 2017-05-21 DIAGNOSIS — E539 Vitamin B deficiency, unspecified: Secondary | ICD-10-CM | POA: Diagnosis not present

## 2017-05-21 DIAGNOSIS — K219 Gastro-esophageal reflux disease without esophagitis: Secondary | ICD-10-CM | POA: Diagnosis not present

## 2017-05-21 DIAGNOSIS — M797 Fibromyalgia: Secondary | ICD-10-CM | POA: Diagnosis not present

## 2017-05-21 DIAGNOSIS — I10 Essential (primary) hypertension: Secondary | ICD-10-CM | POA: Diagnosis not present

## 2017-05-21 DIAGNOSIS — R1011 Right upper quadrant pain: Secondary | ICD-10-CM | POA: Diagnosis not present

## 2017-05-21 DIAGNOSIS — I270 Primary pulmonary hypertension: Secondary | ICD-10-CM | POA: Diagnosis not present

## 2017-05-21 DIAGNOSIS — D692 Other nonthrombocytopenic purpura: Secondary | ICD-10-CM | POA: Diagnosis not present

## 2017-05-21 DIAGNOSIS — K580 Irritable bowel syndrome with diarrhea: Secondary | ICD-10-CM | POA: Diagnosis not present

## 2017-05-30 ENCOUNTER — Ambulatory Visit (HOSPITAL_COMMUNITY): Payer: Commercial Managed Care - HMO

## 2017-05-30 DIAGNOSIS — E539 Vitamin B deficiency, unspecified: Secondary | ICD-10-CM | POA: Diagnosis not present

## 2017-05-31 ENCOUNTER — Ambulatory Visit (HOSPITAL_COMMUNITY): Payer: Commercial Managed Care - HMO

## 2017-05-31 ENCOUNTER — Encounter (HOSPITAL_COMMUNITY): Payer: Self-pay

## 2017-06-12 ENCOUNTER — Telehealth: Payer: Self-pay | Admitting: Internal Medicine

## 2017-06-12 NOTE — Telephone Encounter (Signed)
Pt LMOM that she was 84 yrs old and doesn't want to schedule a colonoscopy. She had received a letter in the mail.

## 2017-06-14 DIAGNOSIS — M1612 Unilateral primary osteoarthritis, left hip: Secondary | ICD-10-CM | POA: Diagnosis not present

## 2017-06-14 DIAGNOSIS — M797 Fibromyalgia: Secondary | ICD-10-CM | POA: Diagnosis not present

## 2017-06-14 DIAGNOSIS — I270 Primary pulmonary hypertension: Secondary | ICD-10-CM | POA: Diagnosis not present

## 2017-06-14 DIAGNOSIS — Z1212 Encounter for screening for malignant neoplasm of rectum: Secondary | ICD-10-CM | POA: Diagnosis not present

## 2017-06-14 DIAGNOSIS — J301 Allergic rhinitis due to pollen: Secondary | ICD-10-CM | POA: Diagnosis not present

## 2017-06-14 DIAGNOSIS — K219 Gastro-esophageal reflux disease without esophagitis: Secondary | ICD-10-CM | POA: Diagnosis not present

## 2017-06-14 DIAGNOSIS — K580 Irritable bowel syndrome with diarrhea: Secondary | ICD-10-CM | POA: Diagnosis not present

## 2017-06-14 DIAGNOSIS — E539 Vitamin B deficiency, unspecified: Secondary | ICD-10-CM | POA: Diagnosis not present

## 2017-06-14 DIAGNOSIS — Z0001 Encounter for general adult medical examination with abnormal findings: Secondary | ICD-10-CM | POA: Diagnosis not present

## 2017-06-28 DIAGNOSIS — E539 Vitamin B deficiency, unspecified: Secondary | ICD-10-CM | POA: Diagnosis not present

## 2017-07-12 DIAGNOSIS — Z6827 Body mass index (BMI) 27.0-27.9, adult: Secondary | ICD-10-CM | POA: Diagnosis not present

## 2017-07-12 DIAGNOSIS — J069 Acute upper respiratory infection, unspecified: Secondary | ICD-10-CM | POA: Diagnosis not present

## 2017-07-12 DIAGNOSIS — D510 Vitamin B12 deficiency anemia due to intrinsic factor deficiency: Secondary | ICD-10-CM | POA: Diagnosis not present

## 2017-07-26 DIAGNOSIS — E539 Vitamin B deficiency, unspecified: Secondary | ICD-10-CM | POA: Diagnosis not present

## 2017-08-09 DIAGNOSIS — Z6827 Body mass index (BMI) 27.0-27.9, adult: Secondary | ICD-10-CM | POA: Diagnosis not present

## 2017-08-09 DIAGNOSIS — K219 Gastro-esophageal reflux disease without esophagitis: Secondary | ICD-10-CM | POA: Diagnosis not present

## 2017-08-09 DIAGNOSIS — D519 Vitamin B12 deficiency anemia, unspecified: Secondary | ICD-10-CM | POA: Diagnosis not present

## 2017-08-22 DIAGNOSIS — D519 Vitamin B12 deficiency anemia, unspecified: Secondary | ICD-10-CM | POA: Diagnosis not present

## 2017-08-28 DIAGNOSIS — L570 Actinic keratosis: Secondary | ICD-10-CM | POA: Diagnosis not present

## 2017-08-28 DIAGNOSIS — Z85820 Personal history of malignant melanoma of skin: Secondary | ICD-10-CM | POA: Diagnosis not present

## 2017-09-05 DIAGNOSIS — D519 Vitamin B12 deficiency anemia, unspecified: Secondary | ICD-10-CM | POA: Diagnosis not present

## 2017-09-06 ENCOUNTER — Ambulatory Visit: Payer: Medicare HMO | Admitting: Gastroenterology

## 2017-09-09 DIAGNOSIS — K580 Irritable bowel syndrome with diarrhea: Secondary | ICD-10-CM | POA: Diagnosis not present

## 2017-09-09 DIAGNOSIS — I10 Essential (primary) hypertension: Secondary | ICD-10-CM | POA: Diagnosis not present

## 2017-09-09 DIAGNOSIS — D510 Vitamin B12 deficiency anemia due to intrinsic factor deficiency: Secondary | ICD-10-CM | POA: Diagnosis not present

## 2017-09-09 DIAGNOSIS — R5383 Other fatigue: Secondary | ICD-10-CM | POA: Diagnosis not present

## 2017-09-09 DIAGNOSIS — Z9189 Other specified personal risk factors, not elsewhere classified: Secondary | ICD-10-CM | POA: Diagnosis not present

## 2017-09-09 DIAGNOSIS — I270 Primary pulmonary hypertension: Secondary | ICD-10-CM | POA: Diagnosis not present

## 2017-09-09 DIAGNOSIS — R42 Dizziness and giddiness: Secondary | ICD-10-CM | POA: Diagnosis not present

## 2017-09-09 DIAGNOSIS — K210 Gastro-esophageal reflux disease with esophagitis: Secondary | ICD-10-CM | POA: Diagnosis not present

## 2017-09-09 DIAGNOSIS — E782 Mixed hyperlipidemia: Secondary | ICD-10-CM | POA: Diagnosis not present

## 2017-09-11 DIAGNOSIS — Z6826 Body mass index (BMI) 26.0-26.9, adult: Secondary | ICD-10-CM | POA: Diagnosis not present

## 2017-09-11 DIAGNOSIS — M7062 Trochanteric bursitis, left hip: Secondary | ICD-10-CM | POA: Diagnosis not present

## 2017-09-11 DIAGNOSIS — Z1331 Encounter for screening for depression: Secondary | ICD-10-CM | POA: Diagnosis not present

## 2017-09-11 DIAGNOSIS — E539 Vitamin B deficiency, unspecified: Secondary | ICD-10-CM | POA: Diagnosis not present

## 2017-09-11 DIAGNOSIS — I270 Primary pulmonary hypertension: Secondary | ICD-10-CM | POA: Diagnosis not present

## 2017-09-11 DIAGNOSIS — I10 Essential (primary) hypertension: Secondary | ICD-10-CM | POA: Diagnosis not present

## 2017-09-11 DIAGNOSIS — Z1389 Encounter for screening for other disorder: Secondary | ICD-10-CM | POA: Diagnosis not present

## 2017-09-11 DIAGNOSIS — K580 Irritable bowel syndrome with diarrhea: Secondary | ICD-10-CM | POA: Diagnosis not present

## 2017-09-19 DIAGNOSIS — D519 Vitamin B12 deficiency anemia, unspecified: Secondary | ICD-10-CM | POA: Diagnosis not present

## 2017-09-20 DIAGNOSIS — M461 Sacroiliitis, not elsewhere classified: Secondary | ICD-10-CM | POA: Diagnosis not present

## 2017-09-20 DIAGNOSIS — Z6826 Body mass index (BMI) 26.0-26.9, adult: Secondary | ICD-10-CM | POA: Diagnosis not present

## 2017-09-20 DIAGNOSIS — M545 Low back pain: Secondary | ICD-10-CM | POA: Diagnosis not present

## 2017-09-20 DIAGNOSIS — K219 Gastro-esophageal reflux disease without esophagitis: Secondary | ICD-10-CM | POA: Diagnosis not present

## 2017-09-26 DIAGNOSIS — E039 Hypothyroidism, unspecified: Secondary | ICD-10-CM | POA: Diagnosis not present

## 2017-09-26 DIAGNOSIS — J301 Allergic rhinitis due to pollen: Secondary | ICD-10-CM | POA: Diagnosis not present

## 2017-09-26 DIAGNOSIS — Z6827 Body mass index (BMI) 27.0-27.9, adult: Secondary | ICD-10-CM | POA: Diagnosis not present

## 2017-09-26 DIAGNOSIS — N3281 Overactive bladder: Secondary | ICD-10-CM | POA: Diagnosis not present

## 2017-10-01 DIAGNOSIS — R300 Dysuria: Secondary | ICD-10-CM | POA: Diagnosis not present

## 2017-10-01 DIAGNOSIS — N3000 Acute cystitis without hematuria: Secondary | ICD-10-CM | POA: Diagnosis not present

## 2017-10-01 DIAGNOSIS — E785 Hyperlipidemia, unspecified: Secondary | ICD-10-CM | POA: Diagnosis not present

## 2017-10-01 DIAGNOSIS — K219 Gastro-esophageal reflux disease without esophagitis: Secondary | ICD-10-CM | POA: Diagnosis not present

## 2017-10-01 DIAGNOSIS — K588 Other irritable bowel syndrome: Secondary | ICD-10-CM | POA: Diagnosis not present

## 2017-10-04 DIAGNOSIS — D510 Vitamin B12 deficiency anemia due to intrinsic factor deficiency: Secondary | ICD-10-CM | POA: Diagnosis not present

## 2017-10-09 DIAGNOSIS — E039 Hypothyroidism, unspecified: Secondary | ICD-10-CM | POA: Diagnosis not present

## 2017-10-25 ENCOUNTER — Ambulatory Visit: Payer: Medicare HMO | Admitting: Gastroenterology

## 2017-10-31 DIAGNOSIS — K219 Gastro-esophageal reflux disease without esophagitis: Secondary | ICD-10-CM | POA: Diagnosis not present

## 2017-10-31 DIAGNOSIS — E785 Hyperlipidemia, unspecified: Secondary | ICD-10-CM | POA: Diagnosis not present

## 2017-10-31 DIAGNOSIS — D510 Vitamin B12 deficiency anemia due to intrinsic factor deficiency: Secondary | ICD-10-CM | POA: Diagnosis not present

## 2017-10-31 DIAGNOSIS — K589 Irritable bowel syndrome without diarrhea: Secondary | ICD-10-CM | POA: Diagnosis not present

## 2017-11-01 DIAGNOSIS — K589 Irritable bowel syndrome without diarrhea: Secondary | ICD-10-CM | POA: Diagnosis not present

## 2017-11-01 DIAGNOSIS — E785 Hyperlipidemia, unspecified: Secondary | ICD-10-CM | POA: Diagnosis not present

## 2017-11-01 DIAGNOSIS — K219 Gastro-esophageal reflux disease without esophagitis: Secondary | ICD-10-CM | POA: Diagnosis not present

## 2017-11-15 DIAGNOSIS — D519 Vitamin B12 deficiency anemia, unspecified: Secondary | ICD-10-CM | POA: Diagnosis not present

## 2017-11-18 DIAGNOSIS — M85832 Other specified disorders of bone density and structure, left forearm: Secondary | ICD-10-CM | POA: Diagnosis not present

## 2017-11-18 DIAGNOSIS — M8588 Other specified disorders of bone density and structure, other site: Secondary | ICD-10-CM | POA: Diagnosis not present

## 2017-11-18 DIAGNOSIS — M85851 Other specified disorders of bone density and structure, right thigh: Secondary | ICD-10-CM | POA: Diagnosis not present

## 2017-11-18 DIAGNOSIS — M810 Age-related osteoporosis without current pathological fracture: Secondary | ICD-10-CM | POA: Diagnosis not present

## 2017-11-21 DIAGNOSIS — E039 Hypothyroidism, unspecified: Secondary | ICD-10-CM | POA: Diagnosis not present

## 2017-11-29 DIAGNOSIS — E785 Hyperlipidemia, unspecified: Secondary | ICD-10-CM | POA: Diagnosis not present

## 2017-11-29 DIAGNOSIS — K219 Gastro-esophageal reflux disease without esophagitis: Secondary | ICD-10-CM | POA: Diagnosis not present

## 2017-11-29 DIAGNOSIS — Z6826 Body mass index (BMI) 26.0-26.9, adult: Secondary | ICD-10-CM | POA: Diagnosis not present

## 2017-11-29 DIAGNOSIS — K589 Irritable bowel syndrome without diarrhea: Secondary | ICD-10-CM | POA: Diagnosis not present

## 2017-11-29 DIAGNOSIS — D510 Vitamin B12 deficiency anemia due to intrinsic factor deficiency: Secondary | ICD-10-CM | POA: Diagnosis not present

## 2017-12-04 DIAGNOSIS — J209 Acute bronchitis, unspecified: Secondary | ICD-10-CM | POA: Diagnosis not present

## 2017-12-04 DIAGNOSIS — Z6825 Body mass index (BMI) 25.0-25.9, adult: Secondary | ICD-10-CM | POA: Diagnosis not present

## 2017-12-12 DIAGNOSIS — Z6825 Body mass index (BMI) 25.0-25.9, adult: Secondary | ICD-10-CM | POA: Diagnosis not present

## 2017-12-12 DIAGNOSIS — K219 Gastro-esophageal reflux disease without esophagitis: Secondary | ICD-10-CM | POA: Diagnosis not present

## 2017-12-12 DIAGNOSIS — M461 Sacroiliitis, not elsewhere classified: Secondary | ICD-10-CM | POA: Diagnosis not present

## 2017-12-12 DIAGNOSIS — K580 Irritable bowel syndrome with diarrhea: Secondary | ICD-10-CM | POA: Diagnosis not present

## 2017-12-12 DIAGNOSIS — I10 Essential (primary) hypertension: Secondary | ICD-10-CM | POA: Diagnosis not present

## 2017-12-12 DIAGNOSIS — I270 Primary pulmonary hypertension: Secondary | ICD-10-CM | POA: Diagnosis not present

## 2017-12-12 DIAGNOSIS — E539 Vitamin B deficiency, unspecified: Secondary | ICD-10-CM | POA: Diagnosis not present

## 2017-12-12 DIAGNOSIS — M797 Fibromyalgia: Secondary | ICD-10-CM | POA: Diagnosis not present

## 2017-12-27 DIAGNOSIS — I10 Essential (primary) hypertension: Secondary | ICD-10-CM | POA: Diagnosis not present

## 2017-12-27 DIAGNOSIS — D510 Vitamin B12 deficiency anemia due to intrinsic factor deficiency: Secondary | ICD-10-CM | POA: Diagnosis not present

## 2018-01-09 DIAGNOSIS — D518 Other vitamin B12 deficiency anemias: Secondary | ICD-10-CM | POA: Diagnosis not present

## 2018-01-09 DIAGNOSIS — K219 Gastro-esophageal reflux disease without esophagitis: Secondary | ICD-10-CM | POA: Diagnosis not present

## 2018-01-09 DIAGNOSIS — Z6825 Body mass index (BMI) 25.0-25.9, adult: Secondary | ICD-10-CM | POA: Diagnosis not present

## 2018-01-13 ENCOUNTER — Encounter: Payer: Self-pay | Admitting: Gastroenterology

## 2018-01-13 ENCOUNTER — Ambulatory Visit: Payer: Medicare HMO | Admitting: Gastroenterology

## 2018-01-13 VITALS — BP 140/68 | HR 77 | Temp 97.2°F | Ht 65.0 in | Wt 142.6 lb

## 2018-01-13 DIAGNOSIS — R1013 Epigastric pain: Secondary | ICD-10-CM | POA: Diagnosis not present

## 2018-01-13 NOTE — Progress Notes (Signed)
Referring Provider: Caryl Bis, MD Primary Care Physician:  Caryl Bis, MD Primary GI: Dr. Gala Romney   Chief Complaint  Patient presents with  . Gastroesophageal Reflux    HPI:   Sabrina Morrison is an 84 y.o. female presenting today with a history of chronic GERD, gastroparesis, history of IDA in the past s/p thorough evaluation.   Started multiple OTC vitamins to include B12 500 mg, Vitamin 500 mg, Cranberry 4200 mg with VIt C, Magnesium 250 mg, Digestive advantage.   Omeprazole 20 mg but has increased to 40 mg each morning. Has helped the acid, grumbling in stomach. Carafate as needed, chronically.   Vague upper abdominal discomfort. Last EGD in 2014.   Past Medical History:  Diagnosis Date  . Allergic rhinitis   . Allergy   . Asthma   . Cancer ([place])    skin cancer form left arm  . Cataract   . Gastroparesis 08/2008   mild, 35% retention at 2 hours (normal is less than 30% at 2 hours)  . GERD (gastroesophageal reflux disease)   . Hiatal hernia 09/01/2004   large  . Hyperplastic colon polyp   . Hypothyroidism   . IDA (iron deficiency anemia)   . Irritable bowel syndrome   . Muscle pain, fibromyalgia   . PONV (postoperative nausea and vomiting)   . Schatzki's ring    h/o 8 EGDs  . Shortness of breath   . Tubular adenoma     Past Surgical History:  Procedure Laterality Date  . ABDOMINAL HYSTERECTOMY    . APPENDECTOMY    . ARM SKIN LESION BIOPSY / EXCISION     left arm  . CATARACT EXTRACTION W/PHACO  08/27/2011   Procedure: CATARACT EXTRACTION PHACO AND INTRAOCULAR LENS PLACEMENT (IOC);  Surgeon: Tonny Branch, MD;  Location: [facility];  Service: Ophthalmology;  Laterality: Right;  CDE: 15.12  . COLON SURGERY    . COLONOSCOPY  09/01/2004   RMR: normal, due 08/2014  . COLONOSCOPY WITH ESOPHAGOGASTRODUODENOSCOPY (EGD)  06/05/2012   Dr. Gala Romney- EGD- "corkscrewed" tubular esophagus.  hiatal hernia beign bx. TCS- colonic divertiulosis, tubular adenoma, hyperplastic  polyp  . DILATION AND CURETTAGE OF UTERUS     x2  . ESOPHAGOGASTRODUODENOSCOPY  8/08   RMR: cork-screwed esophagus,prominent Schatki's ring large hh  . ESOPHAGOGASTRODUODENOSCOPY  04/18/10   prominent schatzki's ring/large hiatal hernia  otherwise normal stomach  . ESOPHAGOGASTRODUODENOSCOPY   09/25/2002   RMR: Prominent Schatski's ring, foreshortened esophagus, moderate size hiatal hernia.  The remainder of the stomach and duodenum through the second portion appeared normal.  Status post balloon dilation of ring,  . ESOPHAGOGASTRODUODENOSCOPY  01/28/2004   WCB:[id number]'V ring status post dilation as described above. Otherwise normal/ Large hiatal hernia  . ESOPHAGOGASTRODUODENOSCOPY  09/01/2004   RMR: Schatzki's ring, otherwise normal esophagus status post dilation/Moderately large hiatal hernia  . ESOPHAGOGASTRODUODENOSCOPY   12/14/2005   RMR: Schatzki's ring, otherwise normal esophagus status post dilation /Large hiatal hernia  . EYE SURGERY    . GIVENS CAPSULE STUDY  06/26/2012   Dr. Gala Romney- probable 2 superficial erosions in the mid small bowel, no evidence of tumor or blood anywhere in the small intestine.  . S/P Hysterectomy    . segmental colectomy     with incidental appendectomy in [place]  . YAG LASER APPLICATION Bilateral 11/18/735   Procedure: YAG LASER APPLICATION;  Surgeon: Williams Che, MD;  Location: [facility];  Service: Ophthalmology;  Laterality: Bilateral;    Current Outpatient  Medications  Medication Sig Dispense Refill  . acetaminophen (TYLENOL) 500 MG tablet Take 500 mg by mouth every 6 (six) hours as needed. pain    . albuterol (PROVENTIL HFA;VENTOLIN HFA) 108 (90 BASE) MCG/ACT inhaler Inhale into the lungs every 6 (six) hours as needed for wheezing or shortness of breath (using 1-3 times per week as needed).    . bismuth subsalicylate (PEPTO BISMOL) 262 MG chewable tablet Chew 524 mg by mouth as needed.    Marland Kitchen CARAFATE 1 GM/10ML suspension Take 1 g by mouth as  needed.     . Cranberry 500 MG CAPS Take 500 mg by mouth daily.    . cyanocobalamin (,VITAMIN B-12,) 1000 MCG/ML injection Inject 1,000 mcg into the muscle every 14 (fourteen) days.    . diphenoxylate-atropine (LOMOTIL) 2.5-0.025 MG per tablet Take 1 tablet by mouth 4 (four) times daily as needed. diarrhea    . fexofenadine (ALLEGRA) 180 MG tablet Take 180 mg by mouth daily.    . fluticasone (FLONASE) 50 MCG/ACT nasal spray Place 2 sprays into the nose daily as needed. Congestion    . levothyroxine (SYNTHROID, LEVOTHROID) 100 MCG tablet Take 100 mcg by mouth daily before breakfast.     . loperamide (IMODIUM) 2 MG capsule Take by mouth as needed for diarrhea or loose stools.    Marland Kitchen omeprazole (PRILOSEC) 20 MG capsule Take 2 capsules by mouth daily.    . Simethicone (GAS-X PO) Take 180 mg by mouth 4 (four) times daily as needed.     . traMADol (ULTRAM) 50 MG tablet Take 50 mg by mouth as needed.     . Alpha-D-Galactosidase (BEANO PO) Take by mouth daily.    . beclomethasone (QVAR) 80 MCG/ACT inhaler Inhale 2 puffs into the lungs 2 (two) times daily. Shortness of breath    . dexlansoprazole (DEXILANT) 60 MG capsule Take 1 capsule (60 mg total) by mouth daily. (Patient not taking: Reported on 01/13/2018) 30 capsule 1  . DULERA 200-5 MCG/ACT AERO Inhale 2 puffs into the lungs 2 (two) times daily.     . DULoxetine (CYMBALTA) 30 MG capsule Take 30 mg by mouth daily after breakfast.    . DULoxetine (CYMBALTA) 60 MG capsule Take 60 mg by mouth at bedtime.    Marland Kitchen EPIPEN 2-PAK 0.3 MG/0.3ML DEVI Inject 0.3 mg as directed as needed. Allergic Reaction    . HYDROcodone-acetaminophen (NORCO/VICODIN) 5-325 MG per tablet as needed.    . lansoprazole (PREVACID) 30 MG capsule Take 30 mg by mouth 2 (two) times daily.     Marland Kitchen levothyroxine (SYNTHROID, LEVOTHROID) 112 MCG tablet Take 112 mcg by mouth daily before breakfast.    . mirabegron ER (MYRBETRIQ) 25 MG TB24 tablet Take 25 mg by mouth daily.    . ondansetron (ZOFRAN)  4 MG tablet Take 1 tablet (4 mg total) by mouth every 6 (six) hours as needed for nausea or vomiting. (Patient not taking: Reported on 01/13/2018) 20 tablet 0  . prochlorperazine (COMPAZINE) 5 MG tablet Take 5 mg by mouth every 6 (six) hours as needed. Nausea,vomiting    . solifenacin (VESICARE) 5 MG tablet Take 5 mg by mouth daily. Take 1/2 tablet for a total of 2.5mg  every day.     No current facility-administered medications for this visit.     Allergies as of 01/13/2018 - Review Complete 01/13/2018  Allergen Reaction Noted  . Codeine Nausea And Vomiting   . Coumadin [warfarin]  06/19/2012  . Dexlansoprazole  04/05/2010  . Erythromycin  ethylsuccinate  04/05/2010  . Esomeprazole magnesium Nausea And Vomiting   . Moxifloxacin Nausea And Vomiting 04/05/2010  . Omeprazole  04/05/2010  . Sulfonamide derivatives Nausea And Vomiting     Family History  Problem Relation Age of Onset  . Heart attack Father 50       deceased  . Colon cancer Neg Hx   . Anesthesia problems Neg Hx   . Hypotension Neg Hx   . Malignant hyperthermia Neg Hx   . Pseudochol deficiency Neg Hx     Social History   Socioeconomic History  . Marital status: Widowed    Spouse name: Not on file  . Number of children: 2  . Years of education: Not on file  . Highest education level: Not on file  Occupational History  . Occupation: taking computer classes  Social Needs  . Financial resource strain: Not on file  . Food insecurity:    Worry: Not on file    Inability: Not on file  . Transportation needs:    Medical: Not on file    Non-medical: Not on file  Tobacco Use  . Smoking status: Never Smoker  . Smokeless tobacco: Never Used  Substance and Sexual Activity  . Alcohol use: No  . Drug use: No  . Sexual activity: Never    Birth control/protection: Post-menopausal  Lifestyle  . Physical activity:    Days per week: Not on file    Minutes per session: Not on file  . Stress: Not on file  Relationships    . Social connections:    Talks on phone: Not on file    Gets together: Not on file    Attends religious service: Not on file    Active member of club or organization: Not on file    Attends meetings of clubs or organizations: Not on file    Relationship status: Not on file  Other Topics Concern  . Not on file  Social History Narrative  . Not on file    Review of Systems: Gen: Denies fever, chills, anorexia. Denies fatigue, weakness, weight loss.  CV: Denies chest pain, palpitations, syncope, peripheral edema, and claudication. Resp: Denies dyspnea at rest, cough, wheezing, coughing up blood, and pleurisy. GI: see HPI  Derm: Denies rash, itching, dry skin Psych: Denies depression, anxiety, memory loss, confusion. No homicidal or suicidal ideation.  Heme: Denies bruising, bleeding, and enlarged lymph nodes.  Physical Exam: BP 140/68   Pulse 77   Temp (!) 97.2 F (36.2 C) (Oral)   Ht 5\' 5"  (1.651 m)   Wt 142 lb 9.6 oz (64.7 kg)   BMI 23.73 kg/m  General:   Alert and oriented. No distress noted. Pleasant and cooperative.  Head:  Normocephalic and atraumatic. Eyes:  Conjuctiva clear without scleral icterus. Mouth:  Oral mucosa pink and moist.  Abdomen:  +BS, soft, non-tender and non-distended. No rebound or guarding. No HSM or masses noted. Msk:  Symmetrical without gross deformities. Normal posture. Extremities:  Without edema. Neurologic:  Alert and  oriented x4 Psych:  Alert and cooperative. Normal mood and affect.

## 2018-01-13 NOTE — Patient Instructions (Signed)
Stop Prilosec. Start taking the samples of Dexilant once each morning.   Let me know how you are doing in a few weeks!  We will see you in 3 months or sooner if needed!  It was a pleasure to see you today. I strive to create trusting relationships with patients to provide genuine, compassionate, and quality care. I value your feedback. If you receive a survey regarding your visit,  I greatly appreciate you taking time to fill this out.   Annitta Needs, PhD, ANP-BC [facility]

## 2018-01-15 NOTE — Progress Notes (Signed)
cc'ed to pcp °

## 2018-01-15 NOTE — Assessment & Plan Note (Signed)
84 year old female with worsening GERD, vague epigastric pain but some improvement noted with increasing omeprazole to 40 mg daily prior to visit. Will trial Dexilant now. No alarm symptoms. Last EGD in 2014. If no improvement with pain, may need EGD. Patient to call with progress report regarding Dexilant. Return in 3 months.

## 2018-01-16 ENCOUNTER — Telehealth: Payer: Self-pay

## 2018-01-16 NOTE — Telephone Encounter (Signed)
VM was left about Dexilant samples. Spoke with pt, pt is aware that samples are ready for pickup.

## 2018-01-21 DIAGNOSIS — H524 Presbyopia: Secondary | ICD-10-CM | POA: Diagnosis not present

## 2018-01-23 DIAGNOSIS — Z6825 Body mass index (BMI) 25.0-25.9, adult: Secondary | ICD-10-CM | POA: Diagnosis not present

## 2018-01-23 DIAGNOSIS — D518 Other vitamin B12 deficiency anemias: Secondary | ICD-10-CM | POA: Diagnosis not present

## 2018-01-23 DIAGNOSIS — K219 Gastro-esophageal reflux disease without esophagitis: Secondary | ICD-10-CM | POA: Diagnosis not present

## 2018-02-12 DIAGNOSIS — M1612 Unilateral primary osteoarthritis, left hip: Secondary | ICD-10-CM | POA: Diagnosis not present

## 2018-02-12 DIAGNOSIS — M5410 Radiculopathy, site unspecified: Secondary | ICD-10-CM | POA: Diagnosis not present

## 2018-02-13 DIAGNOSIS — D519 Vitamin B12 deficiency anemia, unspecified: Secondary | ICD-10-CM | POA: Diagnosis not present

## 2018-02-19 DIAGNOSIS — Z6825 Body mass index (BMI) 25.0-25.9, adult: Secondary | ICD-10-CM | POA: Diagnosis not present

## 2018-02-19 DIAGNOSIS — R350 Frequency of micturition: Secondary | ICD-10-CM | POA: Diagnosis not present

## 2018-02-19 DIAGNOSIS — M545 Low back pain: Secondary | ICD-10-CM | POA: Diagnosis not present

## 2018-02-26 DIAGNOSIS — R69 Illness, unspecified: Secondary | ICD-10-CM | POA: Diagnosis not present

## 2018-02-28 DIAGNOSIS — D510 Vitamin B12 deficiency anemia due to intrinsic factor deficiency: Secondary | ICD-10-CM | POA: Diagnosis not present

## 2018-03-03 DIAGNOSIS — K219 Gastro-esophageal reflux disease without esophagitis: Secondary | ICD-10-CM | POA: Diagnosis not present

## 2018-03-03 DIAGNOSIS — R300 Dysuria: Secondary | ICD-10-CM | POA: Diagnosis not present

## 2018-03-03 DIAGNOSIS — E039 Hypothyroidism, unspecified: Secondary | ICD-10-CM | POA: Diagnosis not present

## 2018-03-03 DIAGNOSIS — Z6825 Body mass index (BMI) 25.0-25.9, adult: Secondary | ICD-10-CM | POA: Diagnosis not present

## 2018-03-12 DIAGNOSIS — Z23 Encounter for immunization: Secondary | ICD-10-CM | POA: Diagnosis not present

## 2018-03-12 DIAGNOSIS — Z6825 Body mass index (BMI) 25.0-25.9, adult: Secondary | ICD-10-CM | POA: Diagnosis not present

## 2018-03-12 DIAGNOSIS — R300 Dysuria: Secondary | ICD-10-CM | POA: Diagnosis not present

## 2018-03-14 DIAGNOSIS — E539 Vitamin B deficiency, unspecified: Secondary | ICD-10-CM | POA: Diagnosis not present

## 2018-03-17 DIAGNOSIS — I10 Essential (primary) hypertension: Secondary | ICD-10-CM | POA: Diagnosis not present

## 2018-03-17 DIAGNOSIS — E039 Hypothyroidism, unspecified: Secondary | ICD-10-CM | POA: Diagnosis not present

## 2018-03-17 DIAGNOSIS — D649 Anemia, unspecified: Secondary | ICD-10-CM | POA: Diagnosis not present

## 2018-03-17 DIAGNOSIS — M797 Fibromyalgia: Secondary | ICD-10-CM | POA: Diagnosis not present

## 2018-03-17 DIAGNOSIS — E782 Mixed hyperlipidemia: Secondary | ICD-10-CM | POA: Diagnosis not present

## 2018-03-17 DIAGNOSIS — K210 Gastro-esophageal reflux disease with esophagitis: Secondary | ICD-10-CM | POA: Diagnosis not present

## 2018-03-21 DIAGNOSIS — K219 Gastro-esophageal reflux disease without esophagitis: Secondary | ICD-10-CM | POA: Diagnosis not present

## 2018-03-21 DIAGNOSIS — R350 Frequency of micturition: Secondary | ICD-10-CM | POA: Diagnosis not present

## 2018-03-21 DIAGNOSIS — N3000 Acute cystitis without hematuria: Secondary | ICD-10-CM | POA: Diagnosis not present

## 2018-03-21 DIAGNOSIS — J301 Allergic rhinitis due to pollen: Secondary | ICD-10-CM | POA: Diagnosis not present

## 2018-03-21 DIAGNOSIS — M461 Sacroiliitis, not elsewhere classified: Secondary | ICD-10-CM | POA: Diagnosis not present

## 2018-03-21 DIAGNOSIS — M797 Fibromyalgia: Secondary | ICD-10-CM | POA: Diagnosis not present

## 2018-03-21 DIAGNOSIS — I10 Essential (primary) hypertension: Secondary | ICD-10-CM | POA: Diagnosis not present

## 2018-03-21 DIAGNOSIS — Z6825 Body mass index (BMI) 25.0-25.9, adult: Secondary | ICD-10-CM | POA: Diagnosis not present

## 2018-03-21 DIAGNOSIS — I270 Primary pulmonary hypertension: Secondary | ICD-10-CM | POA: Diagnosis not present

## 2018-03-21 DIAGNOSIS — D692 Other nonthrombocytopenic purpura: Secondary | ICD-10-CM | POA: Diagnosis not present

## 2018-03-21 DIAGNOSIS — M545 Low back pain: Secondary | ICD-10-CM | POA: Diagnosis not present

## 2018-03-21 DIAGNOSIS — K580 Irritable bowel syndrome with diarrhea: Secondary | ICD-10-CM | POA: Diagnosis not present

## 2018-03-21 DIAGNOSIS — E539 Vitamin B deficiency, unspecified: Secondary | ICD-10-CM | POA: Diagnosis not present

## 2018-03-28 DIAGNOSIS — D510 Vitamin B12 deficiency anemia due to intrinsic factor deficiency: Secondary | ICD-10-CM | POA: Diagnosis not present

## 2018-04-02 DIAGNOSIS — R350 Frequency of micturition: Secondary | ICD-10-CM | POA: Diagnosis not present

## 2018-04-02 DIAGNOSIS — Z6825 Body mass index (BMI) 25.0-25.9, adult: Secondary | ICD-10-CM | POA: Diagnosis not present

## 2018-04-11 DIAGNOSIS — D510 Vitamin B12 deficiency anemia due to intrinsic factor deficiency: Secondary | ICD-10-CM | POA: Diagnosis not present

## 2018-04-18 ENCOUNTER — Ambulatory Visit: Payer: Self-pay | Admitting: Allergy & Immunology

## 2018-04-23 ENCOUNTER — Encounter: Payer: Self-pay | Admitting: Allergy & Immunology

## 2018-04-23 ENCOUNTER — Ambulatory Visit: Payer: Medicare HMO | Admitting: Allergy & Immunology

## 2018-04-23 VITALS — BP 122/74 | HR 65 | Temp 97.9°F | Resp 14 | Ht 62.0 in | Wt 143.0 lb

## 2018-04-23 DIAGNOSIS — J4520 Mild intermittent asthma, uncomplicated: Secondary | ICD-10-CM

## 2018-04-23 DIAGNOSIS — J310 Chronic rhinitis: Secondary | ICD-10-CM | POA: Diagnosis not present

## 2018-04-23 MED ORDER — CETIRIZINE HCL 10 MG PO TABS: 10.0000 mg | ORAL_TABLET | Freq: Every day | ORAL | 5 refills | Status: DC

## 2018-04-23 MED ORDER — AZELASTINE HCL 0.1 % NA SOLN: 2.0000 | Freq: Two times a day (BID) | NASAL | 3 refills | Status: DC

## 2018-04-23 MED ORDER — IPRATROPIUM BROMIDE 0.03 % NA SOLN: 1.0000 | Freq: Four times a day (QID) | NASAL | 12 refills | Status: DC | PRN

## 2018-04-23 NOTE — Patient Instructions (Addendum)
1. Moderate intermittent asthma, uncomplicated - Lung testing did not look very good today. - You are coughing nightly but we can hold off on asthma treatment until we manage your postnasl drip.   2. Chronic rhinitis - We will get some blood testing to see what might show up. - Stop taking: loratadine - Continue with fluticasone 2 sprays per nostril daily.  - Start taking: Zyrtec (cetirizine) 10mg  tablet once daily and Astelin (azelastine) 2 sprays per nostril 1-2 times daily as needed  - Start taking: Atrovent (ipratropium) nasal spray 1 spray per nostril every 6 hours as needed for runny noses - You can use an extra dose of the antihistamine, if needed, for breakthrough symptoms.  - Consider nasal saline rinses 1-2 times daily to remove allergens from the nasal cavities as well as help with mucous clearance (this is especially helpful to do before the nasal sprays are given)  3. Return in about 4 weeks (around 05/21/2018).   Please inform us of any Emergency Department visits, hospitalizations, or changes in symptoms. Call us before going to the ED for breathing or allergy symptoms since we might be able to fit you in for a sick visit. Feel free to contact us anytime with any questions, problems, or concerns.  It was a pleasure to meet you today!  Websites that have reliable patient information: 1. American Academy of Asthma, Allergy, and Immunology: www.aaaai.org 2. Food Allergy Research and Education (FARE): foodallergy.org 3. Mothers of Asthmatics: http://www.asthmacommunitynetwork.org 4. American College of Allergy, Asthma, and Immunology: MonthlyElectricBill.co.uk   Make sure you are registered to vote! If you have moved or changed any of your contact information, you will need to get this updated before voting!

## 2018-04-23 NOTE — Progress Notes (Signed)
NEW PATIENT  Date of Service/Encounter:  04/23/18  Referring provider: Caryl Bis, MD   Assessment:   Mild intermittent asthma, uncomplicated   Chronic rhinitis   Sabrina Morrison is a rather amusing 84 year old female presenting for evaluation of rhinorrhea.  She does have a remote history of asthma as well, and in fact is wheezing slightly today.  Is not interested in discussing Sabrina Morrison asthma and instead thinks that this is all secondary to Sabrina Morrison postnasal drip.  For, we will address that today aggressively with 3 different nasal sprays.  We also going to get some blood work, which I anticipate will be negative.  We are adding on an antihistamine daily as well (replacing the loratadine with cetirizine).    Plan/Recommendations:   1. Moderate intermittent asthma, uncomplicated - Lung testing did not look very good today. - You are coughing nightly but we can hold off on asthma treatment until we manage your postnasl drip.   2. Chronic rhinitis - We will get some blood testing to see what might show up. - Stop taking: loratadine - Continue with fluticasone 2 sprays per nostril daily.  - Start taking: Zyrtec (cetirizine) 10mg  tablet once daily and Astelin (azelastine) 2 sprays per nostril 1-2 times daily as needed  - Start taking: Atrovent (ipratropium) nasal spray 1 spray per nostril every 6 hours as needed for runny noses - You can use an extra dose of the antihistamine, if needed, for breakthrough symptoms.  - Consider nasal saline rinses 1-2 times daily to remove allergens from the nasal cavities as well as help with mucous clearance (this is especially helpful to do before the nasal sprays are given)  3. Return in about 4 weeks (around 05/21/2018).  Subjective:   Sabrina Morrison is a 84 y.o. female presenting today for evaluation of  Chief Complaint  Patient presents with  . Nasal Congestion    Sabrina Morrison has a history of the following: Patient Active Problem List     Diagnosis Date Noted  . Mild intermittent asthma, uncomplicated 38/45/3646  . Chronic rhinitis 04/23/2018  . Anemia 08/11/2014  . Occlusion and stenosis of carotid artery without mention of cerebral infarction 11/10/2012  . IDA (iron deficiency anemia) 05/25/2012  . HIATAL HERNIA 04/05/2010  . GASTROPARESIS 09/20/2008  . HEADACHE 09/20/2008  . HOARSENESS 09/20/2008  . NAUSEA 09/20/2008  . Esophageal dysphagia 09/20/2008  . DIARRHEA 09/20/2008  . EPIGASTRIC PAIN 09/20/2008  . SCHATZKI'S RING, HX OF 09/20/2008  . HYPOTHYROIDISM 03/08/2007  . ALLERGIC RHINITIS 03/08/2007  . ASTHMA 03/08/2007  . GERD 03/08/2007    History obtained from: chart review and patient.  Sabrina Morrison was referred by Caryl Bis, MD.     Sabrina Morrison is a 84 y.o. female presenting to re-establish care. She reports that has a runny nose and is coughing and sneezing. She has been on loratadine which did not help. She went up to two daily and this is not helping much. She is using fluticasone 4-5 sprays per nostril daily. This is the only nose sprays that she has tried. Symptoms have been ongoing for a period of years.  However, they seem to have gotten worse in the last year.  Is particularly concerned because she is the only one who is sniffing during church and she feels that everybody is looking at Sabrina Morrison.  She was previously followed by Dr. Neldon Mc.  We last saw Sabrina Morrison in May 2016.  At that time, he recommended continuing Prevacid 30  mg twice daily as well as duo nebs as needed.  He also recommended starting budesonide 0.5 mg nebulized twice daily.  She has had multiple allergy tests throughout the years, and has never had anything show up that it was positive.  Sabrina Morrison last allergy testing was via blood in April 2012 and was positive to the entire panel.  However, despite this, she tells me that she was on allergy shots at some point.  The most been prior to 2008, since that is the oldest note that we have.  She denies  having a history of asthma, but she does have an albuterol nebulizer at home.  She has used it only 1 or 2 times in the last year.  She actually had multiple albuterol vials become outdated.  She is not interested in looking into Sabrina Morrison asthma anymore.  She has a history of fibromyalgia diagnosed in 2014.  She has a multitude of other complaints, all of which are not related to allergy and asthma but she attributes to Sabrina Morrison "fibro".   Otherwise, there is no history of other atopic diseases, including food allergies, drug allergies, stinging insect allergies, eczema or urticaria. There is no significant infectious history. Vaccinations are up to date.    Past Medical History: Patient Active Problem List   Diagnosis Date Noted  . Mild intermittent asthma, uncomplicated 52/84/1324  . Chronic rhinitis 04/23/2018  . Anemia 08/11/2014  . Occlusion and stenosis of carotid artery without mention of cerebral infarction 11/10/2012  . IDA (iron deficiency anemia) 05/25/2012  . HIATAL HERNIA 04/05/2010  . GASTROPARESIS 09/20/2008  . HEADACHE 09/20/2008  . HOARSENESS 09/20/2008  . NAUSEA 09/20/2008  . Esophageal dysphagia 09/20/2008  . DIARRHEA 09/20/2008  . EPIGASTRIC PAIN 09/20/2008  . SCHATZKI'S RING, HX OF 09/20/2008  . HYPOTHYROIDISM 03/08/2007  . ALLERGIC RHINITIS 03/08/2007  . ASTHMA 03/08/2007  . GERD 03/08/2007    Medication List:  Allergies as of 04/23/2018      Reactions   Codeine Nausea And Vomiting   Coumadin [warfarin]    Dexlansoprazole    REACTION: chest tightness, muscle aches, but tolerates prevacid   Erythromycin Ethylsuccinate    REACTION: left sided weakness   Esomeprazole Magnesium Nausea And Vomiting   Moxifloxacin Nausea And Vomiting   Omeprazole    REACTION: muscle ache/chest tightness   Sulfonamide Derivatives Nausea And Vomiting      Medication List        Accurate as of 04/23/18  4:03 PM. Always use your most recent med list.          acetaminophen 500  MG tablet Commonly known as:  TYLENOL Take 500 mg by mouth every 6 (six) hours as needed. pain   albuterol 108 (90 Base) MCG/ACT inhaler Commonly known as:  PROVENTIL HFA;VENTOLIN HFA Inhale into the lungs every 6 (six) hours as needed for wheezing or shortness of breath (using 1-3 times per week as needed).   azelastine 0.1 % nasal spray Commonly known as:  ASTELIN Place 2 sprays into both nostrils 2 (two) times daily. Use in each nostril as directed   bismuth subsalicylate 401 MG chewable tablet Commonly known as:  PEPTO BISMOL Chew 524 mg by mouth as needed.   CARAFATE 1 GM/10ML suspension Generic drug:  sucralfate Take 1 g by mouth as needed.   cefUROXime 250 MG tablet Commonly known as:  CEFTIN   cetirizine 10 MG tablet Commonly known as:  ZYRTEC Take 1 tablet (10 mg total) by mouth daily.  Cranberry 500 MG Caps Take 500 mg by mouth daily.   B-12 COMPLIANCE INJECTION IJ Inject as directed.   cyanocobalamin 1000 MCG/ML injection Commonly known as:  (VITAMIN B-12) Inject 1,000 mcg into the muscle every 14 (fourteen) days.   diphenoxylate-atropine 2.5-0.025 MG tablet Commonly known as:  LOMOTIL Take 1 tablet by mouth 4 (four) times daily as needed. diarrhea   fluticasone 50 MCG/ACT nasal spray Commonly known as:  FLONASE Place 2 sprays into the nose daily as needed. Congestion   GAS-X PO Take 180 mg by mouth 4 (four) times daily as needed.   ipratropium 0.03 % nasal spray Commonly known as:  ATROVENT Place 1 spray into both nostrils every 6 (six) hours as needed for rhinitis.   ipratropium-albuterol 0.5-2.5 (3) MG/3ML Soln Commonly known as:  DUONEB   levothyroxine 100 MCG tablet Commonly known as:  SYNTHROID, LEVOTHROID Take 100 mcg by mouth daily before breakfast.   mometasone-formoterol 200-5 MCG/ACT Aero Commonly known as:  DULERA Inhale into the lungs.   omeprazole 20 MG capsule Commonly known as:  PRILOSEC Take 2 capsules by mouth daily.     prochlorperazine 5 MG tablet Commonly known as:  COMPAZINE Take 5 mg by mouth every 6 (six) hours as needed. Nausea,vomiting   QVAR 80 MCG/ACT inhaler Generic drug:  beclomethasone Inhale into the lungs.   traMADol 50 MG tablet Commonly known as:  ULTRAM Take 50 mg by mouth as needed.   traZODone 50 MG tablet Commonly known as:  DESYREL       Birth History: non-contributory  Developmental History: non-contributory.   Past Surgical History: Past Surgical History:  Procedure Laterality Date  . ABDOMINAL HYSTERECTOMY    . APPENDECTOMY    . ARM SKIN LESION BIOPSY / EXCISION     left arm  . CATARACT EXTRACTION W/PHACO  08/27/2011   Procedure: CATARACT EXTRACTION PHACO AND INTRAOCULAR LENS PLACEMENT (IOC);  Surgeon: Tonny Branch, MD;  Location: [facility];  Service: Ophthalmology;  Laterality: Right;  CDE: 15.12  . COLON SURGERY    . COLONOSCOPY  09/01/2004   RMR: normal, due 08/2014  . COLONOSCOPY WITH ESOPHAGOGASTRODUODENOSCOPY (EGD)  06/05/2012   Dr. Gala Romney- EGD- "corkscrewed" tubular esophagus.  hiatal hernia beign bx. TCS- colonic divertiulosis, tubular adenoma, hyperplastic polyp  . DILATION AND CURETTAGE OF UTERUS     x2  . ESOPHAGOGASTRODUODENOSCOPY  8/08   RMR: cork-screwed esophagus,prominent Schatki's ring large hh  . ESOPHAGOGASTRODUODENOSCOPY  04/18/10   prominent schatzki's ring/large hiatal hernia  otherwise normal stomach  . ESOPHAGOGASTRODUODENOSCOPY   09/25/2002   RMR: Prominent Schatski's ring, foreshortened esophagus, moderate size hiatal hernia.  The remainder of the stomach and duodenum through the second portion appeared normal.  Status post balloon dilation of ring,  . ESOPHAGOGASTRODUODENOSCOPY  01/28/2004   LDJ:[id number] ring status post dilation as described above. Otherwise normal/ Large hiatal hernia  . ESOPHAGOGASTRODUODENOSCOPY  09/01/2004   RMR: Schatzki's ring, otherwise normal esophagus status post dilation/Moderately large hiatal hernia  .  ESOPHAGOGASTRODUODENOSCOPY   12/14/2005   RMR: Schatzki's ring, otherwise normal esophagus status post dilation /Large hiatal hernia  . EYE SURGERY    . GIVENS CAPSULE STUDY  06/26/2012   Dr. Gala Romney- probable 2 superficial erosions in the mid small bowel, no evidence of tumor or blood anywhere in the small intestine.  . S/P Hysterectomy    . segmental colectomy     with incidental appendectomy in [place]  . YAG LASER APPLICATION Bilateral 0/30/0923   Procedure: YAG LASER APPLICATION;  Surgeon: Williams Che, MD;  Location: [facility];  Service: Ophthalmology;  Laterality: Bilateral;     Family History: Family History  Problem Relation Age of Onset  . Heart attack Father 63       deceased  . Colon cancer Neg Hx   . Anesthesia problems Neg Hx   . Hypotension Neg Hx   . Malignant hyperthermia Neg Hx   . Pseudochol deficiency Neg Hx      Social History: Trenae lives at home.  She lives in a house that was built in [place].  There is wood throughout the home.  She has gas and electric heating with central cooling.  There are no animals inside or outside of the home.  There are no dust mite covers.  There is no tobacco exposure.  She is currently retired.  Sabrina Morrison husband died in the 31s.  He was a Emergency planning/management officer and was killed in the line of duty.     Review of Systems: a 14-point review of systems is pertinent for what is mentioned in HPI.  Otherwise, all other systems were negative. Constitutional: negative other than that listed in the HPI Eyes: negative other than that listed in the HPI Ears, nose, mouth, throat, and face: negative other than that listed in the HPI Respiratory: negative other than that listed in the HPI Cardiovascular: negative other than that listed in the HPI Gastrointestinal: negative other than that listed in the HPI Genitourinary: negative other than that listed in the HPI Integument: negative other than that listed in the HPI Hematologic: negative other than that  listed in the HPI Musculoskeletal: negative other than that listed in the HPI Neurological: negative other than that listed in the HPI Allergy/Immunologic: negative other than that listed in the HPI    Objective:   Blood pressure 122/74, pulse 65, temperature 97.9 F (36.6 C), temperature source Oral, resp. rate 14, height 5\' 2"  (1.575 m), weight 143 lb (64.9 kg), SpO2 93 %. Body mass index is 26.16 kg/m.   Physical Exam:  General: Alert, interactive, in no acute distress.  Pleasant talkative female.  Difficult to get a word in edge wise. Eyes: No conjunctival injection bilaterally, no discharge on the right, no discharge on the left and no Horner-Trantas dots present. PERRL bilaterally. EOMI without pain. No photophobia.  Ears: Right TM pearly gray with normal light reflex, Left TM pearly gray with normal light reflex, Right TM intact without perforation and Left TM intact without perforation.  Nose/Throat: External nose within normal limits and septum midline. Turbinates edematous with clear discharge. Posterior oropharynx erythematous without cobblestoning in the posterior oropharynx. Tonsils 2+ without exudates.  Tongue without thrush. Neck: Supple without thyromegaly. Trachea midline. Adenopathy: no enlarged lymph nodes appreciated in the anterior cervical, occipital, axillary, epitrochlear, inguinal, or popliteal regions. Lungs: Decreased breath sounds with expiratory wheezing bilaterally. No increased work of breathing. CV: Normal S1/S2. No murmurs. Capillary refill <2 seconds.  Abdomen: Nondistended, nontender. No guarding or rebound tenderness. Bowel sounds present in all fields and hyperactive  Skin: Warm and dry, without lesions or rashes. Extremities:  No clubbing, cyanosis or edema. Neuro:   Grossly intact. No focal deficits appreciated. Responsive to questions.  Diagnostic studies:   Spirometry: results normal (FEV1: 1.74, FVC: 1.91, FEV1/FVC: 91).    Spirometry  uninterpretable due to technique.   Allergy Studies: none (blood work sent instead)       Salvatore Marvel, MD [facility] [place]

## 2018-04-24 DIAGNOSIS — J310 Chronic rhinitis: Secondary | ICD-10-CM | POA: Diagnosis not present

## 2018-04-25 DIAGNOSIS — N390 Urinary tract infection, site not specified: Secondary | ICD-10-CM | POA: Diagnosis not present

## 2018-04-25 DIAGNOSIS — E539 Vitamin B deficiency, unspecified: Secondary | ICD-10-CM | POA: Diagnosis not present

## 2018-04-25 DIAGNOSIS — R300 Dysuria: Secondary | ICD-10-CM | POA: Diagnosis not present

## 2018-04-27 LAB — IGE+ALLERGENS ZONE 2(30)
Amer Sycamore IgE Qn: <0.1 kU/L
Aspergillus Fumigatus IgE: <0.1 kU/L
Cladosporium Herbarum IgE: <0.1 kU/L
Cockroach, American IgE: <0.1 kU/L
Common Silver Birch IgE: <0.1 kU/L
D Pteronyssinus IgE: <0.1 kU/L
Elm, American IgE: <0.1 kU/L
IgE (Immunoglobulin E), Serum: 29 IU/mL (ref 6–495)
Johnson Grass IgE: <0.1 kU/L
Maple/Box Elder IgE: <0.1 kU/L
Mugwort IgE Qn: <0.1 kU/L
Pigweed, Rough IgE: <0.1 kU/L
Ragweed, Short IgE: <0.1 kU/L
Sheep Sorrel IgE Qn: <0.1 kU/L
Stemphylium Herbarum IgE: <0.1 kU/L
Timothy Grass IgE: <0.1 kU/L

## 2018-04-29 ENCOUNTER — Ambulatory Visit: Payer: Medicare HMO | Admitting: Gastroenterology

## 2018-05-02 DIAGNOSIS — N3000 Acute cystitis without hematuria: Secondary | ICD-10-CM | POA: Diagnosis not present

## 2018-05-02 DIAGNOSIS — Z6825 Body mass index (BMI) 25.0-25.9, adult: Secondary | ICD-10-CM | POA: Diagnosis not present

## 2018-05-03 DIAGNOSIS — K219 Gastro-esophageal reflux disease without esophagitis: Secondary | ICD-10-CM | POA: Diagnosis not present

## 2018-05-03 DIAGNOSIS — K580 Irritable bowel syndrome with diarrhea: Secondary | ICD-10-CM | POA: Diagnosis not present

## 2018-05-03 DIAGNOSIS — E039 Hypothyroidism, unspecified: Secondary | ICD-10-CM | POA: Diagnosis not present

## 2018-05-03 DIAGNOSIS — N3281 Overactive bladder: Secondary | ICD-10-CM | POA: Diagnosis not present

## 2018-05-09 DIAGNOSIS — D519 Vitamin B12 deficiency anemia, unspecified: Secondary | ICD-10-CM | POA: Diagnosis not present

## 2018-05-12 ENCOUNTER — Telehealth: Payer: Self-pay | Admitting: Allergy & Immunology

## 2018-05-12 NOTE — Telephone Encounter (Signed)
Called patient and reviewed lab results with the patient. Patient verbalized understanding.

## 2018-05-12 NOTE — Telephone Encounter (Signed)
Returning nurse call [phone].

## 2018-05-14 ENCOUNTER — Ambulatory Visit: Payer: Medicare HMO | Admitting: Urology

## 2018-05-14 DIAGNOSIS — N3941 Urge incontinence: Secondary | ICD-10-CM | POA: Diagnosis not present

## 2018-05-14 DIAGNOSIS — N3021 Other chronic cystitis with hematuria: Secondary | ICD-10-CM

## 2018-05-21 ENCOUNTER — Ambulatory Visit: Payer: Medicare HMO | Admitting: Allergy & Immunology

## 2018-05-22 DIAGNOSIS — D510 Vitamin B12 deficiency anemia due to intrinsic factor deficiency: Secondary | ICD-10-CM | POA: Diagnosis not present

## 2018-06-06 DIAGNOSIS — D519 Vitamin B12 deficiency anemia, unspecified: Secondary | ICD-10-CM | POA: Diagnosis not present

## 2018-06-11 ENCOUNTER — Encounter: Payer: Self-pay | Admitting: Allergy & Immunology

## 2018-06-11 ENCOUNTER — Ambulatory Visit: Payer: Medicare HMO | Admitting: Allergy & Immunology

## 2018-06-11 VITALS — BP 108/72 | HR 60 | Resp 14

## 2018-06-11 DIAGNOSIS — J310 Chronic rhinitis: Secondary | ICD-10-CM | POA: Diagnosis not present

## 2018-06-11 DIAGNOSIS — J4530 Mild persistent asthma, uncomplicated: Secondary | ICD-10-CM

## 2018-06-11 NOTE — Progress Notes (Signed)
FOLLOW UP  Date of Service/Encounter:  06/11/18   Assessment:   Mild persistent asthma without complication  Chronic rhinitis   Ms. [name] presents with continued rhinorrhea, although she does note that the Astelin and nasal ipratropium seem to do the best at controlling this.  She is not maximizing the use of her nasal ipratropium, so I recommended that she use this up to 4 times daily as needed.  In addition, she does have bilateral wheezing on exam.  She is not in distress, but she does feel somewhat tight feels that she has needed her nebulizer slightly more since last visit.  Given the wheezing, we are going to add a daily controller medication.  I am not sure what is covered with her Medicare, but we will start with Asmanex 220 mcg 1 puff twice daily.  We did provide samples.  Given her past history of not using medications, I doubt that she will stick with this regimen.  However, we will see when she follows up.    Plan/Recommendations:   1. Moderate intermittent asthma, uncomplicated - Lung testing looked slightly worse today and you sounded squeaky.  - Therefore I think that we need to start a daily medication for asthma: Asmanex 253mcg one puff twice daily. - Daily controller medication(s): Asmanex Twisthaler 21mcg 1 puffs twice daily - Rescue medications: albuterol nebulizer one vial every 4-6 hours as needed - Asthma control goals:  * Full participation in all desired activities (may need albuterol before activity) * Albuterol use two time or less a week on average (not counting use with activity) * Cough interfering with sleep two time or less a month * Oral steroids no more than once a year * No hospitalizations  2. Chronic non-allergic rhinitis - We will get some blood testing to see what might show up. - Stop taking: Flonase (fluticasone) and the Zyrtec (cetirizine) - Continue with: Astelin (azelastine) 2 sprays per nostril 1-2 times daily and Atrovent  (ipratropium) nasal spray 1 spray per nostril every 6 hours as needed for runny nose (FOUR TIMES A DAY) - Add on: Xyzal (levocetirizine) 5mg  daily   3. Return in about 6 months (around 12/10/2018).   Subjective:   Sabrina Morrison is a 85 y.o. female presenting today for follow up of  Chief Complaint  Patient presents with  . Asthma    Sabrina Morrison has a history of the following: Patient Active Problem List   Diagnosis Date Noted  . Mild intermittent asthma, uncomplicated 37/03/6268  . Chronic rhinitis 04/23/2018  . Anemia 08/11/2014  . Occlusion and stenosis of carotid artery without mention of cerebral infarction 11/10/2012  . IDA (iron deficiency anemia) 05/25/2012  . HIATAL HERNIA 04/05/2010  . GASTROPARESIS 09/20/2008  . HEADACHE 09/20/2008  . HOARSENESS 09/20/2008  . NAUSEA 09/20/2008  . Esophageal dysphagia 09/20/2008  . DIARRHEA 09/20/2008  . EPIGASTRIC PAIN 09/20/2008  . SCHATZKI'S RING, HX OF 09/20/2008  . HYPOTHYROIDISM 03/08/2007  . ALLERGIC RHINITIS 03/08/2007  . ASTHMA 03/08/2007  . GERD 03/08/2007    History obtained from: chart review and patient.  [place] Primary Care Provider is Caryl Bis, MD.     Sabrina Morrison is a 85 y.o. female presenting for a follow up visit. She was last seen in November 2019 as a New Patient evaluation. At that time, lung testing looked great. We decided to address her postnasal drip since the last visit, before adding on an asthma medication. We did get blood work  to see what might show up. We stopped her loratadine and started on cetirizine as well as Astelin nasal spray. We also added on nasal ipratropium one spray per nostril every 6 hours as needed.   She was recently seen by a Urologist and placed on a medication.  It was also recommended that she start a probiotic by her gastroenterologist.  However, on the day that she took both of these medications, she was very sleepy and fell asleep after church, awakening the  next morning.  She decided to stop the probiotic.  She has had no further problems from this perspective.  Asthma/Respiratory Symptom History: She does have an albuterol nebulizer which she I susing once per week or twice per month. It all depends on how she is "breathing and feeling". She is coughing at night.  She does feel slightly more tight than she did last time.  However, despite this, her ACT score is only 15.  I think that there is a disconnect between her actual symptoms and perceived symptoms.  Allergic Rhinitis Symptom History: She sneezes more in the bed than any other place at all.  She remains on all of her nasal sprays as well as the cetirizine.  She is interested in something "stronger" than the cetirizine.  She has not been on Xyzal in the past.  She has not needed antibiotics since last visit.  Otherwise, there have been no changes to her past medical history, surgical history, family history, or social history.    Review of Systems: a 14-point review of systems is pertinent for what is mentioned in HPI.  Otherwise, all other systems were negative.  Constitutional: negative other than that listed in the HPI Eyes: negative other than that listed in the HPI Ears, nose, mouth, throat, and face: negative other than that listed in the HPI Respiratory: negative other than that listed in the HPI Cardiovascular: negative other than that listed in the HPI Gastrointestinal: negative other than that listed in the HPI Genitourinary: negative other than that listed in the HPI Integument: negative other than that listed in the HPI Hematologic: negative other than that listed in the HPI Musculoskeletal: negative other than that listed in the HPI Neurological: negative other than that listed in the HPI Allergy/Immunologic: negative other than that listed in the HPI    Objective:   Blood pressure 108/72, pulse 60, resp. rate 14, SpO2 91 %. There is no height or weight on file to  calculate BMI.   Physical Exam:  General: Alert, interactive, in no acute distress. Talkative.   Eyes: No conjunctival injection bilaterally, no discharge on the right, no discharge on the left and no Horner-Trantas dots present. PERRL bilaterally. EOMI without pain. No photophobia.  Ears: Right TM pearly gray with normal light reflex, Left TM pearly gray with normal light reflex, Right TM intact without perforation and Left TM intact without perforation.  Nose/Throat: External nose within normal limits, nasal crease present and septum midline. Turbinates edematous with clear discharge. Posterior oropharynx erythematous without cobblestoning in the posterior oropharynx. Tonsils 2+ without exudates.  Tongue without thrush. Lungs: Mildly decreased breath sounds with expiratory wheezing bilaterally. No increased work of breathing. CV: Normal S1/S2. No murmurs. Capillary refill <2 seconds.  Skin: Warm and dry, without lesions or rashes. Neuro:   Grossly intact. No focal deficits appreciated. Responsive to questions.  Diagnostic studies:   Spirometry: results abnormal (FEV1: 1.06 L%, FVC: 1.33 L%, FEV1/FVC: 79%). Overall values are slightly lower than those obtained  at the last visit.     Allergy Studies: none       Salvatore Marvel, MD  [facility] [place]

## 2018-06-11 NOTE — Patient Instructions (Addendum)
1. Moderate intermittent asthma, uncomplicated - Lung testing looked slightly worse today and you sounded squeaky.  - Therefore I think that we need to start a daily medication for asthma: Asmanex 253mcg one puff twice daily. - Daily controller medication(s): Asmanex Twisthaler 298mcg 1 puffs twice daily - Rescue medications: albuterol nebulizer one vial every 4-6 hours as needed - Asthma control goals:  * Full participation in all desired activities (may need albuterol before activity) * Albuterol use two time or less a week on average (not counting use with activity) * Cough interfering with sleep two time or less a month * Oral steroids no more than once a year * No hospitalizations  2. Chronic non-allergic rhinitis - We will get some blood testing to see what might show up. - Stop taking: Flonase (fluticasone) and the Zyrtec (cetirizine) - Continue with: Astelin (azelastine) 2 sprays per nostril 1-2 times daily and Atrovent (ipratropium) nasal spray 1 spray per nostril every 6 hours as needed for runny nose (FOUR TIMES A DAY) - Add on: Xyzal (levocetirizine) 5mg  daily   3. Return in about 6 months (around 12/10/2018).   Please inform us of any Emergency Department visits, hospitalizations, or changes in symptoms. Call us before going to the ED for breathing or allergy symptoms since we might be able to fit you in for a sick visit. Feel free to contact us anytime with any questions, problems, or concerns.  It was a pleasure to see you again today!  Websites that have reliable patient information: 1. American Academy of Asthma, Allergy, and Immunology: www.aaaai.org 2. Food Allergy Research and Education (FARE): foodallergy.org 3. Mothers of Asthmatics: http://www.asthmacommunitynetwork.org 4. American College of Allergy, Asthma, and Immunology: MonthlyElectricBill.co.uk   Make sure you are registered to vote! If you have moved or changed any of your contact information, you will need to get this  updated before voting!

## 2018-06-18 ENCOUNTER — Ambulatory Visit: Payer: Medicare HMO | Admitting: Urology

## 2018-06-18 DIAGNOSIS — N3021 Other chronic cystitis with hematuria: Secondary | ICD-10-CM | POA: Diagnosis not present

## 2018-06-18 DIAGNOSIS — N3941 Urge incontinence: Secondary | ICD-10-CM | POA: Diagnosis not present

## 2018-06-19 DIAGNOSIS — D519 Vitamin B12 deficiency anemia, unspecified: Secondary | ICD-10-CM | POA: Diagnosis not present

## 2018-07-03 DIAGNOSIS — I10 Essential (primary) hypertension: Secondary | ICD-10-CM | POA: Diagnosis not present

## 2018-07-03 DIAGNOSIS — M797 Fibromyalgia: Secondary | ICD-10-CM | POA: Diagnosis not present

## 2018-07-03 DIAGNOSIS — E039 Hypothyroidism, unspecified: Secondary | ICD-10-CM | POA: Diagnosis not present

## 2018-07-03 DIAGNOSIS — D510 Vitamin B12 deficiency anemia due to intrinsic factor deficiency: Secondary | ICD-10-CM | POA: Diagnosis not present

## 2018-07-03 DIAGNOSIS — I358 Other nonrheumatic aortic valve disorders: Secondary | ICD-10-CM | POA: Diagnosis not present

## 2018-07-03 DIAGNOSIS — E782 Mixed hyperlipidemia: Secondary | ICD-10-CM | POA: Diagnosis not present

## 2018-07-03 DIAGNOSIS — F331 Major depressive disorder, recurrent, moderate: Secondary | ICD-10-CM | POA: Diagnosis not present

## 2018-07-07 DIAGNOSIS — Z6825 Body mass index (BMI) 25.0-25.9, adult: Secondary | ICD-10-CM | POA: Diagnosis not present

## 2018-07-07 DIAGNOSIS — Z0001 Encounter for general adult medical examination with abnormal findings: Secondary | ICD-10-CM | POA: Diagnosis not present

## 2018-07-07 DIAGNOSIS — I10 Essential (primary) hypertension: Secondary | ICD-10-CM | POA: Diagnosis not present

## 2018-07-14 DIAGNOSIS — Z6824 Body mass index (BMI) 24.0-24.9, adult: Secondary | ICD-10-CM | POA: Diagnosis not present

## 2018-07-14 DIAGNOSIS — R42 Dizziness and giddiness: Secondary | ICD-10-CM | POA: Diagnosis not present

## 2018-07-18 DIAGNOSIS — D519 Vitamin B12 deficiency anemia, unspecified: Secondary | ICD-10-CM | POA: Diagnosis not present

## 2018-07-21 NOTE — Progress Notes (Signed)
Referring Provider: Caryl Bis, MD Primary Care Physician:  Caryl Bis, MD Primary GI: Dr. Gala Romney   Chief Complaint  Patient presents with  . Gastroesophageal Reflux    have had it for years    HPI:   Sabrina Morrison is a 85 y.o. female presenting today with a history of chronic GERD, gastroparesis, history of IDA in the past s/p thorough evaluation. Due to vague dyspepsia at last visit, she was given Dexilant and asked to stop omeprazole. Last EGD in 2014.   Intermittent reflux in the morning. Takes Tums and cough syrup, which helps. States Dexilant may have helped but doesn't remember. Taking Carafate. GERD much better than past years. Taking omeprazole 40 mg daily. Went to steakhouse on [address] and food wouldn't go down. Thinks swallowed a big bite. No other issues. No abdominal pain. Takes Gas x.   Doesn't have appetite like used to. Weight 142 in Aug 2019, now 137 in Feb 2020. Highest weigh 170s in 2014/2015.   Past Medical History:  Diagnosis Date  . Allergic rhinitis   . Allergy   . Asthma   . Cancer ([place])    skin cancer form left arm  . Cataract   . Fibromyalgia   . Gastroparesis 08/2008   mild, 35% retention at 2 hours (normal is less than 30% at 2 hours)  . GERD (gastroesophageal reflux disease)   . Hiatal hernia 09/01/2004   large  . Hyperplastic colon polyp   . Hypothyroidism   . IDA (iron deficiency anemia)   . Irritable bowel syndrome   . Muscle pain, fibromyalgia   . PONV (postoperative nausea and vomiting)   . Schatzki's ring    h/o 8 EGDs  . Shortness of breath   . Tubular adenoma     Past Surgical History:  Procedure Laterality Date  . ABDOMINAL HYSTERECTOMY    . APPENDECTOMY    . ARM SKIN LESION BIOPSY / EXCISION     left arm  . CATARACT EXTRACTION W/PHACO  08/27/2011   Procedure: CATARACT EXTRACTION PHACO AND INTRAOCULAR LENS PLACEMENT (IOC);  Surgeon: Tonny Branch, MD;  Location: [facility];  Service: Ophthalmology;  Laterality: Right;   CDE: 15.12  . COLON SURGERY    . COLONOSCOPY  09/01/2004   RMR: normal, due 08/2014  . COLONOSCOPY WITH ESOPHAGOGASTRODUODENOSCOPY (EGD)  06/05/2012   Dr. Gala Romney- EGD- "corkscrewed" tubular esophagus.  hiatal hernia beign bx. TCS- colonic divertiulosis, tubular adenoma, hyperplastic polyp  . DILATION AND CURETTAGE OF UTERUS     x2  . ESOPHAGOGASTRODUODENOSCOPY  8/08   RMR: cork-screwed esophagus,prominent Schatki's ring large hh  . ESOPHAGOGASTRODUODENOSCOPY  04/18/10   prominent schatzki's ring/large hiatal hernia  otherwise normal stomach  . ESOPHAGOGASTRODUODENOSCOPY   09/25/2002   RMR: Prominent Schatski's ring, foreshortened esophagus, moderate size hiatal hernia.  The remainder of the stomach and duodenum through the second portion appeared normal.  Status post balloon dilation of ring,  . ESOPHAGOGASTRODUODENOSCOPY  01/28/2004   QJJ:HERDEYCX'K ring status post dilation as described above. Otherwise normal/ Large hiatal hernia  . ESOPHAGOGASTRODUODENOSCOPY  09/01/2004   RMR: Schatzki's ring, otherwise normal esophagus status post dilation/Moderately large hiatal hernia  . ESOPHAGOGASTRODUODENOSCOPY   12/14/2005   RMR: Schatzki's ring, otherwise normal esophagus status post dilation /Large hiatal hernia  . EYE SURGERY    . GIVENS CAPSULE STUDY  06/26/2012   Dr. Gala Romney- probable 2 superficial erosions in the mid small bowel, no evidence of tumor or blood  anywhere in the small intestine.  . S/P Hysterectomy    . segmental colectomy     with incidental appendectomy in [place]  . YAG LASER APPLICATION Bilateral 0/03/9322   Procedure: YAG LASER APPLICATION;  Surgeon: Williams Che, MD;  Location: [facility];  Service: Ophthalmology;  Laterality: Bilateral;    Current Outpatient Medications  Medication Sig Dispense Refill  . acetaminophen (TYLENOL) 500 MG tablet Take 500 mg by mouth every 6 (six) hours as needed. pain    . albuterol (PROVENTIL HFA;VENTOLIN HFA) 108 (90 BASE) MCG/ACT inhaler  Inhale into the lungs every 6 (six) hours as needed for wheezing or shortness of breath (using 1-3 times per week as needed).    Marland Kitchen azelastine (ASTELIN) 0.1 % nasal spray Place 2 sprays into both nostrils 2 (two) times daily. Use in each nostril as directed 30 mL 3  . bismuth subsalicylate (PEPTO BISMOL) 262 MG chewable tablet Chew 524 mg by mouth as needed.    Marland Kitchen CARAFATE 1 GM/10ML suspension Take 1 g by mouth as needed.     . Cranberry 500 MG CAPS Take 500 mg by mouth daily.    . cyanocobalamin (,VITAMIN B-12,) 1000 MCG/ML injection Inject 1,000 mcg into the muscle every 14 (fourteen) days.    . Cyanocobalamin (B-12 COMPLIANCE INJECTION IJ) Inject as directed.    . diphenoxylate-atropine (LOMOTIL) 2.5-0.025 MG per tablet Take 1 tablet by mouth 4 (four) times daily as needed. diarrhea    . fluticasone (FLONASE) 50 MCG/ACT nasal spray Place 2 sprays into the nose daily as needed. Congestion    . ipratropium (ATROVENT) 0.03 % nasal spray Place 1 spray into both nostrils every 6 (six) hours as needed for rhinitis. 30 mL 12  . levothyroxine (SYNTHROID, LEVOTHROID) 100 MCG tablet Take 100 mcg by mouth daily before breakfast.     . nitrofurantoin (MACRODANTIN) 50 MG capsule 50 mg at bedtime.    Marland Kitchen omeprazole (PRILOSEC) 20 MG capsule Take 2 capsules by mouth daily.    . Simethicone (GAS-X PO) Take 180 mg by mouth 4 (four) times daily as needed.     . traMADol (ULTRAM) 50 MG tablet Take 50 mg by mouth as needed.     . traZODone (DESYREL) 50 MG tablet     . MYRBETRIQ 25 MG TB24 tablet 25 mg daily.     No current facility-administered medications for this visit.     Allergies as of 07/23/2018 - Review Complete 07/23/2018  Allergen Reaction Noted  . Codeine Nausea And Vomiting   . Coumadin [warfarin]  06/19/2012  . Dexlansoprazole  04/05/2010  . Erythromycin ethylsuccinate  04/05/2010  . Esomeprazole magnesium Nausea And Vomiting   . Moxifloxacin Nausea And Vomiting 04/05/2010  . Omeprazole   04/05/2010  . Sulfonamide derivatives Nausea And Vomiting     Family History  Problem Relation Age of Onset  . Heart attack Father 93       deceased  . Colon cancer Neg Hx   . Anesthesia problems Neg Hx   . Hypotension Neg Hx   . Malignant hyperthermia Neg Hx   . Pseudochol deficiency Neg Hx     Social History   Socioeconomic History  . Marital status: Widowed    Spouse name: Not on file  . Number of children: 2  . Years of education: Not on file  . Highest education level: Not on file  Occupational History  . Occupation: taking computer classes  Social Needs  . Financial resource strain: Not  on file  . Food insecurity:    Worry: Not on file    Inability: Not on file  . Transportation needs:    Medical: Not on file    Non-medical: Not on file  Tobacco Use  . Smoking status: Never Smoker  . Smokeless tobacco: Never Used  Substance and Sexual Activity  . Alcohol use: No  . Drug use: No  . Sexual activity: Never    Birth control/protection: Post-menopausal  Lifestyle  . Physical activity:    Days per week: Not on file    Minutes per session: Not on file  . Stress: Not on file  Relationships  . Social connections:    Talks on phone: Not on file    Gets together: Not on file    Attends religious service: Not on file    Active member of club or organization: Not on file    Attends meetings of clubs or organizations: Not on file    Relationship status: Not on file  Other Topics Concern  . Not on file  Social History Narrative  . Not on file    Review of Systems: Gen: Denies fever, chills, anorexia. Denies fatigue, weakness, weight loss.  CV: Denies chest pain, palpitations, syncope, peripheral edema, and claudication. Resp: Denies dyspnea at rest, cough, wheezing, coughing up blood, and pleurisy. GI: see HPI Derm: Denies rash, itching, dry skin Psych: Denies depression, anxiety, memory loss, confusion. No homicidal or suicidal ideation.  Heme: Denies  bruising, bleeding, and enlarged lymph nodes.  Physical Exam: BP 138/77   Pulse 73   Temp 98.6 F (37 C) (Oral)   Ht 5\' 5"  (1.651 m)   Wt 137 lb (62.1 kg)   BMI 22.80 kg/m  General:   Alert and oriented. No distress noted. Pleasant and cooperative. Talkative Head:  Normocephalic and atraumatic. Eyes:  Conjuctiva clear without scleral icterus. Mouth:  Oral mucosa pink and moist.  Abdomen:  +BS, soft, non-tender and non-distended. No rebound or guarding. No HSM or masses noted. Msk:  Symmetrical without gross deformities. Normal posture. Extremities:  Without edema. Neurologic:  Alert and  oriented x4 Psych:  Alert and cooperative. Normal mood and affect.

## 2018-07-23 ENCOUNTER — Ambulatory Visit: Payer: Medicare HMO | Admitting: Gastroenterology

## 2018-07-23 ENCOUNTER — Encounter: Payer: Self-pay | Admitting: Gastroenterology

## 2018-07-23 VITALS — BP 138/77 | HR 73 | Temp 98.6°F | Ht 65.0 in | Wt 137.0 lb

## 2018-07-23 DIAGNOSIS — R634 Abnormal weight loss: Secondary | ICD-10-CM | POA: Insufficient documentation

## 2018-07-23 DIAGNOSIS — K219 Gastro-esophageal reflux disease without esophagitis: Secondary | ICD-10-CM

## 2018-07-23 NOTE — Patient Instructions (Signed)
Please continue omeprazole each morning, 30 minutes prior to breakfast.   I would like to see you back in 2 months!  I enjoyed seeing you again today! As you know, I value our relationship and want to provide genuine, compassionate, and quality care. I welcome your feedback. If you receive a survey regarding your visit,  I greatly appreciate you taking time to fill this out. See you next time!  Annitta Needs, PhD, ANP-BC [facility]

## 2018-07-28 NOTE — Assessment & Plan Note (Signed)
Weight 142 in Aug 2019, now 137 in Feb 2020. Highest weigh 170s in 2014/2015. No alarm signs/symptoms. Likely multifactorial in setting of age-related changes. May need imaging if persistent unintentional weight loss. Return in 2 months.

## 2018-07-28 NOTE — Assessment & Plan Note (Signed)
85 year old appearing younger than stated age, with omeprazole controlling GERD symptoms for most part. One episode of choking while at restaurant but seems to be related to larger food bolus. Will continue omeprazole and have her return in 2 months.

## 2018-07-29 NOTE — Progress Notes (Signed)
cc'ed to pcp °

## 2018-07-30 DIAGNOSIS — D519 Vitamin B12 deficiency anemia, unspecified: Secondary | ICD-10-CM | POA: Diagnosis not present

## 2018-08-01 DIAGNOSIS — J4540 Moderate persistent asthma, uncomplicated: Secondary | ICD-10-CM | POA: Diagnosis not present

## 2018-08-01 DIAGNOSIS — K210 Gastro-esophageal reflux disease with esophagitis: Secondary | ICD-10-CM | POA: Diagnosis not present

## 2018-08-12 DIAGNOSIS — M7062 Trochanteric bursitis, left hip: Secondary | ICD-10-CM | POA: Diagnosis not present

## 2018-08-12 DIAGNOSIS — M797 Fibromyalgia: Secondary | ICD-10-CM | POA: Diagnosis not present

## 2018-08-12 DIAGNOSIS — Z6825 Body mass index (BMI) 25.0-25.9, adult: Secondary | ICD-10-CM | POA: Diagnosis not present

## 2018-08-15 DIAGNOSIS — D519 Vitamin B12 deficiency anemia, unspecified: Secondary | ICD-10-CM | POA: Diagnosis not present

## 2018-08-28 DIAGNOSIS — N3281 Overactive bladder: Secondary | ICD-10-CM | POA: Diagnosis not present

## 2018-08-28 DIAGNOSIS — D510 Vitamin B12 deficiency anemia due to intrinsic factor deficiency: Secondary | ICD-10-CM | POA: Diagnosis not present

## 2018-08-28 DIAGNOSIS — M797 Fibromyalgia: Secondary | ICD-10-CM | POA: Diagnosis not present

## 2018-08-28 DIAGNOSIS — R350 Frequency of micturition: Secondary | ICD-10-CM | POA: Diagnosis not present

## 2018-08-28 DIAGNOSIS — Z6825 Body mass index (BMI) 25.0-25.9, adult: Secondary | ICD-10-CM | POA: Diagnosis not present

## 2018-09-01 DIAGNOSIS — E039 Hypothyroidism, unspecified: Secondary | ICD-10-CM | POA: Diagnosis not present

## 2018-09-01 DIAGNOSIS — E7800 Pure hypercholesterolemia, unspecified: Secondary | ICD-10-CM | POA: Diagnosis not present

## 2018-09-11 DIAGNOSIS — D519 Vitamin B12 deficiency anemia, unspecified: Secondary | ICD-10-CM | POA: Diagnosis not present

## 2018-09-11 DIAGNOSIS — R300 Dysuria: Secondary | ICD-10-CM | POA: Diagnosis not present

## 2018-09-25 DIAGNOSIS — D510 Vitamin B12 deficiency anemia due to intrinsic factor deficiency: Secondary | ICD-10-CM | POA: Diagnosis not present

## 2018-10-01 ENCOUNTER — Ambulatory Visit: Payer: Medicare HMO | Admitting: Gastroenterology

## 2018-10-02 DIAGNOSIS — E039 Hypothyroidism, unspecified: Secondary | ICD-10-CM | POA: Diagnosis not present

## 2018-10-02 DIAGNOSIS — E782 Mixed hyperlipidemia: Secondary | ICD-10-CM | POA: Diagnosis not present

## 2018-10-02 DIAGNOSIS — J449 Chronic obstructive pulmonary disease, unspecified: Secondary | ICD-10-CM | POA: Diagnosis not present

## 2018-10-03 DIAGNOSIS — R300 Dysuria: Secondary | ICD-10-CM | POA: Diagnosis not present

## 2018-10-10 DIAGNOSIS — E539 Vitamin B deficiency, unspecified: Secondary | ICD-10-CM | POA: Diagnosis not present

## 2018-10-24 DIAGNOSIS — D510 Vitamin B12 deficiency anemia due to intrinsic factor deficiency: Secondary | ICD-10-CM | POA: Diagnosis not present

## 2018-10-24 DIAGNOSIS — N3281 Overactive bladder: Secondary | ICD-10-CM | POA: Diagnosis not present

## 2018-10-30 DIAGNOSIS — E782 Mixed hyperlipidemia: Secondary | ICD-10-CM | POA: Diagnosis not present

## 2018-10-30 DIAGNOSIS — K210 Gastro-esophageal reflux disease with esophagitis: Secondary | ICD-10-CM | POA: Diagnosis not present

## 2018-10-30 DIAGNOSIS — E039 Hypothyroidism, unspecified: Secondary | ICD-10-CM | POA: Diagnosis not present

## 2018-10-30 DIAGNOSIS — M797 Fibromyalgia: Secondary | ICD-10-CM | POA: Diagnosis not present

## 2018-10-30 DIAGNOSIS — J449 Chronic obstructive pulmonary disease, unspecified: Secondary | ICD-10-CM | POA: Diagnosis not present

## 2018-10-30 DIAGNOSIS — N3091 Cystitis, unspecified with hematuria: Secondary | ICD-10-CM | POA: Diagnosis not present

## 2018-10-30 DIAGNOSIS — I10 Essential (primary) hypertension: Secondary | ICD-10-CM | POA: Diagnosis not present

## 2018-11-01 DIAGNOSIS — J449 Chronic obstructive pulmonary disease, unspecified: Secondary | ICD-10-CM | POA: Diagnosis not present

## 2018-11-01 DIAGNOSIS — E039 Hypothyroidism, unspecified: Secondary | ICD-10-CM | POA: Diagnosis not present

## 2018-11-01 DIAGNOSIS — I10 Essential (primary) hypertension: Secondary | ICD-10-CM | POA: Diagnosis not present

## 2018-11-04 DIAGNOSIS — K219 Gastro-esophageal reflux disease without esophagitis: Secondary | ICD-10-CM | POA: Diagnosis not present

## 2018-11-04 DIAGNOSIS — Z6825 Body mass index (BMI) 25.0-25.9, adult: Secondary | ICD-10-CM | POA: Diagnosis not present

## 2018-11-04 DIAGNOSIS — I10 Essential (primary) hypertension: Secondary | ICD-10-CM | POA: Diagnosis not present

## 2018-11-04 DIAGNOSIS — E782 Mixed hyperlipidemia: Secondary | ICD-10-CM | POA: Diagnosis not present

## 2018-11-04 DIAGNOSIS — M461 Sacroiliitis, not elsewhere classified: Secondary | ICD-10-CM | POA: Diagnosis not present

## 2018-11-04 DIAGNOSIS — E539 Vitamin B deficiency, unspecified: Secondary | ICD-10-CM | POA: Diagnosis not present

## 2018-11-04 DIAGNOSIS — I270 Primary pulmonary hypertension: Secondary | ICD-10-CM | POA: Diagnosis not present

## 2018-11-04 DIAGNOSIS — E039 Hypothyroidism, unspecified: Secondary | ICD-10-CM | POA: Diagnosis not present

## 2018-11-07 DIAGNOSIS — Z6825 Body mass index (BMI) 25.0-25.9, adult: Secondary | ICD-10-CM | POA: Diagnosis not present

## 2018-11-07 DIAGNOSIS — M7551 Bursitis of right shoulder: Secondary | ICD-10-CM | POA: Diagnosis not present

## 2018-11-07 DIAGNOSIS — E539 Vitamin B deficiency, unspecified: Secondary | ICD-10-CM | POA: Diagnosis not present

## 2018-11-21 ENCOUNTER — Ambulatory Visit: Payer: Medicare HMO | Admitting: Gastroenterology

## 2018-11-21 DIAGNOSIS — J309 Allergic rhinitis, unspecified: Secondary | ICD-10-CM | POA: Diagnosis not present

## 2018-11-21 DIAGNOSIS — N3281 Overactive bladder: Secondary | ICD-10-CM | POA: Diagnosis not present

## 2018-11-21 DIAGNOSIS — M797 Fibromyalgia: Secondary | ICD-10-CM | POA: Diagnosis not present

## 2018-11-21 DIAGNOSIS — E538 Deficiency of other specified B group vitamins: Secondary | ICD-10-CM | POA: Diagnosis not present

## 2018-11-21 DIAGNOSIS — Z6826 Body mass index (BMI) 26.0-26.9, adult: Secondary | ICD-10-CM | POA: Diagnosis not present

## 2018-11-21 DIAGNOSIS — D510 Vitamin B12 deficiency anemia due to intrinsic factor deficiency: Secondary | ICD-10-CM | POA: Diagnosis not present

## 2018-12-02 DIAGNOSIS — E039 Hypothyroidism, unspecified: Secondary | ICD-10-CM | POA: Diagnosis not present

## 2018-12-02 DIAGNOSIS — E782 Mixed hyperlipidemia: Secondary | ICD-10-CM | POA: Diagnosis not present

## 2018-12-02 DIAGNOSIS — J4540 Moderate persistent asthma, uncomplicated: Secondary | ICD-10-CM | POA: Diagnosis not present

## 2018-12-04 DIAGNOSIS — R197 Diarrhea, unspecified: Secondary | ICD-10-CM | POA: Diagnosis not present

## 2018-12-04 DIAGNOSIS — E538 Deficiency of other specified B group vitamins: Secondary | ICD-10-CM | POA: Diagnosis not present

## 2018-12-04 DIAGNOSIS — B351 Tinea unguium: Secondary | ICD-10-CM | POA: Diagnosis not present

## 2018-12-04 DIAGNOSIS — Z6825 Body mass index (BMI) 25.0-25.9, adult: Secondary | ICD-10-CM | POA: Diagnosis not present

## 2018-12-04 DIAGNOSIS — K219 Gastro-esophageal reflux disease without esophagitis: Secondary | ICD-10-CM | POA: Diagnosis not present

## 2018-12-12 ENCOUNTER — Ambulatory Visit: Payer: Medicare HMO | Admitting: Allergy & Immunology

## 2018-12-16 DIAGNOSIS — L603 Nail dystrophy: Secondary | ICD-10-CM | POA: Diagnosis not present

## 2018-12-16 DIAGNOSIS — L280 Lichen simplex chronicus: Secondary | ICD-10-CM | POA: Diagnosis not present

## 2018-12-19 DIAGNOSIS — D519 Vitamin B12 deficiency anemia, unspecified: Secondary | ICD-10-CM | POA: Diagnosis not present

## 2018-12-30 DIAGNOSIS — Z6825 Body mass index (BMI) 25.0-25.9, adult: Secondary | ICD-10-CM | POA: Diagnosis not present

## 2018-12-30 DIAGNOSIS — R197 Diarrhea, unspecified: Secondary | ICD-10-CM | POA: Diagnosis not present

## 2018-12-30 DIAGNOSIS — B351 Tinea unguium: Secondary | ICD-10-CM | POA: Diagnosis not present

## 2018-12-30 DIAGNOSIS — K219 Gastro-esophageal reflux disease without esophagitis: Secondary | ICD-10-CM | POA: Diagnosis not present

## 2018-12-30 DIAGNOSIS — E538 Deficiency of other specified B group vitamins: Secondary | ICD-10-CM | POA: Diagnosis not present

## 2019-01-02 DIAGNOSIS — D519 Vitamin B12 deficiency anemia, unspecified: Secondary | ICD-10-CM | POA: Diagnosis not present

## 2019-01-07 ENCOUNTER — Ambulatory Visit: Payer: Medicare HMO | Admitting: Allergy & Immunology

## 2019-01-09 DIAGNOSIS — M25512 Pain in left shoulder: Secondary | ICD-10-CM | POA: Diagnosis not present

## 2019-01-09 DIAGNOSIS — M797 Fibromyalgia: Secondary | ICD-10-CM | POA: Diagnosis not present

## 2019-01-09 DIAGNOSIS — Z6825 Body mass index (BMI) 25.0-25.9, adult: Secondary | ICD-10-CM | POA: Diagnosis not present

## 2019-01-14 DIAGNOSIS — I10 Essential (primary) hypertension: Secondary | ICD-10-CM | POA: Diagnosis not present

## 2019-01-14 DIAGNOSIS — Z6825 Body mass index (BMI) 25.0-25.9, adult: Secondary | ICD-10-CM | POA: Diagnosis not present

## 2019-01-14 DIAGNOSIS — M25511 Pain in right shoulder: Secondary | ICD-10-CM | POA: Diagnosis not present

## 2019-01-16 DIAGNOSIS — D510 Vitamin B12 deficiency anemia due to intrinsic factor deficiency: Secondary | ICD-10-CM | POA: Diagnosis not present

## 2019-01-21 ENCOUNTER — Other Ambulatory Visit (HOSPITAL_COMMUNITY)
Admission: RE | Admit: 2019-01-21 | Discharge: 2019-01-21 | Disposition: A | Discharge status: Home / Self Care | Payer: Medicare HMO | Source: Ambulatory Visit | Attending: Urology | Admitting: Urology

## 2019-01-21 ENCOUNTER — Ambulatory Visit: Payer: Medicare HMO | Admitting: Gastroenterology

## 2019-01-21 ENCOUNTER — Ambulatory Visit: Payer: Medicare HMO | Admitting: Urology

## 2019-01-21 DIAGNOSIS — N3021 Other chronic cystitis with hematuria: Secondary | ICD-10-CM

## 2019-01-21 DIAGNOSIS — N3941 Urge incontinence: Secondary | ICD-10-CM

## 2019-01-24 LAB — URINE CULTURE: Culture: >=100000 — AB

## 2019-01-26 ENCOUNTER — Ambulatory Visit: Payer: Medicare HMO | Admitting: Gastroenterology

## 2019-01-30 DIAGNOSIS — D510 Vitamin B12 deficiency anemia due to intrinsic factor deficiency: Secondary | ICD-10-CM | POA: Diagnosis not present

## 2019-02-02 DIAGNOSIS — J449 Chronic obstructive pulmonary disease, unspecified: Secondary | ICD-10-CM | POA: Diagnosis not present

## 2019-02-02 DIAGNOSIS — I10 Essential (primary) hypertension: Secondary | ICD-10-CM | POA: Diagnosis not present

## 2019-02-04 DIAGNOSIS — M461 Sacroiliitis, not elsewhere classified: Secondary | ICD-10-CM | POA: Diagnosis not present

## 2019-02-04 DIAGNOSIS — I10 Essential (primary) hypertension: Secondary | ICD-10-CM | POA: Diagnosis not present

## 2019-02-04 DIAGNOSIS — Z23 Encounter for immunization: Secondary | ICD-10-CM | POA: Diagnosis not present

## 2019-02-04 DIAGNOSIS — E539 Vitamin B deficiency, unspecified: Secondary | ICD-10-CM | POA: Diagnosis not present

## 2019-02-04 DIAGNOSIS — Z1331 Encounter for screening for depression: Secondary | ICD-10-CM | POA: Diagnosis not present

## 2019-02-04 DIAGNOSIS — I270 Primary pulmonary hypertension: Secondary | ICD-10-CM | POA: Diagnosis not present

## 2019-02-04 DIAGNOSIS — Z6826 Body mass index (BMI) 26.0-26.9, adult: Secondary | ICD-10-CM | POA: Diagnosis not present

## 2019-02-04 DIAGNOSIS — Z1389 Encounter for screening for other disorder: Secondary | ICD-10-CM | POA: Diagnosis not present

## 2019-02-11 ENCOUNTER — Ambulatory Visit: Payer: Medicare HMO | Admitting: Allergy & Immunology

## 2019-02-13 DIAGNOSIS — Z6826 Body mass index (BMI) 26.0-26.9, adult: Secondary | ICD-10-CM | POA: Diagnosis not present

## 2019-02-13 DIAGNOSIS — E538 Deficiency of other specified B group vitamins: Secondary | ICD-10-CM | POA: Diagnosis not present

## 2019-02-13 DIAGNOSIS — G4700 Insomnia, unspecified: Secondary | ICD-10-CM | POA: Diagnosis not present

## 2019-02-17 DIAGNOSIS — Z6826 Body mass index (BMI) 26.0-26.9, adult: Secondary | ICD-10-CM | POA: Diagnosis not present

## 2019-02-17 DIAGNOSIS — I10 Essential (primary) hypertension: Secondary | ICD-10-CM | POA: Diagnosis not present

## 2019-02-24 ENCOUNTER — Ambulatory Visit: Payer: Medicare HMO | Admitting: Gastroenterology

## 2019-02-25 ENCOUNTER — Encounter: Payer: Self-pay | Admitting: Allergy & Immunology

## 2019-02-25 ENCOUNTER — Ambulatory Visit: Payer: Medicare HMO | Admitting: Allergy & Immunology

## 2019-02-25 ENCOUNTER — Other Ambulatory Visit: Payer: Self-pay

## 2019-02-25 VITALS — BP 130/78 | HR 60 | Temp 98.3°F | Resp 16

## 2019-02-25 DIAGNOSIS — J4530 Mild persistent asthma, uncomplicated: Secondary | ICD-10-CM | POA: Diagnosis not present

## 2019-02-25 DIAGNOSIS — J310 Chronic rhinitis: Secondary | ICD-10-CM | POA: Diagnosis not present

## 2019-02-25 MED ORDER — MOMETASONE FUROATE 220 MCG/INH IN AEPB: 2.0000 | INHALATION_SPRAY | Freq: Every day | RESPIRATORY_TRACT | 5 refills | Status: DC

## 2019-02-25 MED ORDER — ALBUTEROL SULFATE HFA 108 (90 BASE) MCG/ACT IN AERS: INHALATION_SPRAY | RESPIRATORY_TRACT | 1 refills | Status: DC

## 2019-02-25 NOTE — Progress Notes (Signed)
FOLLOW UP  Date of Service/Encounter:  02/25/19   Assessment:   Mild persistent asthma without complication  Non-allergic rhinitis   Plan/Recommendations:   1. Moderate intermittent asthma, uncomplicated - Lung testing looked stable today.  - Daily controller medication(s): Asmanex Twisthaler 296mcg 1 puffs twice daily - Rescue medications: ipratropium nebulizer solution every 4 hours as needed - Asthma control goals:  * Full participation in all desired activities (may need albuterol before activity) * Albuterol use two time or less a week on average (not counting use with activity) * Cough interfering with sleep two time or less a month * Oral steroids no more than once a year * No hospitalizations  2. Chronic non-allergic rhinitis - Continue with: Astelin (azelastine) 2 sprays per nostril TWO TIMES daily and Atrovent (ipratropium) nasal spray 1 spray per nostril THREE TIMES DAILY - Continue with: Xyzal (levocetirizine) 5mg  daily (can take an extra one if needed on particularly bad days)  3. Return in about 3 months (around 05/27/2019).   Subjective:   Sabrina Morrison is a 85 y.o. female presenting today for follow up of  Chief Complaint  Patient presents with  . Asthma    she says that she has been coughing, sneezing and having some runny nose. Dr. Olena Heckle prescribed her some ipratropium bromide for a nebulizer last year.     Sabrina Morrison has a history of the following: Patient Active Problem List   Diagnosis Date Noted  . Loss of weight 07/23/2018  . Mild intermittent asthma, uncomplicated [id number]  . Chronic rhinitis 04/23/2018  . Anemia 08/11/2014  . Occlusion and stenosis of carotid artery without mention of cerebral infarction 11/10/2012  . IDA (iron deficiency anemia) 05/25/2012  . HIATAL HERNIA 04/05/2010  . GASTROPARESIS 09/20/2008  . HEADACHE 09/20/2008  . HOARSENESS 09/20/2008  . NAUSEA 09/20/2008  . Esophageal dysphagia 09/20/2008  .  DIARRHEA 09/20/2008  . EPIGASTRIC PAIN 09/20/2008  . SCHATZKI'S RING, HX OF 09/20/2008  . HYPOTHYROIDISM 03/08/2007  . ALLERGIC RHINITIS 03/08/2007  . ASTHMA 03/08/2007  . GERD 03/08/2007    History obtained from: chart review and patient.  Sabrina Morrison is a 85 y.o. female presenting for a follow up visit. She was last seen in January 2020 at which time her symptoms were not controlled. Rhinorrhea was her main complaint. We sent an environmental allergy panel which was negative. We recommended using Asmanex 272mcg one puff BID as well as albuterol as needed.  For her rhinitis, we stopped her fluticasone and cetirizine. Instead we added on Xyzal as well as Astelin and ipratropium. We made the recommends somewhat nebulous in order to give her flexibility on the dosing.   Since the last visit, she has been doing okay since her last visit but states she has run out of her inhalers and has had to use her nebulizer more frequently.   Her asthma had been doing okay on her Asmanex and PRN Albuterol, but she states she has been out of these for some time. She has been using her PRN ipratropium nebulizers (prescribed by her PCP) more frequently to compensate for this. She has been using the ipratropium 2-3 times per day. She has not needed prednisone or ED visits at all for her visits.   She continues to have rhinitis but has not been maximizing her nasal spray regimen. She has been taking her Xyzal daily but does not feel like this is helping as much. She has been using her Astelin and Atrovent only once  daily (she can use the a Atrovent up to 4 times per day).    We also discussed getting a pill organizer to help her manage her medications. She states she will do this and discus it with her PCP's office as well.  Otherwise, there have been no changes to her past medical history, surgical history, family history, or social history.    Review of Systems  Constitutional: Negative.  Negative for chills,  fever, malaise/fatigue and weight loss.  HENT: Negative.  Negative for congestion, ear discharge and ear pain.        Positive for marked rhinorrhea.  Eyes: Negative for pain, discharge and redness.  Respiratory: Negative for cough, sputum production, shortness of breath and wheezing.   Cardiovascular: Negative.  Negative for chest pain and palpitations.  Gastrointestinal: Negative for abdominal pain, constipation, diarrhea, heartburn, nausea and vomiting.  Skin: Negative.  Negative for itching and rash.  Neurological: Negative for dizziness and headaches.  Endo/Heme/Allergies: Negative for environmental allergies. Does not bruise/bleed easily.       Objective:   Blood pressure 130/78, pulse 60, temperature 98.3 F (36.8 C), temperature source Temporal, resp. rate 16, SpO2 97 %. There is no height or weight on file to calculate BMI.   Physical Exam:  Physical Exam  Constitutional: She appears well-developed.  HENT:  Head: Normocephalic and atraumatic.  Right Ear: Tympanic membrane and ear canal normal. There is drainage.  Left Ear: Tympanic membrane and ear canal normal. There is drainage.  Nose: No mucosal edema, rhinorrhea, nasal deformity or septal deviation. No epistaxis. Right sinus exhibits no maxillary sinus tenderness and no frontal sinus tenderness. Left sinus exhibits no maxillary sinus tenderness and no frontal sinus tenderness.  Mouth/Throat: Uvula is midline and oropharynx is clear and moist. Mucous membranes are not pale and not dry.  Cobblestoning present in the posterior oropharynx.  Eyes: Pupils are equal, round, and reactive to light. Conjunctivae and EOM are normal. Right eye exhibits no chemosis and no discharge. Left eye exhibits no chemosis and no discharge. Right conjunctiva is not injected. Left conjunctiva is not injected.  Cardiovascular: Normal rate, regular rhythm and normal heart sounds.  Respiratory: Effort normal and breath sounds normal. No accessory  muscle usage. No tachypnea. No respiratory distress. She has no wheezes. She has no rhonchi. She has no rales. She exhibits no tenderness.  Moving air well in all lung fields. No wheezing appreciated.   Lymphadenopathy:    She has no cervical adenopathy.  Neurological: She is alert.  Skin: No abrasion, no petechiae and no rash noted. Rash is not papular, not vesicular and not urticarial. No erythema. No pallor.  Psychiatric: She has a normal mood and affect.     Diagnostic studies:    Spirometry: results normal (FEV1: 1.23 L, FVC: 1.06 L, FEV1/FVC: 89%).    Spirometry consistent with normal pattern but the effort was poor. Overall values are similar to those obtained in January 2020.    Allergy Studies: none       Salvatore Marvel, MD  [facility]

## 2019-02-25 NOTE — Patient Instructions (Addendum)
1. Moderate intermittent asthma, uncomplicated - Lung testing looked stable today.  - Daily controller medication(s): Asmanex Twisthaler 252mcg 1 puffs twice daily - Rescue medications: ipratropium nebulizer solution every 4 hours as needed - Asthma control goals:  * Full participation in all desired activities (may need albuterol before activity) * Albuterol use two time or less a week on average (not counting use with activity) * Cough interfering with sleep two time or less a month * Oral steroids no more than once a year * No hospitalizations  2. Chronic non-allergic rhinitis - Continue with: Astelin (azelastine) 2 sprays per nostril TWO TIMES daily and Atrovent (ipratropium) nasal spray 1 spray per nostril THREE TIMES DAILY - Continue with: Xyzal (levocetirizine) 5mg  daily (can take an extra one if needed on particularly bad days)  3. Return in about 3 months (around 05/27/2019).   Please inform us of any Emergency Department visits, hospitalizations, or changes in symptoms. Call us before going to the ED for breathing or allergy symptoms since we might be able to fit you in for a sick visit. Feel free to contact us anytime with any questions, problems, or concerns.  It was a pleasure to see you again today!  Websites that have reliable patient information: 1. American Academy of Asthma, Allergy, and Immunology: www.aaaai.org 2. Food Allergy Research and Education (FARE): foodallergy.org 3. Mothers of Asthmatics: http://www.asthmacommunitynetwork.org 4. American College of Allergy, Asthma, and Immunology: MonthlyElectricBill.co.uk   Make sure you are registered to vote! If you have moved or changed any of your contact information, you will need to get this updated before voting!

## 2019-02-26 ENCOUNTER — Telehealth: Payer: Self-pay

## 2019-02-26 NOTE — Telephone Encounter (Signed)
Fax from [facility]:  Surveyor, quantity not covered by the insurance: Alternatives include:  Flovent Diskus, Flovent HFA, or Arnuity Ellipta Please advise on what to change to.

## 2019-02-27 DIAGNOSIS — Z6827 Body mass index (BMI) 27.0-27.9, adult: Secondary | ICD-10-CM | POA: Diagnosis not present

## 2019-02-27 DIAGNOSIS — E538 Deficiency of other specified B group vitamins: Secondary | ICD-10-CM | POA: Diagnosis not present

## 2019-02-27 DIAGNOSIS — I10 Essential (primary) hypertension: Secondary | ICD-10-CM | POA: Diagnosis not present

## 2019-02-27 MED ORDER — ARNUITY ELLIPTA 200 MCG/ACT IN AEPB: 1.0000 | INHALATION_SPRAY | Freq: Every day | RESPIRATORY_TRACT | 5 refills | Status: DC

## 2019-02-27 NOTE — Addendum Note (Signed)
Addended by: Horris Latino on: 02/27/2019 11:44 AM   Modules accepted: Orders

## 2019-02-27 NOTE — Telephone Encounter (Signed)
Left message for patient to call office to inform her of change in her inhaler. Script sent to pharmacy.

## 2019-02-27 NOTE — Telephone Encounter (Signed)
Informed patient of medication change. Patient voiced understanding.

## 2019-02-27 NOTE — Telephone Encounter (Signed)
Let's do Arnuity 223mcg one puff once daily.   Salvatore Marvel, MD [facility]

## 2019-03-02 DIAGNOSIS — Z6826 Body mass index (BMI) 26.0-26.9, adult: Secondary | ICD-10-CM | POA: Diagnosis not present

## 2019-03-02 DIAGNOSIS — N390 Urinary tract infection, site not specified: Secondary | ICD-10-CM | POA: Diagnosis not present

## 2019-03-09 ENCOUNTER — Encounter: Payer: Self-pay | Admitting: Gastroenterology

## 2019-03-09 ENCOUNTER — Other Ambulatory Visit: Payer: Self-pay

## 2019-03-09 ENCOUNTER — Ambulatory Visit: Payer: Medicare HMO | Admitting: Gastroenterology

## 2019-03-09 VITALS — BP 127/78 | HR 62 | Temp 96.6°F | Ht 65.0 in | Wt 143.6 lb

## 2019-03-09 DIAGNOSIS — K219 Gastro-esophageal reflux disease without esophagitis: Secondary | ICD-10-CM

## 2019-03-09 NOTE — Assessment & Plan Note (Signed)
Clinically doing well.  Continue pantoprazole 40 mg daily before breakfast.  Can use Carafate as needed for dyspepsia.  Fortunately her weight has improved.  She is back at baseline from 1 year ago.  We will have her continue to monitor for this.  We will see her back in 6 months or she can call sooner if needed.

## 2019-03-09 NOTE — Progress Notes (Signed)
Primary Care Physician: Caryl Bis, MD  Primary Gastroenterologist:  Garfield Cornea, MD   Chief Complaint  Patient presents with  . Gastroesophageal Reflux    HPI: Sabrina Morrison is a 85 y.o. female here for follow-up of GERD.  Last seen in February.  Currently on pantoprazole 40 mg daily.  Good control of reflux symptoms.  She has an history of vague dyspepsia and uses Carafate on occasion for this.  Sometimes uses Tums.  Both provide good relief.  Bowel movements have been good.  No melena or rectal bleeding.  Her weight is been stable.  Appetite is okay, not like it used to be.  Denies nausea or vomiting.  There was concern for weight loss at time of last office visit.  Fortunately her weight has rebounded.  She is the same she was 1 year ago.  No plans for future colonoscopies due to age unless she develops symptoms.   Current Outpatient Medications  Medication Sig Dispense Refill  . acetaminophen (TYLENOL) 500 MG tablet Take 500 mg by mouth every 6 (six) hours as needed. pain    . albuterol (VENTOLIN HFA) 108 (90 Base) MCG/ACT inhaler 2 puffs by mouth every 4-6 hours as needed. 18 g 1  . amLODipine (NORVASC) 2.5 MG tablet Take 2.5 mg by mouth daily.     . Ascorbic Acid (VITAMIN C) 500 MG CAPS Take by mouth daily.    . Calcium Carbonate (CALCIUM 600 PO) Take by mouth daily.    . calcium elemental as carbonate (BARIATRIC TUMS ULTRA) 400 MG chewable tablet Chew 3,000 mg by mouth as needed for heartburn.    Marland Kitchen CARAFATE 1 GM/10ML suspension Take 1 g by mouth as needed.     . cephALEXin (KEFLEX) 250 MG capsule Take 250 mg by mouth 3 (three) times daily.    . Cranberry 500 MG CAPS Take 500 mg by mouth daily.    . cyanocobalamin (,VITAMIN B-12,) 1000 MCG/ML injection Inject 1,000 mcg into the muscle every 14 (fourteen) days.    . Cyanocobalamin (B-12 COMPLIANCE INJECTION IJ) Inject as directed.    . diphenoxylate-atropine (LOMOTIL) 2.5-0.025 MG per tablet Take 1 tablet by  mouth 4 (four) times daily as needed. diarrhea    . fluticasone (FLONASE) 50 MCG/ACT nasal spray Place 2 sprays into the nose daily as needed. Congestion    . Fluticasone Furoate (ARNUITY ELLIPTA) 200 MCG/ACT AEPB Inhale 1 puff into the lungs daily. 30 each 5  . ipratropium (ATROVENT) 0.03 % nasal spray Place 1 spray into both nostrils every 6 (six) hours as needed for rhinitis. 30 mL 12  . levocetirizine (XYZAL) 5 MG tablet Take 5 mg by mouth every evening.    Marland Kitchen levothyroxine (SYNTHROID, LEVOTHROID) 100 MCG tablet Take 100 mcg by mouth daily before breakfast.     . Magnesium 250 MG TABS Take by mouth daily.    . mirtazapine (REMERON) 15 MG tablet Take 7.5 mg by mouth at bedtime.    Marland Kitchen MYRBETRIQ 25 MG TB24 tablet 25 mg daily.    . ondansetron (ZOFRAN) 8 MG tablet Take 4-8 mg by mouth every 8 (eight) hours as needed for nausea or vomiting.    . pantoprazole (PROTONIX) 40 MG tablet Take 40 mg by mouth daily.    . Simethicone (GAS-X PO) Take 180 mg by mouth 4 (four) times daily as needed.     . traMADol (ULTRAM) 50 MG tablet Take 50 mg by mouth as needed.  No current facility-administered medications for this visit.     Allergies as of 03/09/2019 - Review Complete 03/09/2019  Allergen Reaction Noted  . Codeine Nausea And Vomiting   . Coumadin [warfarin]  06/19/2012  . Dexlansoprazole  04/05/2010  . Erythromycin ethylsuccinate  04/05/2010  . Esomeprazole magnesium Nausea And Vomiting   . Moxifloxacin Nausea And Vomiting 04/05/2010  . Omeprazole  04/05/2010  . Sulfonamide derivatives Nausea And Vomiting     ROS:  General: Negative for anorexia, weight loss, fever, chills, fatigue, weakness. ENT: Negative for hoarseness, difficulty swallowing , nasal congestion. CV: Negative for chest pain, angina, palpitations, dyspnea on exertion, peripheral edema.  Respiratory: Negative for dyspnea at rest, dyspnea on exertion, cough, sputum, wheezing.  GI: See history of present illness. GU:   Negative for dysuria, hematuria, urinary incontinence, urinary frequency, nocturnal urination.  Endo: Negative for unusual weight change.    Physical Examination:   BP 127/78   Pulse 62   Temp (!) 96.6 F (35.9 C) (Temporal)   Ht 5\' 5"  (1.651 m)   Wt 143 lb 9.6 oz (65.1 kg)   BMI 23.90 kg/m   General: Well-nourished, well-developed in no acute distress.  Eyes: No icterus. Mouth: Oropharyngeal mucosa moist and pink , no lesions erythema or exudate. Lungs: Clear to auscultation bilaterally.  Heart: Regular rate and rhythm, no murmurs rubs or gallops.  Abdomen: Bowel sounds are normal, nontender, nondistended, no hepatosplenomegaly or masses, no abdominal bruits or hernia , no rebound or guarding.   Extremities: No lower extremity edema. No clubbing or deformities. Neuro: Alert and oriented x 4   Skin: Warm and dry, no jaundice.   Psych: Alert and cooperative, normal mood and affect.   Imaging Studies: No results found.

## 2019-03-09 NOTE — Patient Instructions (Addendum)
1. Continue your pantoprazole once a day for acid reflux. 2. Continue Carafate liquid up to four times a day as needed for abdominal pain, reflux.  3. We will see you back in six months or call sooner if you need anything.

## 2019-03-13 DIAGNOSIS — N390 Urinary tract infection, site not specified: Secondary | ICD-10-CM | POA: Diagnosis not present

## 2019-03-13 DIAGNOSIS — D519 Vitamin B12 deficiency anemia, unspecified: Secondary | ICD-10-CM | POA: Diagnosis not present

## 2019-03-16 DIAGNOSIS — Z6827 Body mass index (BMI) 27.0-27.9, adult: Secondary | ICD-10-CM | POA: Diagnosis not present

## 2019-03-16 DIAGNOSIS — M259 Joint disorder, unspecified: Secondary | ICD-10-CM | POA: Diagnosis not present

## 2019-03-18 DIAGNOSIS — R350 Frequency of micturition: Secondary | ICD-10-CM | POA: Diagnosis not present

## 2019-03-18 DIAGNOSIS — N393 Stress incontinence (female) (male): Secondary | ICD-10-CM | POA: Diagnosis not present

## 2019-03-27 DIAGNOSIS — D519 Vitamin B12 deficiency anemia, unspecified: Secondary | ICD-10-CM | POA: Diagnosis not present

## 2019-04-01 ENCOUNTER — Ambulatory Visit: Payer: Medicare HMO | Admitting: Urology

## 2019-04-01 DIAGNOSIS — N3021 Other chronic cystitis with hematuria: Secondary | ICD-10-CM | POA: Diagnosis not present

## 2019-04-01 DIAGNOSIS — N3941 Urge incontinence: Secondary | ICD-10-CM | POA: Diagnosis not present

## 2019-04-03 DIAGNOSIS — I10 Essential (primary) hypertension: Secondary | ICD-10-CM | POA: Diagnosis not present

## 2019-04-10 DIAGNOSIS — D519 Vitamin B12 deficiency anemia, unspecified: Secondary | ICD-10-CM | POA: Diagnosis not present

## 2019-04-24 DIAGNOSIS — E782 Mixed hyperlipidemia: Secondary | ICD-10-CM | POA: Diagnosis not present

## 2019-04-24 DIAGNOSIS — D519 Vitamin B12 deficiency anemia, unspecified: Secondary | ICD-10-CM | POA: Diagnosis not present

## 2019-04-24 DIAGNOSIS — K219 Gastro-esophageal reflux disease without esophagitis: Secondary | ICD-10-CM | POA: Diagnosis not present

## 2019-04-24 DIAGNOSIS — M797 Fibromyalgia: Secondary | ICD-10-CM | POA: Diagnosis not present

## 2019-04-24 DIAGNOSIS — E039 Hypothyroidism, unspecified: Secondary | ICD-10-CM | POA: Diagnosis not present

## 2019-04-24 DIAGNOSIS — I10 Essential (primary) hypertension: Secondary | ICD-10-CM | POA: Diagnosis not present

## 2019-05-04 DIAGNOSIS — N3281 Overactive bladder: Secondary | ICD-10-CM | POA: Diagnosis not present

## 2019-05-04 DIAGNOSIS — J4530 Mild persistent asthma, uncomplicated: Secondary | ICD-10-CM | POA: Diagnosis not present

## 2019-05-06 DIAGNOSIS — E539 Vitamin B deficiency, unspecified: Secondary | ICD-10-CM | POA: Diagnosis not present

## 2019-05-06 DIAGNOSIS — M461 Sacroiliitis, not elsewhere classified: Secondary | ICD-10-CM | POA: Diagnosis not present

## 2019-05-06 DIAGNOSIS — Z6826 Body mass index (BMI) 26.0-26.9, adult: Secondary | ICD-10-CM | POA: Diagnosis not present

## 2019-05-06 DIAGNOSIS — I270 Primary pulmonary hypertension: Secondary | ICD-10-CM | POA: Diagnosis not present

## 2019-05-06 DIAGNOSIS — K219 Gastro-esophageal reflux disease without esophagitis: Secondary | ICD-10-CM | POA: Diagnosis not present

## 2019-05-06 DIAGNOSIS — E782 Mixed hyperlipidemia: Secondary | ICD-10-CM | POA: Diagnosis not present

## 2019-05-06 DIAGNOSIS — I10 Essential (primary) hypertension: Secondary | ICD-10-CM | POA: Diagnosis not present

## 2019-05-06 DIAGNOSIS — E039 Hypothyroidism, unspecified: Secondary | ICD-10-CM | POA: Diagnosis not present

## 2019-05-08 DIAGNOSIS — D519 Vitamin B12 deficiency anemia, unspecified: Secondary | ICD-10-CM | POA: Diagnosis not present

## 2019-05-12 DIAGNOSIS — R300 Dysuria: Secondary | ICD-10-CM | POA: Diagnosis not present

## 2019-05-12 DIAGNOSIS — Z6826 Body mass index (BMI) 26.0-26.9, adult: Secondary | ICD-10-CM | POA: Diagnosis not present

## 2019-05-12 DIAGNOSIS — J45909 Unspecified asthma, uncomplicated: Secondary | ICD-10-CM | POA: Diagnosis not present

## 2019-05-12 DIAGNOSIS — N760 Acute vaginitis: Secondary | ICD-10-CM | POA: Diagnosis not present

## 2019-05-22 DIAGNOSIS — D510 Vitamin B12 deficiency anemia due to intrinsic factor deficiency: Secondary | ICD-10-CM | POA: Diagnosis not present

## 2019-05-26 ENCOUNTER — Encounter: Payer: Self-pay | Admitting: Urology

## 2019-05-27 ENCOUNTER — Ambulatory Visit: Payer: Medicare HMO | Admitting: Allergy & Immunology

## 2019-05-28 ENCOUNTER — Other Ambulatory Visit: Payer: Self-pay | Admitting: Allergy & Immunology

## 2019-06-01 DIAGNOSIS — I10 Essential (primary) hypertension: Secondary | ICD-10-CM | POA: Diagnosis not present

## 2019-06-04 ENCOUNTER — Other Ambulatory Visit: Payer: Self-pay

## 2019-06-04 DIAGNOSIS — D519 Vitamin B12 deficiency anemia, unspecified: Secondary | ICD-10-CM | POA: Diagnosis not present

## 2019-06-04 MED ORDER — AZELASTINE HCL 0.1 % NA SOLN: 2.0000 | Freq: Two times a day (BID) | NASAL | 1 refills | Status: DC

## 2019-06-04 MED ORDER — IPRATROPIUM BROMIDE 0.03 % NA SOLN: 1.0000 | Freq: Four times a day (QID) | NASAL | 1 refills | Status: DC | PRN

## 2019-06-04 NOTE — Telephone Encounter (Signed)
Patient called to see if we can do a refill until her appointment on 06/24/2019.  Please [place]

## 2019-06-04 NOTE — Telephone Encounter (Signed)
Spoke with patient. Patient made aware that medication refills were sent to [facility].

## 2019-06-10 DIAGNOSIS — Z6826 Body mass index (BMI) 26.0-26.9, adult: Secondary | ICD-10-CM | POA: Diagnosis not present

## 2019-06-10 DIAGNOSIS — K219 Gastro-esophageal reflux disease without esophagitis: Secondary | ICD-10-CM | POA: Diagnosis not present

## 2019-06-19 DIAGNOSIS — E539 Vitamin B deficiency, unspecified: Secondary | ICD-10-CM | POA: Diagnosis not present

## 2019-06-23 DIAGNOSIS — M545 Low back pain: Secondary | ICD-10-CM | POA: Diagnosis not present

## 2019-06-24 ENCOUNTER — Ambulatory Visit: Payer: Medicare HMO | Admitting: Allergy & Immunology

## 2019-07-01 DIAGNOSIS — M545 Low back pain: Secondary | ICD-10-CM | POA: Diagnosis not present

## 2019-07-01 DIAGNOSIS — M6281 Muscle weakness (generalized): Secondary | ICD-10-CM | POA: Diagnosis not present

## 2019-07-01 DIAGNOSIS — R262 Difficulty in walking, not elsewhere classified: Secondary | ICD-10-CM | POA: Diagnosis not present

## 2019-07-01 DIAGNOSIS — R2689 Other abnormalities of gait and mobility: Secondary | ICD-10-CM | POA: Diagnosis not present

## 2019-07-03 DIAGNOSIS — I10 Essential (primary) hypertension: Secondary | ICD-10-CM | POA: Diagnosis not present

## 2019-07-03 DIAGNOSIS — D519 Vitamin B12 deficiency anemia, unspecified: Secondary | ICD-10-CM | POA: Diagnosis not present

## 2019-07-03 DIAGNOSIS — K219 Gastro-esophageal reflux disease without esophagitis: Secondary | ICD-10-CM | POA: Diagnosis not present

## 2019-07-08 ENCOUNTER — Ambulatory Visit: Payer: Medicare HMO | Admitting: Allergy & Immunology

## 2019-07-10 DIAGNOSIS — Z0001 Encounter for general adult medical examination with abnormal findings: Secondary | ICD-10-CM | POA: Diagnosis not present

## 2019-07-15 ENCOUNTER — Ambulatory Visit: Payer: Medicare HMO | Admitting: Urology

## 2019-07-24 DIAGNOSIS — E538 Deficiency of other specified B group vitamins: Secondary | ICD-10-CM | POA: Diagnosis not present

## 2019-07-30 DIAGNOSIS — I10 Essential (primary) hypertension: Secondary | ICD-10-CM | POA: Diagnosis not present

## 2019-07-31 DIAGNOSIS — I10 Essential (primary) hypertension: Secondary | ICD-10-CM | POA: Diagnosis not present

## 2019-07-31 DIAGNOSIS — E7849 Other hyperlipidemia: Secondary | ICD-10-CM | POA: Diagnosis not present

## 2019-08-07 DIAGNOSIS — M797 Fibromyalgia: Secondary | ICD-10-CM | POA: Diagnosis not present

## 2019-08-07 DIAGNOSIS — G4700 Insomnia, unspecified: Secondary | ICD-10-CM | POA: Diagnosis not present

## 2019-08-07 DIAGNOSIS — Z6825 Body mass index (BMI) 25.0-25.9, adult: Secondary | ICD-10-CM | POA: Diagnosis not present

## 2019-08-11 DIAGNOSIS — E039 Hypothyroidism, unspecified: Secondary | ICD-10-CM | POA: Diagnosis not present

## 2019-08-12 ENCOUNTER — Ambulatory Visit: Payer: Medicare HMO | Admitting: Allergy & Immunology

## 2019-08-21 DIAGNOSIS — D510 Vitamin B12 deficiency anemia due to intrinsic factor deficiency: Secondary | ICD-10-CM | POA: Diagnosis not present

## 2019-08-21 DIAGNOSIS — E538 Deficiency of other specified B group vitamins: Secondary | ICD-10-CM | POA: Diagnosis not present

## 2019-08-25 DIAGNOSIS — I10 Essential (primary) hypertension: Secondary | ICD-10-CM | POA: Diagnosis not present

## 2019-08-25 DIAGNOSIS — M545 Low back pain: Secondary | ICD-10-CM | POA: Diagnosis not present

## 2019-08-25 DIAGNOSIS — K589 Irritable bowel syndrome without diarrhea: Secondary | ICD-10-CM | POA: Diagnosis not present

## 2019-08-25 DIAGNOSIS — I739 Peripheral vascular disease, unspecified: Secondary | ICD-10-CM | POA: Diagnosis not present

## 2019-08-25 DIAGNOSIS — Z6823 Body mass index (BMI) 23.0-23.9, adult: Secondary | ICD-10-CM | POA: Diagnosis not present

## 2019-08-25 DIAGNOSIS — J309 Allergic rhinitis, unspecified: Secondary | ICD-10-CM | POA: Diagnosis not present

## 2019-08-25 DIAGNOSIS — M40209 Unspecified kyphosis, site unspecified: Secondary | ICD-10-CM | POA: Diagnosis not present

## 2019-08-25 DIAGNOSIS — Z96649 Presence of unspecified artificial hip joint: Secondary | ICD-10-CM | POA: Diagnosis not present

## 2019-08-25 DIAGNOSIS — K219 Gastro-esophageal reflux disease without esophagitis: Secondary | ICD-10-CM | POA: Diagnosis not present

## 2019-08-25 DIAGNOSIS — E039 Hypothyroidism, unspecified: Secondary | ICD-10-CM | POA: Diagnosis not present

## 2019-08-25 DIAGNOSIS — G4700 Insomnia, unspecified: Secondary | ICD-10-CM | POA: Diagnosis not present

## 2019-09-02 ENCOUNTER — Other Ambulatory Visit: Payer: Self-pay

## 2019-09-02 ENCOUNTER — Encounter: Payer: Self-pay | Admitting: Allergy & Immunology

## 2019-09-02 ENCOUNTER — Telehealth: Payer: Self-pay | Admitting: *Deleted

## 2019-09-02 ENCOUNTER — Other Ambulatory Visit: Payer: Self-pay | Admitting: *Deleted

## 2019-09-02 ENCOUNTER — Ambulatory Visit: Payer: Medicare HMO | Admitting: Allergy & Immunology

## 2019-09-02 VITALS — BP 140/60 | HR 65 | Temp 98.1°F | Resp 14 | Ht 61.0 in | Wt 139.0 lb

## 2019-09-02 DIAGNOSIS — J310 Chronic rhinitis: Secondary | ICD-10-CM | POA: Diagnosis not present

## 2019-09-02 DIAGNOSIS — J4530 Mild persistent asthma, uncomplicated: Secondary | ICD-10-CM | POA: Diagnosis not present

## 2019-09-02 DIAGNOSIS — I10 Essential (primary) hypertension: Secondary | ICD-10-CM | POA: Diagnosis not present

## 2019-09-02 MED ORDER — ASMANEX (30 METERED DOSES) 220 MCG/INH IN AEPB: 1.0000 | INHALATION_SPRAY | Freq: Two times a day (BID) | RESPIRATORY_TRACT | 5 refills | Status: DC

## 2019-09-02 NOTE — Patient Instructions (Addendum)
1. Moderate intermittent asthma, uncomplicated - Lung testing looked stable today.  - We are going to try sending in Asmanex again to see if we can get that covered. - Stop the Arnuity. - Daily controller medication(s): Asmanex Twisthaler 257mcg 1 puffs twice daily - Rescue medications: albuterol nebulizer one vial every 4-6 hours as needed every 4 hours as needed - Asthma control goals:  * Full participation in all desired activities (may need albuterol before activity) * Albuterol use two time or less a week on average (not counting use with activity) * Cough interfering with sleep two time or less a month * Oral steroids no more than once a year * No hospitalizations  2. Non-allergic rhinitis - Continue with: Astelin (azelastine) 2 sprays per nostril TWO TIMES daily and Atrovent (ipratropium) nasal spray 1 spray per nostril THREE TIMES DAILY - Add on: RyVent 6mg  daily as needed for runny nose (samples provided)  3. Return in about 3 months (around 12/02/2019). This can be an in-person, a virtual Webex or a telephone follow up visit.   Please inform us of any Emergency Department visits, hospitalizations, or changes in symptoms. Call us before going to the ED for breathing or allergy symptoms since we might be able to fit you in for a sick visit. Feel free to contact us anytime with any questions, problems, or concerns.  It was a pleasure to see you again today!  Websites that have reliable patient information: 1. American Academy of Asthma, Allergy, and Immunology: www.aaaai.org 2. Food Allergy Research and Education (FARE): foodallergy.org 3. Mothers of Asthmatics: http://www.asthmacommunitynetwork.org 4. American College of Allergy, Asthma, and Immunology: www.acaai.org   COVID-19 Vaccine Information can be found at: ShippingScam.co.uk For questions related to vaccine distribution or appointments, please email  vaccine@[email or web address] or call [phone].     "Like" [place] on Facebook and Instagram for our latest updates!       HAPPY SPRING!  Make sure you are registered to vote! If you have moved or changed any of your contact information, you will need to get this updated before voting!  In some cases, you MAY be able to register to vote online: [email or web address]

## 2019-09-02 NOTE — Telephone Encounter (Signed)
Is there is a list of things she has to try and fail? She did not tolerate the Arnuity.   Is it the Flovent Diskus?   Salvatore Marvel, MD [facility] [place]

## 2019-09-02 NOTE — Telephone Encounter (Signed)
I'm assuming that she has to try and fail Flovent first in order to be able to try another medication. Unfortunately the pharmacy did not state which type of Flovent. From what I can see it seems like all types of Flovent are preferred. The Asmanex is non formulary which is why we are receiving a kick back.

## 2019-09-02 NOTE — Telephone Encounter (Signed)
Received a fax from [facility] stating that Asmanex is non formulary and that Flovent is preferred first. Please advise.

## 2019-09-02 NOTE — Progress Notes (Signed)
FOLLOW UP  Date of Service/Encounter:  09/02/19   Assessment:   Mild persistent asthma without complication  Non-allergic rhinitis  Plan/Recommendations:   1. Moderate intermittent asthma, uncomplicated - Lung testing looked stable today.  - We are going to try sending in Asmanex again to see if we can get that covered. - Stop the Arnuity. - Daily controller medication(s): Asmanex Twisthaler 258mcg 1 puffs twice daily - Rescue medications: albuterol nebulizer one vial every 4-6 hours as needed every 4 hours as needed - Asthma control goals:  * Full participation in all desired activities (may need albuterol before activity) * Albuterol use two time or less a week on average (not counting use with activity) * Cough interfering with sleep two time or less a month * Oral steroids no more than once a year * No hospitalizations  2. Non-allergic rhinitis - Continue with: Astelin (azelastine) 2 sprays per nostril TWO TIMES daily and Atrovent (ipratropium) nasal spray 1 spray per nostril THREE TIMES DAILY - Add on: RyVent 6mg  daily as needed for runny nose (samples provided)  3. Return in about 3 months (around 12/02/2019). This can be an in-person, a virtual Webex or a telephone follow up visit.  Subjective:   Sabrina Morrison is a 86 y.o. female presenting today for follow up of  Chief Complaint  Patient presents with  . Asthma    medication management    Sabrina Morrison has a history of the following: Patient Active Problem List   Diagnosis Date Noted  . Loss of weight 07/23/2018  . Mild intermittent asthma, uncomplicated [id number]  . Chronic rhinitis 04/23/2018  . Anemia 08/11/2014  . Occlusion and stenosis of carotid artery without mention of cerebral infarction 11/10/2012  . IDA (iron deficiency anemia) 05/25/2012  . HIATAL HERNIA 04/05/2010  . GASTROPARESIS 09/20/2008  . HEADACHE 09/20/2008  . HOARSENESS 09/20/2008  . NAUSEA 09/20/2008  . Esophageal  dysphagia 09/20/2008  . DIARRHEA 09/20/2008  . EPIGASTRIC PAIN 09/20/2008  . SCHATZKI'S RING, HX OF 09/20/2008  . HYPOTHYROIDISM 03/08/2007  . ALLERGIC RHINITIS 03/08/2007  . ASTHMA 03/08/2007  . GERD (gastroesophageal reflux disease) 03/08/2007    History obtained from: chart review and patient.  Sabrina Morrison is a 86 y.o. female presenting for a follow up visit. She was last seen in September 2020. At that time, lung testing looked stable. We continued her on Asmanex 296mcg one puff twice daily. We also continued with Atrovent nebulizer as needed.   Asthma/Respiratory Symptom History: She stopped her nebulized Atrovent because apparently it says that it causes UTI. She also developed back pain which she blamed on Arnuity. Apparently it is litsed as a side effect on the inhaler. She was on [employer] and she thinks that she liked that.  Her insurance did not cover it.  This is why we have changed to [place].  As of her visit in September because of her controller medication as well as albuterol, so she used nebulized Atrovent prescribed by her PCP.  When I first saw her, she did not carry the diagnosis of asthma but this is just crept up over time.  She has never been on a combined ICS/LABA.  ACT is 21, indicating excellent asthma control.   Allergic Rhinitis Symptom History: She was on Xyzal 5mg  daily. I started her on that in January 2020. She tells me today that this is not working for her at all.  She has been on Zyrtec in the past.  She needs something  to help with her runny nose.  She prefers a pill.  She does like the nose sprays that I have her on but has been using those without a problem.  She has not needed antibiotics at all since last visit.  Otherwise, there have been no changes to her past medical history, surgical history, family history, or social history.    Review of Systems  Constitutional: Negative.  Negative for chills, fever, malaise/fatigue and weight loss.  HENT:  Negative for congestion, ear discharge, ear pain and sore throat.   Eyes: Negative for pain, discharge and redness.  Respiratory: Negative for cough, sputum production, shortness of breath and wheezing.   Cardiovascular: Negative.  Negative for chest pain and palpitations.  Gastrointestinal: Negative for abdominal pain, constipation, diarrhea, heartburn, nausea and vomiting.  Skin: Negative.  Negative for itching and rash.  Neurological: Negative for dizziness and headaches.  Endo/Heme/Allergies: Negative for environmental allergies. Does not bruise/bleed easily.       Objective:   Blood pressure 140/60, pulse 65, temperature 98.1 F (36.7 C), temperature source Temporal, resp. rate 14, height 5\' 1"  (1.549 m), weight 139 lb (63 kg), SpO2 93 %. Body mass index is 26.26 kg/m.   Physical Exam:  Physical Exam  Constitutional: She appears well-developed.  Amusing female. Very feisty.   HENT:  Head: Normocephalic and atraumatic.  Right Ear: Tympanic membrane, external ear and ear canal normal.  Left Ear: Tympanic membrane, external ear and ear canal normal.  Nose: Mucosal edema and rhinorrhea present. No nasal deformity or septal deviation. No epistaxis. Right sinus exhibits no maxillary sinus tenderness and no frontal sinus tenderness. Left sinus exhibits no maxillary sinus tenderness and no frontal sinus tenderness.  Mouth/Throat: Uvula is midline and oropharynx is clear and moist. Mucous membranes are not pale and not dry.  Cobblestoning in the posterior oropharynx.   Eyes: Pupils are equal, round, and reactive to light. Conjunctivae and EOM are normal. Right eye exhibits no chemosis and no discharge. Left eye exhibits no chemosis and no discharge. Right conjunctiva is not injected. Left conjunctiva is not injected.  Cardiovascular: Normal rate, regular rhythm and normal heart sounds.  Respiratory: Effort normal and breath sounds normal. No accessory muscle usage. No tachypnea. No  respiratory distress. She has no wheezes. She has no rhonchi. She has no rales. She exhibits no tenderness.  Moving air well in all lung fields. No increased work of breathing noted. Faint expiratory wheezes noted.   Lymphadenopathy:    She has no cervical adenopathy.  Neurological: She is alert.  Skin: No abrasion, no petechiae and no rash noted. Rash is not papular, not vesicular and not urticarial. No erythema. No pallor.  No eczematous or urticaria lesions noted.   Psychiatric: She has a normal mood and affect.     Diagnostic studies:    Spirometry: results normal (FEV1: 1.16/80%, FVC: 1.39/70%, FEV1/FVC: 83%).    Spirometry consistent with normal pattern.   Allergy Studies: none       Salvatore Marvel, MD  [facility] [place]

## 2019-09-03 ENCOUNTER — Other Ambulatory Visit: Payer: Self-pay

## 2019-09-03 DIAGNOSIS — M25552 Pain in left hip: Secondary | ICD-10-CM | POA: Diagnosis not present

## 2019-09-03 DIAGNOSIS — D519 Vitamin B12 deficiency anemia, unspecified: Secondary | ICD-10-CM | POA: Diagnosis not present

## 2019-09-03 DIAGNOSIS — Z6825 Body mass index (BMI) 25.0-25.9, adult: Secondary | ICD-10-CM | POA: Diagnosis not present

## 2019-09-03 MED ORDER — FLOVENT HFA 110 MCG/ACT IN AERO: 2.0000 | INHALATION_SPRAY | Freq: Two times a day (BID) | RESPIRATORY_TRACT | 5 refills | Status: DC

## 2019-09-03 NOTE — Telephone Encounter (Signed)
Attempted to call patient regarding medication change but she did not answer and was unable to lmom.   Did you want Flovent 110 or 220?

## 2019-09-03 NOTE — Telephone Encounter (Signed)
Flovent 110 mcg 2 puffs twice daily.  Salvatore Marvel, MD [facility] [place] [facility] [place]

## 2019-09-03 NOTE — Telephone Encounter (Signed)
Prescription has been sent for Flovent per providers instructions. I called patient's number in chart two times with a busy tone both times.

## 2019-09-03 NOTE — Addendum Note (Signed)
Addended by: Lucrezia Starch I on: 09/03/2019 06:50 PM   Modules accepted: Orders

## 2019-09-03 NOTE — Telephone Encounter (Signed)
Fine.  Let's send in Flovent 210 mcg 2 puffs twice daily with a spacer.  Salvatore Marvel, MD [facility] [place] [facility] [place]

## 2019-09-03 NOTE — Telephone Encounter (Signed)
Patient informed of medication change 

## 2019-09-03 NOTE — Addendum Note (Signed)
Addended by: Isabel Caprice on: 09/03/2019 08:33 AM   Modules accepted: Orders

## 2019-09-07 DIAGNOSIS — M25552 Pain in left hip: Secondary | ICD-10-CM | POA: Diagnosis not present

## 2019-09-07 DIAGNOSIS — M549 Dorsalgia, unspecified: Secondary | ICD-10-CM | POA: Diagnosis not present

## 2019-09-08 ENCOUNTER — Ambulatory Visit: Payer: Medicare HMO | Admitting: Gastroenterology

## 2019-09-09 ENCOUNTER — Other Ambulatory Visit: Payer: Self-pay

## 2019-09-09 ENCOUNTER — Encounter: Payer: Self-pay | Admitting: Urology

## 2019-09-09 ENCOUNTER — Ambulatory Visit: Payer: Medicare HMO | Admitting: Urology

## 2019-09-09 ENCOUNTER — Other Ambulatory Visit (HOSPITAL_COMMUNITY)
Admission: RE | Admit: 2019-09-09 | Discharge: 2019-09-09 | Disposition: A | Discharge status: Home / Self Care | Payer: Medicare HMO | Source: Ambulatory Visit | Attending: Urology | Admitting: Urology

## 2019-09-09 VITALS — BP 182/69 | HR 67 | Temp 97.6°F | Ht 61.0 in | Wt 141.0 lb

## 2019-09-09 DIAGNOSIS — N3021 Other chronic cystitis with hematuria: Secondary | ICD-10-CM | POA: Diagnosis not present

## 2019-09-09 DIAGNOSIS — N3941 Urge incontinence: Secondary | ICD-10-CM | POA: Diagnosis not present

## 2019-09-09 DIAGNOSIS — N3010 Interstitial cystitis (chronic) without hematuria: Secondary | ICD-10-CM

## 2019-09-09 LAB — POCT URINALYSIS DIPSTICK
Bilirubin, UA: NEGATIVE
Blood, UA: NEGATIVE
Glucose, UA: NEGATIVE
Ketones, UA: NEGATIVE
Nitrite, UA: NEGATIVE
Protein, UA: NEGATIVE
Spec Grav, UA: 1.025 (ref 1.010–1.025)
Urobilinogen, UA: 0.2 E.U./dL
pH, UA: 5 (ref 5.0–8.0)

## 2019-09-09 LAB — BLADDER SCAN AMB NON-IMAGING: Scan Result: 47.7

## 2019-09-09 MED ORDER — FESOTERODINE FUMARATE ER 4 MG PO TB24: 4.0000 mg | ORAL_TABLET | Freq: Every day | ORAL | 3 refills | Status: DC

## 2019-09-09 NOTE — Progress Notes (Signed)
Urological Symptom Review  Patient is experiencing the following symptoms: Frequent urination Hard to postpone urination Get up at night to urinate Leakage of urine Urinary tract infection   Review of Systems  Gastrointestinal (upper)  : Indigestion/heartburn  Gastrointestinal (lower) : Negative for lower GI symptoms  Constitutional : Fatigue  Skin: Negative for skin symptoms  Eyes: Negative for eye symptoms  Ear/Nose/Throat : Sinus problems  Hematologic/Lymphatic: Negative for Hematologic/Lymphatic symptoms  Cardiovascular : Negative for cardiovascular symptoms  Respiratory : Negative for respiratory symptoms  Endocrine: Negative for endocrine symptoms  Musculoskeletal: Back pain  Neurological: Negative for neurological symptoms  Psychologic: Negative for psychiatric symptoms

## 2019-09-09 NOTE — Patient Instructions (Signed)

## 2019-09-09 NOTE — Addendum Note (Signed)
Addended by: Cleon Gustin on: 09/09/2019 02:40 PM   Modules accepted: Orders

## 2019-09-09 NOTE — Progress Notes (Signed)
09/09/2019 2:28 PM   Angela Nevin Janetta Hora 02/05/1931 [id number]  Referring provider: Caryl Bis, MD [place],  [place] [address]  Recurrent UTI  HPI: Ms Berl is a [phone] here for followup for OAB and recurrent UTIs. Last visit she was given rx for nitrofurintoin 50mg  qhs but she has not been taking the medication.  She has 2 UTIs since last. She was also prescribed Lisbeth Ply which she is not taking. She has urgency but no urge incontinence. She has urinary frequency every 30-60 minutes. No hematuria. No dysuira   PMH: Past Medical History:  Diagnosis Date  . Allergic rhinitis   . Allergy   . Asthma   . Cancer ([place])    skin cancer form left arm  . Cataract   . Fibromyalgia   . Gastroparesis 08/2008   mild, 35% retention at 2 hours (normal is less than 30% at 2 hours)  . GERD (gastroesophageal reflux disease)   . Hiatal hernia 09/01/2004   large  . Hyperplastic colon polyp   . Hypothyroidism   . IDA (iron deficiency anemia)   . Irritable bowel syndrome   . Muscle pain, fibromyalgia   . PONV (postoperative nausea and vomiting)   . Schatzki's ring    h/o 8 EGDs  . Shortness of breath   . Tubular adenoma     Surgical History: Past Surgical History:  Procedure Laterality Date  . ABDOMINAL HYSTERECTOMY    . APPENDECTOMY    . ARM SKIN LESION BIOPSY / EXCISION     left arm  . CATARACT EXTRACTION W/PHACO  08/27/2011   Procedure: CATARACT EXTRACTION PHACO AND INTRAOCULAR LENS PLACEMENT (IOC);  Surgeon: Tonny Branch, MD;  Location: [facility];  Service: Ophthalmology;  Laterality: Right;  CDE: 15.12  . COLON SURGERY    . COLONOSCOPY  09/01/2004   RMR: normal, due 08/2014  . COLONOSCOPY WITH ESOPHAGOGASTRODUODENOSCOPY (EGD)  06/05/2012   Dr. Gala Romney- EGD- "corkscrewed" tubular esophagus.  hiatal hernia beign bx. TCS- colonic divertiulosis, tubular adenoma, hyperplastic polyp  . DILATION AND CURETTAGE OF UTERUS     x2  . ESOPHAGOGASTRODUODENOSCOPY  8/08   RMR: cork-screwed  esophagus,prominent Schatki's ring large hh  . ESOPHAGOGASTRODUODENOSCOPY  04/18/10   prominent schatzki's ring/large hiatal hernia  otherwise normal stomach  . ESOPHAGOGASTRODUODENOSCOPY   09/25/2002   RMR: Prominent Schatski's ring, foreshortened esophagus, moderate size hiatal hernia.  The remainder of the stomach and duodenum through the second portion appeared normal.  Status post balloon dilation of ring,  . ESOPHAGOGASTRODUODENOSCOPY  01/28/2004   [id number] ring status post dilation as described above. Otherwise normal/ Large hiatal hernia  . ESOPHAGOGASTRODUODENOSCOPY  09/01/2004   RMR: Schatzki's ring, otherwise normal esophagus status post dilation/Moderately large hiatal hernia  . ESOPHAGOGASTRODUODENOSCOPY   12/14/2005   RMR: Schatzki's ring, otherwise normal esophagus status post dilation /Large hiatal hernia  . EYE SURGERY    . GIVENS CAPSULE STUDY  06/26/2012   Dr. Gala Romney- probable 2 superficial erosions in the mid small bowel, no evidence of tumor or blood anywhere in the small intestine.  . S/P Hysterectomy    . segmental colectomy     with incidental appendectomy in [place]  . YAG LASER APPLICATION Bilateral [id number]   Procedure: YAG LASER APPLICATION;  Surgeon: Williams Che, MD;  Location: [facility];  Service: Ophthalmology;  Laterality: Bilateral;    Home Medications:  Allergies as of 09/09/2019      Reactions   Codeine Nausea And Vomiting  Coumadin [warfarin]    Dexlansoprazole    REACTION: chest tightness, muscle aches, but tolerates prevacid   Erythromycin Ethylsuccinate    REACTION: left sided weakness   Esomeprazole Magnesium Nausea And Vomiting   Moxifloxacin Nausea And Vomiting   Omeprazole    REACTION: muscle ache/chest tightness   Sulfonamide Derivatives Nausea And Vomiting      Medication List       Accurate as of September 09, 2019  2:28 PM. If you have any questions, ask your nurse or doctor.        acetaminophen 500 MG tablet Commonly  known as: TYLENOL Take 500 mg by mouth every 6 (six) hours as needed. pain   albuterol 108 (90 Base) MCG/ACT inhaler Commonly known as: VENTOLIN HFA 2 puffs by mouth every 4-6 hours as needed.   amLODipine 2.5 MG tablet Commonly known as: NORVASC Take 2.5 mg by mouth daily.   Arnuity Ellipta 200 MCG/ACT Aepb Generic drug: Fluticasone Furoate Inhale 1 puff into the lungs daily.   Asmanex (30 Metered Doses) 220 MCG/INH inhaler Generic drug: mometasone Inhale 1 puff into the lungs 2 (two) times daily.   azelastine 0.1 % nasal spray Commonly known as: ASTELIN Place 2 sprays into both nostrils 2 (two) times daily.   CALCIUM 600 PO Take by mouth daily.   calcium elemental as carbonate 400 MG chewable tablet Commonly known as: BARIATRIC TUMS ULTRA Chew 3,000 mg by mouth as needed for heartburn.   Carafate 1 GM/10ML suspension Generic drug: sucralfate Take 1 g by mouth as needed.   cephALEXin 250 MG capsule Commonly known as: KEFLEX Take 250 mg by mouth 3 (three) times daily.   Cranberry 500 MG Caps Take 500 mg by mouth daily.   B-12 COMPLIANCE INJECTION IJ Inject as directed.   cyanocobalamin 1000 MCG/ML injection Commonly known as: (VITAMIN B-12) Inject 1,000 mcg into the muscle every 14 (fourteen) days.   diphenoxylate-atropine 2.5-0.025 MG tablet Commonly known as: LOMOTIL Take 1 tablet by mouth 4 (four) times daily as needed. diarrhea   Flovent HFA 110 MCG/ACT inhaler Generic drug: fluticasone Inhale 2 puffs into the lungs 2 (two) times daily.   fluticasone 50 MCG/ACT nasal spray Commonly known as: FLONASE Place 2 sprays into the nose daily as needed. Congestion   GAS-X PO Take 180 mg by mouth 4 (four) times daily as needed.   ipratropium 0.02 % nebulizer solution Commonly known as: ATROVENT   ipratropium 0.03 % nasal spray Commonly known as: ATROVENT Place 1 spray into both nostrils every 6 (six) hours as needed for rhinitis.   levocetirizine 5 MG  tablet Commonly known as: XYZAL Take 5 mg by mouth every evening.   levothyroxine 100 MCG tablet Commonly known as: SYNTHROID Take 100 mcg by mouth daily before breakfast.   Magnesium 250 MG Tabs Take by mouth daily.   mirtazapine 15 MG tablet Commonly known as: REMERON Take 7.5 mg by mouth at bedtime.   Myrbetriq 25 MG Tb24 tablet Generic drug: mirabegron ER 25 mg daily.   ondansetron 8 MG tablet Commonly known as: ZOFRAN Take 4-8 mg by mouth every 8 (eight) hours as needed for nausea or vomiting.   pantoprazole 40 MG tablet Commonly known as: PROTONIX Take 40 mg by mouth daily.   traMADol 50 MG tablet Commonly known as: ULTRAM Take 50 mg by mouth as needed.   Vitamin C 500 MG Caps Take by mouth daily.       Allergies:  Allergies  Allergen Reactions  .  Codeine Nausea And Vomiting  . Coumadin [Warfarin]   . Dexlansoprazole     REACTION: chest tightness, muscle aches, but tolerates prevacid  . Erythromycin Ethylsuccinate     REACTION: left sided weakness  . Esomeprazole Magnesium Nausea And Vomiting  . Moxifloxacin Nausea And Vomiting  . Omeprazole     REACTION: muscle ache/chest tightness  . Sulfonamide Derivatives Nausea And Vomiting    Family History: Family History  Problem Relation Age of Onset  . Heart attack Father 35       deceased  . Colon cancer Neg Hx   . Anesthesia problems Neg Hx   . Hypotension Neg Hx   . Malignant hyperthermia Neg Hx   . Pseudochol deficiency Neg Hx     Social History:  reports that she has never smoked. She has never used smokeless tobacco. She reports that she does not drink alcohol or use drugs.  ROS: All other review of systems were reviewed and are negative except what is noted above in HPI  Physical Exam: BP (!) 182/69   Pulse 67   Temp 97.6 F (36.4 C)   Ht 5\' 1"  (1.549 m)   Wt 141 lb (64 kg)   BMI 26.64 kg/m   Constitutional:  Alert and oriented, No acute distress. HEENT: [place] AT, moist mucus  membranes.  Trachea midline, no masses. Cardiovascular: No clubbing, cyanosis, or edema. Respiratory: Normal respiratory effort, no increased work of breathing. GI: Abdomen is soft, nontender, nondistended, no abdominal masses GU: No CVA tenderness.  Lymph: No cervical or inguinal lymphadenopathy. Skin: No rashes, bruises or suspicious lesions. Neurologic: Grossly intact, no focal deficits, moving all 4 extremities. Psychiatric: Normal mood and affect.  Laboratory Data: Lab Results  Component Value Date   WBC 6.0 08/24/2014   HGB 11.2 (L) 08/24/2014   HCT 34.3 (L) 08/24/2014   MCV 95.8 08/24/2014   PLT 313 08/24/2014    Lab Results  Component Value Date   CREATININE 0.89 08/21/2011    No results found for: PSA  No results found for: TESTOSTERONE  No results found for: HGBA1C  Urinalysis No results found for: COLORURINE, APPEARANCEUR, LABSPEC, PHURINE, GLUCOSEU, HGBUR, BILIRUBINUR, KETONESUR, PROTEINUR, UROBILINOGEN, NITRITE, LEUKOCYTESUR  No results found for: LABMICR, [place], RBCUA, LABEPIT, MUCUS, BACTERIA  Pertinent Imaging:  No results found for this or any previous visit. No results found for this or any previous visit. No results found for this or any previous visit. No results found for this or any previous visit. No results found for this or any previous visit. No results found for this or any previous visit. No results found for this or any previous visit. No results found for this or any previous visit.  Assessment & Plan:    1.  Urge urinary incontinence -restart toviaz 4mg  daily - POCT urinalysis dipstick - BLADDER SCAN AMB NON-IMAGING  2. Chronic cystitis with hematuria We discussed prophylaxis and she defers at this time   No follow-ups on file.  Nicolette Bang, MD  [facility] [place]

## 2019-09-11 LAB — URINE CULTURE: Culture: 10000 — AB

## 2019-09-14 ENCOUNTER — Other Ambulatory Visit: Payer: Self-pay | Admitting: Allergy & Immunology

## 2019-09-14 DIAGNOSIS — H524 Presbyopia: Secondary | ICD-10-CM | POA: Diagnosis not present

## 2019-09-18 DIAGNOSIS — E538 Deficiency of other specified B group vitamins: Secondary | ICD-10-CM | POA: Diagnosis not present

## 2019-09-22 ENCOUNTER — Encounter: Payer: Self-pay | Admitting: Gastroenterology

## 2019-09-22 ENCOUNTER — Telehealth: Payer: Self-pay

## 2019-09-22 ENCOUNTER — Ambulatory Visit: Payer: Medicare HMO | Admitting: Gastroenterology

## 2019-09-22 ENCOUNTER — Other Ambulatory Visit: Payer: Self-pay

## 2019-09-22 VITALS — BP 133/70 | HR 61 | Temp 96.6°F | Ht 61.0 in | Wt 140.6 lb

## 2019-09-22 DIAGNOSIS — K219 Gastro-esophageal reflux disease without esophagitis: Secondary | ICD-10-CM

## 2019-09-22 DIAGNOSIS — N390 Urinary tract infection, site not specified: Secondary | ICD-10-CM

## 2019-09-22 MED ORDER — CEFUROXIME AXETIL 500 MG PO TABS: 500.0000 mg | ORAL_TABLET | Freq: Two times a day (BID) | ORAL | 0 refills | Status: DC

## 2019-09-22 NOTE — Patient Instructions (Signed)
1. Continue pantoprazole 40mg  daily before breakfast for your reflux. 2. I will contact Dr. Quillian Quince and let them know you continue to get omeprazole from [facility] and that you are not taking it.  3. Return to the office in six months or call sooner if needed.

## 2019-09-22 NOTE — Progress Notes (Signed)
Primary Care Physician: Caryl Bis, MD  Primary Gastroenterologist:  Garfield Cornea, MD   Chief Complaint  Patient presents with  . Gastroesophageal Reflux    HPI: Sabrina Morrison is a 86 y.o. female here to follow up GERD.  Last seen in October 2020. She is on pantoprazole 40mg  daily. RX written by PCP and receiving from [facility]. She is also getting omeprazole from [facility] but is not taking, written by PCP. She is doing well on pantoprazole. No heartburn. No significant abdominal pain. BM regular. No melena, brbpr. No weight loss.   Current Outpatient Medications  Medication Sig Dispense Refill  . acetaminophen (TYLENOL) 500 MG tablet Take 500 mg by mouth every 6 (six) hours as needed. pain    . azelastine (ASTELIN) 0.1 % nasal spray Place 2 sprays into both nostrils 2 (two) times daily. (Patient taking differently: Place 2 sprays into both nostrils as needed. ) 30 mL 1  . calcium elemental as carbonate (BARIATRIC TUMS ULTRA) 400 MG chewable tablet Chew 3,000 mg by mouth as needed for heartburn.    Marland Kitchen CARAFATE 1 GM/10ML suspension Take 1 g by mouth as needed.     . Cranberry 500 MG CAPS Take 500 mg by mouth daily.    . cyanocobalamin (,VITAMIN B-12,) 1000 MCG/ML injection Inject 1,000 mcg into the muscle every 14 (fourteen) days.    . Cyanocobalamin (B-12 COMPLIANCE INJECTION IJ) Inject as directed.    . diphenoxylate-atropine (LOMOTIL) 2.5-0.025 MG per tablet Take 1 tablet by mouth 4 (four) times daily as needed. diarrhea    . fesoterodine (TOVIAZ) 4 MG TB24 tablet Take 1 tablet (4 mg total) by mouth daily. 90 tablet 3  . fluticasone (FLONASE) 50 MCG/ACT nasal spray Place 2 sprays into the nose daily as needed. Congestion    . ipratropium (ATROVENT) 0.03 % nasal spray PLACE 1 SPRAY INTO BOTH NOSTRILS EVERY 6 HOURS AS NEEDED FOR RHINITIS. 30 mL 5  . levothyroxine (SYNTHROID) 88 MCG tablet Take 88 mcg by mouth daily before breakfast.    . Magnesium 250 MG TABS Take by  mouth daily.    . melatonin 3 MG TABS tablet Take 3 mg by mouth at bedtime as needed.    . mirtazapine (REMERON) 15 MG tablet Take 7.5 mg by mouth at bedtime.    . ondansetron (ZOFRAN) 8 MG tablet Take 4-8 mg by mouth every 8 (eight) hours as needed for nausea or vomiting.    . pantoprazole (PROTONIX) 40 MG tablet Take 40 mg by mouth daily.    . Simethicone (GAS-X PO) Take 180 mg by mouth 4 (four) times daily as needed.     . traMADol (ULTRAM) 50 MG tablet Take 50 mg by mouth as needed.      No current facility-administered medications for this visit.    Allergies as of 09/22/2019 - Review Complete 09/22/2019  Allergen Reaction Noted  . Codeine Nausea And Vomiting   . Coumadin [warfarin]  06/19/2012  . Dexlansoprazole  04/05/2010  . Erythromycin ethylsuccinate  04/05/2010  . Esomeprazole magnesium Nausea And Vomiting   . Moxifloxacin Nausea And Vomiting 04/05/2010  . Omeprazole  04/05/2010  . Sulfonamide derivatives Nausea And Vomiting     ROS:  General: Negative for anorexia, weight loss, fever, chills, fatigue, weakness. ENT: Negative for hoarseness, difficulty swallowing , nasal congestion. CV: Negative for chest pain, angina, palpitations, dyspnea on exertion, peripheral edema.  Respiratory: Negative for dyspnea at rest, dyspnea on exertion, cough,  sputum, wheezing.  GI: See history of present illness. GU:  Negative for dysuria, hematuria, urinary incontinence, urinary frequency, nocturnal urination.  Endo: Negative for unusual weight change.    Physical Examination:   BP 133/70   Pulse 61   Temp (!) 96.6 F (35.9 C) (Temporal)   Ht 5\' 1"  (1.549 m)   Wt 140 lb 9.6 oz (63.8 kg)   BMI 26.57 kg/m   General: Well-nourished, well-developed in no acute distress.  Eyes: No icterus. Mouth: masked. Lungs: Clear to auscultation bilaterally.  Heart: Regular rate and rhythm, no murmurs rubs or gallops.  Abdomen: Bowel sounds are normal, nontender, nondistended, no  hepatosplenomegaly or masses, no abdominal bruits or hernia , no rebound or guarding.   Extremities: No lower extremity edema. No clubbing or deformities. Neuro: Alert and oriented x 4   Skin: Warm and dry, no jaundice.   Psych: Alert and cooperative, normal mood and affect.  Imaging Studies: No results found.

## 2019-09-22 NOTE — Assessment & Plan Note (Signed)
Doing well. Continue pantoprazole 40mg  daily before breakfast. I called [facility] to let them know she plans to continue pantoprazole as ordered from them. Our office contacted PCP to let them know patient does not need and does not want omeprazole from [facility]. Patient to contact [name] as well. Return to the office in six months or sooner if needed.

## 2019-09-25 NOTE — Telephone Encounter (Signed)
Called. No answer. No way to leave message

## 2019-10-01 DIAGNOSIS — M25552 Pain in left hip: Secondary | ICD-10-CM | POA: Diagnosis not present

## 2019-10-01 DIAGNOSIS — Z6825 Body mass index (BMI) 25.0-25.9, adult: Secondary | ICD-10-CM | POA: Diagnosis not present

## 2019-10-01 DIAGNOSIS — D519 Vitamin B12 deficiency anemia, unspecified: Secondary | ICD-10-CM | POA: Diagnosis not present

## 2019-10-02 DIAGNOSIS — I10 Essential (primary) hypertension: Secondary | ICD-10-CM | POA: Diagnosis not present

## 2019-10-02 DIAGNOSIS — K219 Gastro-esophageal reflux disease without esophagitis: Secondary | ICD-10-CM | POA: Diagnosis not present

## 2019-10-15 DIAGNOSIS — M797 Fibromyalgia: Secondary | ICD-10-CM | POA: Diagnosis not present

## 2019-10-15 DIAGNOSIS — D519 Vitamin B12 deficiency anemia, unspecified: Secondary | ICD-10-CM | POA: Diagnosis not present

## 2019-10-15 DIAGNOSIS — K219 Gastro-esophageal reflux disease without esophagitis: Secondary | ICD-10-CM | POA: Diagnosis not present

## 2019-10-15 DIAGNOSIS — E039 Hypothyroidism, unspecified: Secondary | ICD-10-CM | POA: Diagnosis not present

## 2019-10-15 DIAGNOSIS — R946 Abnormal results of thyroid function studies: Secondary | ICD-10-CM | POA: Diagnosis not present

## 2019-10-15 DIAGNOSIS — E782 Mixed hyperlipidemia: Secondary | ICD-10-CM | POA: Diagnosis not present

## 2019-10-20 DIAGNOSIS — K219 Gastro-esophageal reflux disease without esophagitis: Secondary | ICD-10-CM | POA: Diagnosis not present

## 2019-10-20 DIAGNOSIS — F1190 Opioid use, unspecified, uncomplicated: Secondary | ICD-10-CM | POA: Diagnosis not present

## 2019-10-20 DIAGNOSIS — R4582 Worries: Secondary | ICD-10-CM | POA: Diagnosis not present

## 2019-10-20 DIAGNOSIS — I270 Primary pulmonary hypertension: Secondary | ICD-10-CM | POA: Diagnosis not present

## 2019-10-20 DIAGNOSIS — M797 Fibromyalgia: Secondary | ICD-10-CM | POA: Diagnosis not present

## 2019-10-20 DIAGNOSIS — J301 Allergic rhinitis due to pollen: Secondary | ICD-10-CM | POA: Diagnosis not present

## 2019-10-20 DIAGNOSIS — I10 Essential (primary) hypertension: Secondary | ICD-10-CM | POA: Diagnosis not present

## 2019-10-20 DIAGNOSIS — D692 Other nonthrombocytopenic purpura: Secondary | ICD-10-CM | POA: Diagnosis not present

## 2019-10-20 DIAGNOSIS — K580 Irritable bowel syndrome with diarrhea: Secondary | ICD-10-CM | POA: Diagnosis not present

## 2019-10-20 DIAGNOSIS — Z6824 Body mass index (BMI) 24.0-24.9, adult: Secondary | ICD-10-CM | POA: Diagnosis not present

## 2019-10-20 DIAGNOSIS — M545 Low back pain: Secondary | ICD-10-CM | POA: Diagnosis not present

## 2019-10-20 DIAGNOSIS — Z0001 Encounter for general adult medical examination with abnormal findings: Secondary | ICD-10-CM | POA: Diagnosis not present

## 2019-10-20 DIAGNOSIS — E539 Vitamin B deficiency, unspecified: Secondary | ICD-10-CM | POA: Diagnosis not present

## 2019-10-30 DIAGNOSIS — R110 Nausea: Secondary | ICD-10-CM | POA: Diagnosis not present

## 2019-10-30 DIAGNOSIS — K219 Gastro-esophageal reflux disease without esophagitis: Secondary | ICD-10-CM | POA: Diagnosis not present

## 2019-11-02 DIAGNOSIS — E7849 Other hyperlipidemia: Secondary | ICD-10-CM | POA: Diagnosis not present

## 2019-11-02 DIAGNOSIS — J449 Chronic obstructive pulmonary disease, unspecified: Secondary | ICD-10-CM | POA: Diagnosis not present

## 2019-11-02 DIAGNOSIS — J4541 Moderate persistent asthma with (acute) exacerbation: Secondary | ICD-10-CM | POA: Diagnosis not present

## 2019-11-02 DIAGNOSIS — I10 Essential (primary) hypertension: Secondary | ICD-10-CM | POA: Diagnosis not present

## 2019-11-03 DIAGNOSIS — E538 Deficiency of other specified B group vitamins: Secondary | ICD-10-CM | POA: Diagnosis not present

## 2019-11-05 DIAGNOSIS — K580 Irritable bowel syndrome with diarrhea: Secondary | ICD-10-CM | POA: Diagnosis not present

## 2019-11-05 DIAGNOSIS — E039 Hypothyroidism, unspecified: Secondary | ICD-10-CM | POA: Diagnosis not present

## 2019-11-05 DIAGNOSIS — I10 Essential (primary) hypertension: Secondary | ICD-10-CM | POA: Diagnosis not present

## 2019-11-05 DIAGNOSIS — F1190 Opioid use, unspecified, uncomplicated: Secondary | ICD-10-CM | POA: Diagnosis not present

## 2019-11-05 DIAGNOSIS — K219 Gastro-esophageal reflux disease without esophagitis: Secondary | ICD-10-CM | POA: Diagnosis not present

## 2019-11-05 DIAGNOSIS — I270 Primary pulmonary hypertension: Secondary | ICD-10-CM | POA: Diagnosis not present

## 2019-11-05 DIAGNOSIS — R4582 Worries: Secondary | ICD-10-CM | POA: Diagnosis not present

## 2019-11-05 DIAGNOSIS — E782 Mixed hyperlipidemia: Secondary | ICD-10-CM | POA: Diagnosis not present

## 2019-11-10 ENCOUNTER — Telehealth: Payer: Self-pay | Admitting: Urology

## 2019-11-10 NOTE — Telephone Encounter (Signed)
Pt requests nurse return her call about possible urinary infection.

## 2019-11-11 NOTE — Telephone Encounter (Signed)
Called pt. Felt like she may have infection . I explained drs. Were not in the rest of the week but to call her pcp and if needed [place] again to call back.

## 2019-11-13 DIAGNOSIS — R300 Dysuria: Secondary | ICD-10-CM | POA: Diagnosis not present

## 2019-11-17 DIAGNOSIS — E538 Deficiency of other specified B group vitamins: Secondary | ICD-10-CM | POA: Diagnosis not present

## 2019-11-18 DIAGNOSIS — N3090 Cystitis, unspecified without hematuria: Secondary | ICD-10-CM | POA: Diagnosis not present

## 2019-11-26 DIAGNOSIS — Z0100 Encounter for examination of eyes and vision without abnormal findings: Secondary | ICD-10-CM | POA: Diagnosis not present

## 2019-11-30 ENCOUNTER — Ambulatory Visit: Payer: Medicare HMO | Admitting: Family Medicine

## 2019-12-02 ENCOUNTER — Ambulatory Visit: Payer: Medicare HMO | Admitting: Allergy & Immunology

## 2019-12-02 DIAGNOSIS — J449 Chronic obstructive pulmonary disease, unspecified: Secondary | ICD-10-CM | POA: Diagnosis not present

## 2019-12-02 DIAGNOSIS — J4541 Moderate persistent asthma with (acute) exacerbation: Secondary | ICD-10-CM | POA: Diagnosis not present

## 2019-12-02 DIAGNOSIS — I10 Essential (primary) hypertension: Secondary | ICD-10-CM | POA: Diagnosis not present

## 2019-12-02 DIAGNOSIS — E7849 Other hyperlipidemia: Secondary | ICD-10-CM | POA: Diagnosis not present

## 2019-12-04 DIAGNOSIS — E538 Deficiency of other specified B group vitamins: Secondary | ICD-10-CM | POA: Diagnosis not present

## 2019-12-09 ENCOUNTER — Ambulatory Visit: Payer: Medicare HMO | Admitting: Gastroenterology

## 2019-12-09 ENCOUNTER — Other Ambulatory Visit: Payer: Self-pay

## 2019-12-09 ENCOUNTER — Encounter: Payer: Self-pay | Admitting: Gastroenterology

## 2019-12-09 VITALS — BP 155/74 | HR 91 | Temp 97.0°F | Ht 62.0 in | Wt 133.2 lb

## 2019-12-09 DIAGNOSIS — K219 Gastro-esophageal reflux disease without esophagitis: Secondary | ICD-10-CM | POA: Diagnosis not present

## 2019-12-09 NOTE — Patient Instructions (Addendum)
1. Increase pantoprazole to 40mg  twice daily before breakfast and supper. Call in 2-3 weeks and let me know if your reflux is better. If not, you may need an upper endoscopy. 2. You can take ondansetron 1/2 tablet up to three times daily for nausea.  3. Try to limit bismuth subsalicylate pills.  4. You can take TUMS and Carafate as needed per package label.

## 2019-12-09 NOTE — Progress Notes (Signed)
Primary Care Physician: Caryl Bis, MD  Primary Gastroenterologist:  Garfield Cornea, MD   Chief Complaint  Patient presents with  . Gastroesophageal Reflux    pain in chest after eating, nausea. Received omep 40mg  in the mail few months ago but never started. She is taking pantoprazole    HPI: Sabrina Morrison is a 86 y.o. female here for follow-up of GERD.  Last seen in April.  At time of last visit she was doing very well on pantoprazole 40 mg daily.  She continues to have issues receiving medications from [facility], prescribed by Dr. Quillian Quince, that she is not taking.  She continues to get omeprazole in the mail which is also listed on her allergies.  For reflux she is currently taking pantoprazole once daily.  If she starts having heartburn and nausea, at this point she is having daily, she will take ondansetron once, Carafate suspension, Tums, up to 2-4 bismuth subsalicylate's daily to control her symptoms.  She denies dysphagia.  She does have some epigastric discomfort, worse with meals.  Bowel movements are regular.  Denies melena or rectal bleeding.  Carafate liquid seems to help a lot when she is having difficulty getting her reflux under control.  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      Current Outpatient Medications  Medication Sig Dispense Refill  . acetaminophen (TYLENOL) 500 MG tablet Take 1,000 mg by mouth in the morning and at bedtime. pain    . calcium elemental as carbonate (BARIATRIC TUMS ULTRA) 400 MG chewable tablet 2 tablets prior to meals    . CARAFATE 1 GM/10ML suspension Take 1 g by mouth as needed.     . Cranberry 500 MG CAPS Take 500 mg by mouth daily.    . diphenoxylate-atropine (LOMOTIL) 2.5-0.025 MG per tablet Take 1 tablet by mouth 4 (four) times daily as needed. diarrhea    . fesoterodine (TOVIAZ) 4 MG TB24 tablet Take 1 tablet (4 mg total) by mouth daily. 90 tablet 3    . levothyroxine (SYNTHROID) 100 MCG tablet Take 100 mcg by mouth daily before breakfast. Name brand ONLY    . ondansetron (ZOFRAN) 8 MG tablet Take 4-8 mg by mouth every 8 (eight) hours as needed for nausea or vomiting.    . pantoprazole (PROTONIX) 40 MG tablet Take 40 mg by mouth daily.    . Probiotic Product (DIGESTIVE ADVANTAGE PO) Take by mouth daily.    . Simethicone (GAS-X PO) Take 180 mg by mouth 4 (four) times daily as needed.     . sucralfate (CARAFATE) 1 g tablet Take 1 g by mouth daily.    . traMADol (ULTRAM) 50 MG tablet Take 50 mg by mouth as needed.     Marland Kitchen azelastine (ASTELIN) 0.1 % nasal spray Place 2 sprays into both nostrils 2 (two) times daily. (Patient not taking: Reported on 12/09/2019) 30 mL 1  . cyanocobalamin (,VITAMIN B-12,) 1000 MCG/ML injection Inject 1,000 mcg into the muscle every 14 (fourteen) days. (Patient not taking: Reported on 12/09/2019)    . Cyanocobalamin (B-12 COMPLIANCE INJECTION IJ) Inject as directed. (Patient not taking: Reported on 12/09/2019)    . fluticasone (FLONASE) 50 MCG/ACT nasal spray Place 2 sprays into the nose daily as needed. Congestion (Patient not taking: Reported on 12/09/2019)    . ipratropium (ATROVENT) 0.03 % nasal spray PLACE 1 SPRAY INTO BOTH NOSTRILS EVERY 6 HOURS AS NEEDED FOR  RHINITIS. (Patient not taking: Reported on 12/09/2019) 30 mL 5  . levothyroxine (SYNTHROID) 88 MCG tablet Take 88 mcg by mouth daily before breakfast. (Patient not taking: Reported on 12/09/2019)    . Magnesium 250 MG TABS Take by mouth daily. (Patient not taking: Reported on 12/09/2019)    . melatonin 3 MG TABS tablet Take 3 mg by mouth at bedtime as needed. (Patient not taking: Reported on 12/09/2019)    . mirtazapine (REMERON) 15 MG tablet Take 7.5 mg by mouth at bedtime. (Patient not taking: Reported on 12/09/2019)     No current facility-administered medications for this visit.    Allergies as of 12/09/2019 - Review Complete 12/09/2019  Allergen Reaction Noted  .  Codeine Nausea And Vomiting   . Coumadin [warfarin]  06/19/2012  . Dexlansoprazole  04/05/2010  . Erythromycin ethylsuccinate  04/05/2010  . Esomeprazole magnesium Nausea And Vomiting   . Moxifloxacin Nausea And Vomiting 04/05/2010  . Omeprazole  04/05/2010  . Sulfonamide derivatives Nausea And Vomiting     ROS:  General: Negative for anorexia, weight loss, fever, chills, fatigue, weakness. ENT: Negative for hoarseness, difficulty swallowing , nasal congestion. CV: Negative for chest pain, angina, palpitations, dyspnea on exertion, peripheral edema.  Respiratory: Negative for dyspnea at rest, dyspnea on exertion, cough, sputum, wheezing.  GI: See history of present illness. GU:  Negative for dysuria, hematuria, urinary incontinence, urinary frequency, nocturnal urination.  Endo: Negative for unusual weight change.    Physical Examination:   BP (!) 155/74   Pulse 91   Temp (!) 97 F (36.1 C) (Oral)   Ht 5\' 2"  (1.575 m)   Wt 133 lb 3.2 oz (60.4 kg)   BMI 24.36 kg/m   General: Well-nourished, well-developed in no acute distress.  Eyes: No icterus. Mouth:masked Lungs: Scattered wheezes bilaterally.  Heart: Regular rate and rhythm, no murmurs rubs or gallops.  Abdomen: Bowel sounds are normal, nontender, nondistended, no hepatosplenomegaly or masses, no abdominal bruits or hernia , no rebound or guarding.   Extremities: No lower extremity edema. No clubbing or deformities. Neuro: Alert and oriented x 4   Skin: Warm and dry, no jaundice.   Psych: Alert and cooperative, normal mood and affect.   Imaging Studies: No results found.   Impression/plan:  86 year old female with history of chronic GERD presenting for follow-up.  She has been having some refractory symptoms since her last office visit, consisting of heartburn, upper abdominal discomfort, nausea often after meals.  No dysphagia.  Symptoms usually improves with Tums, Carafate.  Gallbladder remains in situ.  We  discussed increasing pantoprazole to 40 mg twice daily before breakfast and supper.  She can take ondansetron half a tablet (4 mg) up to 3 times daily as needed for nausea.  Would like her to discontinue bismuth subsalicylate, utilizing Carafate and/or Tums when needed.  We also offered her an upper endoscopy to evaluate refractory reflux, right now she would like to make changes as noted and let us know how she is doing in a few weeks.  If ongoing symptoms, would consider upper endoscopy versus abdominal ultrasound as the next step.

## 2019-12-17 ENCOUNTER — Ambulatory Visit: Payer: Medicare HMO | Admitting: Urology

## 2019-12-18 DIAGNOSIS — E539 Vitamin B deficiency, unspecified: Secondary | ICD-10-CM | POA: Diagnosis not present

## 2019-12-18 DIAGNOSIS — R300 Dysuria: Secondary | ICD-10-CM | POA: Diagnosis not present

## 2019-12-23 ENCOUNTER — Ambulatory Visit: Payer: Medicare HMO | Admitting: Allergy & Immunology

## 2019-12-24 ENCOUNTER — Other Ambulatory Visit (HOSPITAL_COMMUNITY)
Admission: RE | Admit: 2019-12-24 | Discharge: 2019-12-24 | Disposition: A | Discharge status: Home / Self Care | Payer: Medicare HMO | Source: Ambulatory Visit | Attending: Urology | Admitting: Urology

## 2019-12-24 ENCOUNTER — Ambulatory Visit: Payer: Medicare HMO | Admitting: Urology

## 2019-12-24 ENCOUNTER — Other Ambulatory Visit: Payer: Self-pay

## 2019-12-24 DIAGNOSIS — N390 Urinary tract infection, site not specified: Secondary | ICD-10-CM | POA: Insufficient documentation

## 2019-12-24 NOTE — Progress Notes (Unsigned)
Pt. Brought specimen for urine culture.

## 2019-12-25 LAB — URINE CULTURE: Culture: <10000 — AB

## 2019-12-29 ENCOUNTER — Telehealth: Payer: Self-pay | Admitting: Urology

## 2019-12-29 ENCOUNTER — Telehealth: Payer: Self-pay

## 2019-12-29 NOTE — Telephone Encounter (Signed)
Pt left VM to receive her results from urine specimen she left last week

## 2019-12-29 NOTE — Telephone Encounter (Signed)
Pt called for urine culture results. Urine culture reviewed with MD, Dr. Alyson Ingles. Will have pt f/u in 2 weeks for repeat UA test. Pt made aware.

## 2020-01-01 DIAGNOSIS — E538 Deficiency of other specified B group vitamins: Secondary | ICD-10-CM | POA: Diagnosis not present

## 2020-01-12 ENCOUNTER — Ambulatory Visit: Payer: Medicare HMO | Admitting: Urology

## 2020-01-15 ENCOUNTER — Ambulatory Visit: Payer: Medicare HMO | Admitting: Allergy & Immunology

## 2020-01-15 ENCOUNTER — Other Ambulatory Visit: Payer: Self-pay

## 2020-01-15 ENCOUNTER — Encounter: Payer: Self-pay | Admitting: Allergy & Immunology

## 2020-01-15 VITALS — BP 160/60 | HR 73

## 2020-01-15 DIAGNOSIS — E538 Deficiency of other specified B group vitamins: Secondary | ICD-10-CM | POA: Diagnosis not present

## 2020-01-15 DIAGNOSIS — Z79899 Other long term (current) drug therapy: Secondary | ICD-10-CM | POA: Diagnosis not present

## 2020-01-15 DIAGNOSIS — J310 Chronic rhinitis: Secondary | ICD-10-CM

## 2020-01-15 DIAGNOSIS — J4530 Mild persistent asthma, uncomplicated: Secondary | ICD-10-CM | POA: Diagnosis not present

## 2020-01-15 NOTE — Progress Notes (Signed)
FOLLOW UP  Date of Service/Encounter:  01/15/20   Assessment:   Mild persistent asthma without complication  Non-allergic rhinitis  Isolated elevation blood pressure - patient reports that PCP aware   Polypharmacy - especially with inhalers (we are going to address this at the next visit in one month)  Plan/Recommendations:   1. Moderate intermittent asthma, uncomplicated - Lung testing deferred today. - Bring all of your inhalers next time so that we can sort through them with you. - Daily controller medication(s): Asmanex Twisthaler 246mcg 1 puffs twice daily (DURING THE WINTER MONTHS) - Rescue medications: albuterol nebulizer one vial every 4-6 hours as needed every 4 hours as needed - Asthma control goals:  * Full participation in all desired activities (may need albuterol before activity) * Albuterol use two time or less a week on average (not counting use with activity) * Cough interfering with sleep two time or less a month * Oral steroids no more than once a year * No hospitalizations  2. Non-allergic rhinitis - Continue with: Astelin (azelastine) 2 sprays per nostril TWO TIMES daily and Atrovent (ipratropium) nasal spray 1 spray per nostril THREE TIMES DAILY  3. Return in about 4 weeks (around 02/12/2020). This can be an in-person, a virtual Webex or a telephone follow up visit.   Subjective:   Sabrina Morrison is a 86 y.o. female presenting today for follow up of  Chief Complaint  Patient presents with  . Asthma    Doing much better  . Allergic Rhinitis     Doing good need refills    Sabrina Morrison has a history of the following: Patient Active Problem List   Diagnosis Date Noted  . Urge urinary incontinence 09/09/2019  . Chronic cystitis with hematuria 09/09/2019  . Loss of weight 07/23/2018  . Mild intermittent asthma, uncomplicated 73/41/9379  . Chronic rhinitis 04/23/2018  . Anemia 08/11/2014  . Occlusion and stenosis of carotid artery without  mention of cerebral infarction 11/10/2012  . IDA (iron deficiency anemia) 05/25/2012  . HIATAL HERNIA 04/05/2010  . GASTROPARESIS 09/20/2008  . HEADACHE 09/20/2008  . HOARSENESS 09/20/2008  . NAUSEA 09/20/2008  . Esophageal dysphagia 09/20/2008  . DIARRHEA 09/20/2008  . EPIGASTRIC PAIN 09/20/2008  . SCHATZKI'S RING, HX OF 09/20/2008  . HYPOTHYROIDISM 03/08/2007  . ALLERGIC RHINITIS 03/08/2007  . ASTHMA 03/08/2007  . GERD (gastroesophageal reflux disease) 03/08/2007    History obtained from: chart review and patient.  Sabrina Morrison is a 86 y.o. female presenting for a follow up visit.  She was last seen in March 2021.  At that time, her lung testing looks stable.  She preferred Asmanex, so I tried sending that in.  For her nonallergic rhinitis, we continue with Astelin twice daily and Atrovent 3 times daily, both as needed.  We also added RyVent due to postnasal drip.  Evidently she has done very well since the heating system turned off. She had a friend that recommended that she turn off her heating system, which sie did in May and her symptoms improved. I am unclear why she had a heating system on in May. Regardless, she tells me today that she has not needed ANY of her inhalers in two months. She feels absolutely fine, although it should be noted that she has some faint expiratory wheezing bilaterally in the upper lung fields. She has a list of medications for her breathing that she would like to go through to figure out "what [she] is supposed to be on".  From a rhinitis perspective, she never did get the RyVent due to insurance coverage. She remains on the azelastine and the ipratropium which she uses as needed. She has not needed antibiotics at all.   Otherwise, there have been no changes to her past medical history, surgical history, family history, or social history.    Review of Systems  Constitutional: Negative.  Negative for chills, fever, malaise/fatigue and weight loss.  HENT:  Positive for congestion. Negative for ear discharge, ear pain and sinus pain.   Eyes: Negative for pain, discharge and redness.  Respiratory: Negative for cough, sputum production, shortness of breath and wheezing.   Cardiovascular: Negative.  Negative for chest pain and palpitations.  Gastrointestinal: Negative for abdominal pain, constipation, diarrhea, heartburn, nausea and vomiting.  Skin: Negative.  Negative for itching and rash.  Neurological: Negative for dizziness and headaches.  Endo/Heme/Allergies: Negative for environmental allergies. Does not bruise/bleed easily.       Objective:   Blood pressure (!) 160/60, pulse 73, SpO2 91 %. There is no height or weight on file to calculate BMI.   Physical Exam:  Physical Exam Constitutional:      Appearance: She is well-developed.  HENT:     Head: Normocephalic and atraumatic.     Right Ear: Tympanic membrane, ear canal and external ear normal.     Left Ear: Tympanic membrane and ear canal normal.     Nose: No nasal deformity, septal deviation, mucosal edema or rhinorrhea.     Right Sinus: No maxillary sinus tenderness or frontal sinus tenderness.     Left Sinus: No maxillary sinus tenderness or frontal sinus tenderness.     Mouth/Throat:     Mouth: Mucous membranes are not pale and not dry.     Pharynx: Uvula midline.  Eyes:     General:        Right eye: No discharge.        Left eye: No discharge.     Conjunctiva/sclera: Conjunctivae normal.     Right eye: Right conjunctiva is not injected. No chemosis.    Left eye: Left conjunctiva is not injected. No chemosis.    Pupils: Pupils are equal, round, and reactive to light.  Cardiovascular:     Rate and Rhythm: Normal rate and regular rhythm.     Heart sounds: Normal heart sounds.  Pulmonary:     Effort: Pulmonary effort is normal. No tachypnea, accessory muscle usage or respiratory distress.     Breath sounds: Normal breath sounds. No wheezing, rhonchi or rales.      Comments: Expiratory wheezing bilaterally in the upper lung fields. No increased work of breathing noted.  Chest:     Chest wall: No tenderness.  Lymphadenopathy:     Cervical: No cervical adenopathy.  Skin:    Coloration: Skin is not pale.     Findings: No abrasion, erythema, petechiae or rash. Rash is not papular, urticarial or vesicular.     Comments: No eczematous or urticarial lesions noted.   Neurological:     Mental Status: She is alert.      Diagnostic studies: none      Salvatore Marvel, MD  [facility] [place] [facility] [place]

## 2020-01-15 NOTE — Patient Instructions (Addendum)
1. Moderate intermittent asthma, uncomplicated - Lung testing deferred today. - Bring all of your inhalers next time so that we can sort through them with you. - Daily controller medication(s): Asmanex Twisthaler 279mcg 1 puffs twice daily (DURING THE WINTER MONTHS) - Rescue medications: albuterol nebulizer one vial every 4-6 hours as needed every 4 hours as needed - Asthma control goals:  * Full participation in all desired activities (may need albuterol before activity) * Albuterol use two time or less a week on average (not counting use with activity) * Cough interfering with sleep two time or less a month * Oral steroids no more than once a year * No hospitalizations  2. Non-allergic rhinitis - Continue with: Astelin (azelastine) 2 sprays per nostril TWO TIMES daily and Atrovent (ipratropium) nasal spray 1 spray per nostril THREE TIMES DAILY  3. Return in about 4 weeks (around 02/12/2020). This can be an in-person, a virtual Webex or a telephone follow up visit.   Please inform us of any Emergency Department visits, hospitalizations, or changes in symptoms. Call us before going to the ED for breathing or allergy symptoms since we might be able to fit you in for a sick visit. Feel free to contact us anytime with any questions, problems, or concerns.  It was a pleasure to see you again today!  Websites that have reliable patient information: 1. American Academy of Asthma, Allergy, and Immunology: www.aaaai.org 2. Food Allergy Research and Education (FARE): foodallergy.org 3. Mothers of Asthmatics: http://www.asthmacommunitynetwork.org 4. American College of Allergy, Asthma, and Immunology: www.acaai.org   COVID-19 Vaccine Information can be found at: ShippingScam.co.uk For questions related to vaccine distribution or appointments, please email vaccine@[email or web address] .com or call [phone])609-3818.     "Like" [place] on Facebook and  Instagram for our latest updates!        Make sure you are registered to vote! If you have moved or changed any of your contact information, you will need to get this updated before voting!  In some cases, you MAY be able to register to vote online: [email or web address]

## 2020-01-29 DIAGNOSIS — E538 Deficiency of other specified B group vitamins: Secondary | ICD-10-CM | POA: Diagnosis not present

## 2020-02-02 DIAGNOSIS — J4541 Moderate persistent asthma with (acute) exacerbation: Secondary | ICD-10-CM | POA: Diagnosis not present

## 2020-02-02 DIAGNOSIS — E7849 Other hyperlipidemia: Secondary | ICD-10-CM | POA: Diagnosis not present

## 2020-02-02 DIAGNOSIS — J449 Chronic obstructive pulmonary disease, unspecified: Secondary | ICD-10-CM | POA: Diagnosis not present

## 2020-02-02 DIAGNOSIS — I10 Essential (primary) hypertension: Secondary | ICD-10-CM | POA: Diagnosis not present

## 2020-02-09 ENCOUNTER — Encounter: Payer: Self-pay | Admitting: Internal Medicine

## 2020-02-12 DIAGNOSIS — E538 Deficiency of other specified B group vitamins: Secondary | ICD-10-CM | POA: Diagnosis not present

## 2020-02-15 DIAGNOSIS — K219 Gastro-esophageal reflux disease without esophagitis: Secondary | ICD-10-CM | POA: Diagnosis not present

## 2020-02-15 DIAGNOSIS — Z6823 Body mass index (BMI) 23.0-23.9, adult: Secondary | ICD-10-CM | POA: Diagnosis not present

## 2020-02-17 ENCOUNTER — Ambulatory Visit: Payer: Medicare HMO | Admitting: Allergy & Immunology

## 2020-02-22 DIAGNOSIS — R946 Abnormal results of thyroid function studies: Secondary | ICD-10-CM | POA: Diagnosis not present

## 2020-02-22 DIAGNOSIS — E782 Mixed hyperlipidemia: Secondary | ICD-10-CM | POA: Diagnosis not present

## 2020-02-22 DIAGNOSIS — E039 Hypothyroidism, unspecified: Secondary | ICD-10-CM | POA: Diagnosis not present

## 2020-02-22 DIAGNOSIS — K219 Gastro-esophageal reflux disease without esophagitis: Secondary | ICD-10-CM | POA: Diagnosis not present

## 2020-02-22 DIAGNOSIS — I10 Essential (primary) hypertension: Secondary | ICD-10-CM | POA: Diagnosis not present

## 2020-02-22 DIAGNOSIS — M797 Fibromyalgia: Secondary | ICD-10-CM | POA: Diagnosis not present

## 2020-02-22 DIAGNOSIS — D519 Vitamin B12 deficiency anemia, unspecified: Secondary | ICD-10-CM | POA: Diagnosis not present

## 2020-02-23 ENCOUNTER — Ambulatory Visit: Payer: Medicare HMO | Admitting: Urology

## 2020-02-24 DIAGNOSIS — K580 Irritable bowel syndrome with diarrhea: Secondary | ICD-10-CM | POA: Diagnosis not present

## 2020-02-24 DIAGNOSIS — I10 Essential (primary) hypertension: Secondary | ICD-10-CM | POA: Diagnosis not present

## 2020-02-24 DIAGNOSIS — E782 Mixed hyperlipidemia: Secondary | ICD-10-CM | POA: Diagnosis not present

## 2020-02-24 DIAGNOSIS — Z23 Encounter for immunization: Secondary | ICD-10-CM | POA: Diagnosis not present

## 2020-02-24 DIAGNOSIS — Z6823 Body mass index (BMI) 23.0-23.9, adult: Secondary | ICD-10-CM | POA: Diagnosis not present

## 2020-02-24 DIAGNOSIS — I270 Primary pulmonary hypertension: Secondary | ICD-10-CM | POA: Diagnosis not present

## 2020-02-24 DIAGNOSIS — K2100 Gastro-esophageal reflux disease with esophagitis, without bleeding: Secondary | ICD-10-CM | POA: Diagnosis not present

## 2020-02-24 DIAGNOSIS — E539 Vitamin B deficiency, unspecified: Secondary | ICD-10-CM | POA: Diagnosis not present

## 2020-03-01 ENCOUNTER — Other Ambulatory Visit: Payer: Self-pay | Admitting: Allergy & Immunology

## 2020-03-03 DIAGNOSIS — E7849 Other hyperlipidemia: Secondary | ICD-10-CM | POA: Diagnosis not present

## 2020-03-03 DIAGNOSIS — K219 Gastro-esophageal reflux disease without esophagitis: Secondary | ICD-10-CM | POA: Diagnosis not present

## 2020-03-03 DIAGNOSIS — J4541 Moderate persistent asthma with (acute) exacerbation: Secondary | ICD-10-CM | POA: Diagnosis not present

## 2020-03-03 DIAGNOSIS — I10 Essential (primary) hypertension: Secondary | ICD-10-CM | POA: Diagnosis not present

## 2020-03-03 DIAGNOSIS — J449 Chronic obstructive pulmonary disease, unspecified: Secondary | ICD-10-CM | POA: Diagnosis not present

## 2020-03-08 ENCOUNTER — Encounter: Payer: Self-pay | Admitting: Internal Medicine

## 2020-03-09 ENCOUNTER — Ambulatory Visit: Payer: Medicare HMO | Admitting: Urology

## 2020-03-11 DIAGNOSIS — E538 Deficiency of other specified B group vitamins: Secondary | ICD-10-CM | POA: Diagnosis not present

## 2020-03-25 ENCOUNTER — Ambulatory Visit: Payer: Medicare HMO | Admitting: Gastroenterology

## 2020-03-25 DIAGNOSIS — E538 Deficiency of other specified B group vitamins: Secondary | ICD-10-CM | POA: Diagnosis not present

## 2020-03-30 ENCOUNTER — Ambulatory Visit: Payer: Medicare HMO | Admitting: Allergy & Immunology

## 2020-03-30 ENCOUNTER — Other Ambulatory Visit: Payer: Self-pay

## 2020-03-30 ENCOUNTER — Encounter: Payer: Self-pay | Admitting: Allergy & Immunology

## 2020-03-30 VITALS — BP 126/64 | HR 78 | Temp 98.5°F

## 2020-03-30 DIAGNOSIS — J4531 Mild persistent asthma with (acute) exacerbation: Secondary | ICD-10-CM

## 2020-03-30 DIAGNOSIS — J310 Chronic rhinitis: Secondary | ICD-10-CM

## 2020-03-30 MED ORDER — METHYLPREDNISOLONE ACETATE 40 MG/ML IJ SUSP: 40.0000 mg | Freq: Once | INTRAMUSCULAR | Status: DC

## 2020-03-30 NOTE — Progress Notes (Signed)
FOLLOW UP  Date of Service/Encounter:  03/30/20   Assessment:   Mild persistent asthma with acute exacerbation  Non-allergic rhinitis  Isolated elevation blood pressure - patient reports that PCP aware   Plan/Recommendations:   1. Moderate intermittent asthma, uncomplicated - Lung testing looked worse today and you were not moving much air. - You shounded MUCH better with the inhaler treatment. - DepoMedrol 40mg  IM provided today in clinic. - We are going to start you on Pulmicort + albuterol nebs twice daily during the winter months.  - Daily controller medication(s):  Pulmicort 0.5mg  + albuterol nebs twice daily (DURING THE WINTER MONTHS) - Rescue medications: albuterol nebulizer one vial every 4-6 hours as needed every 4 hours as needed - Asthma control goals:  * Full participation in all desired activities (may need albuterol before activity) * Albuterol use two time or less a week on average (not counting use with activity) * Cough interfering with sleep two time or less a month * Oral steroids no more than once a year * No hospitalizations  2. Non-allergic rhinitis - Continue with: Astelin (azelastine) 2 sprays per nostril TWO TIMES daily and Atrovent (ipratropium) nasal spray 1 spray per nostril THREE TIMES DAILY - Restart levocetirizine 5 mg daily.   3. Return in about 4 months (around 07/31/2020).   Subjective:   Sabrina Morrison is a 86 y.o. female presenting today for follow up of  Chief Complaint  Patient presents with  . Allergic Rhinitis     coughing, sneezing, runny nose     Sabrina Morrison has a history of the following: Patient Active Problem List   Diagnosis Date Noted  . Urge urinary incontinence 09/09/2019  . Chronic cystitis with hematuria 09/09/2019  . Loss of weight 07/23/2018  . Mild intermittent asthma, uncomplicated 22/29/7989  . Chronic rhinitis 04/23/2018  . Anemia 08/11/2014  . Occlusion and stenosis of carotid artery without  mention of cerebral infarction 11/10/2012  . IDA (iron deficiency anemia) 05/25/2012  . HIATAL HERNIA 04/05/2010  . GASTROPARESIS 09/20/2008  . HEADACHE 09/20/2008  . HOARSENESS 09/20/2008  . NAUSEA 09/20/2008  . Esophageal dysphagia 09/20/2008  . DIARRHEA 09/20/2008  . EPIGASTRIC PAIN 09/20/2008  . SCHATZKI'S RING, HX OF 09/20/2008  . HYPOTHYROIDISM 03/08/2007  . ALLERGIC RHINITIS 03/08/2007  . ASTHMA 03/08/2007  . GERD (gastroesophageal reflux disease) 03/08/2007    History obtained from: chart review and patient.  Sabrina Morrison is a 86 y.o. female presenting for a follow up visit.  She was last seen in August 2021.  At that time, we did not do lab testing.  She was confused about which medication she needed to be on, so we recommended bringing all of her inhalers and next time so we could sort through that it.  We continued with Asmanex Twisthaler 220 mcg 1 puff twice daily during the winter months only as well as albuterol as needed.  For her nonallergic rhinitis, we continued with Astelin as well as Atrovent.  Since the last visit, she reports that she has been doing fairly well. However, she reports that she has been coughing and sneezing for one month. She has been using her rescue inhaler more often than note. She does have the Astelin and the ipratropium daily. Neither seems to be helping completely.   Asthma/Respiratory Symptom History: She is not using the Asmanex at all. She did not feel that it was helping at all. She has not been to the ED For her breathing problems. She  is sleeping well at night. She has not been using her albuterol at all.   Allergic Rhinitis Symptom History: She does not usually have issues this time of the year.She thinks she was on levocetirizine at one point, but she is not longer taking this. She needs refills. She has not needed antibiotics at all.   Otherwise, there have been no changes to her past medical history, surgical history, family history, or social  history.    Review of Systems  Constitutional: Negative.  Negative for chills, fever, malaise/fatigue and weight loss.  HENT: Positive for congestion. Negative for ear discharge, ear pain, sinus pain and sore throat.   Eyes: Negative for pain, discharge and redness.  Respiratory: Positive for cough and shortness of breath. Negative for sputum production and wheezing.   Cardiovascular: Negative.  Negative for chest pain and palpitations.  Gastrointestinal: Negative for abdominal pain, constipation, diarrhea, heartburn, nausea and vomiting.  Skin: Negative.  Negative for itching and rash.  Neurological: Negative for dizziness and headaches.  Endo/Heme/Allergies: Positive for environmental allergies. Does not bruise/bleed easily.       Objective:   Blood pressure 126/64, pulse 78, temperature 98.5 F (36.9 C), SpO2 92 %. There is no height or weight on file to calculate BMI.   Physical Exam:  Physical Exam Constitutional:      Appearance: She is well-developed.  HENT:     Head: Normocephalic and atraumatic.     Right Ear: Tympanic membrane, ear canal and external ear normal.     Left Ear: Tympanic membrane, ear canal and external ear normal.     Nose: No nasal deformity, septal deviation, mucosal edema or rhinorrhea.     Right Sinus: No maxillary sinus tenderness or frontal sinus tenderness.     Left Sinus: No maxillary sinus tenderness or frontal sinus tenderness.     Comments: No nasal polyps present.     Mouth/Throat:     Mouth: Mucous membranes are not pale and not dry.     Pharynx: Uvula midline.  Eyes:     General: Allergic shiner present.        Right eye: No discharge.        Left eye: No discharge.     Conjunctiva/sclera: Conjunctivae normal.     Right eye: Right conjunctiva is not injected. No chemosis.    Left eye: Left conjunctiva is not injected. No chemosis.    Pupils: Pupils are equal, round, and reactive to light.  Cardiovascular:     Rate and Rhythm:  Normal rate and regular rhythm.     Heart sounds: Normal heart sounds.  Pulmonary:     Effort: Pulmonary effort is normal. No tachypnea, accessory muscle usage or respiratory distress.     Breath sounds: Normal breath sounds. No wheezing, rhonchi or rales.     Comments: Decreased air movement at the bases. No crackles or wheezes noted, but it would be difficult to hear any wheezing or crackles with this decreased air movement.  Chest:     Chest wall: No tenderness.  Lymphadenopathy:     Cervical: No cervical adenopathy.  Skin:    Coloration: Skin is not pale.     Findings: No abrasion, erythema, petechiae or rash. Rash is not papular, urticarial or vesicular.     Comments: No eczematous or urticarial lesions noted.   Neurological:     Mental Status: She is alert.      Diagnostic studies:    Spirometry: results abnormal (FEV1: 1.06/50%, FVC:  1.16/41%, FEV1/FVC: 91%).    Spirometry consistent with possible restrictive disease. Xopenex four puffs via MDI treatment given in clinic with improvement in FVC, but not significant per ATS criteria.  Allergy Studies: none     Salvatore Marvel, MD  [facility]

## 2020-03-30 NOTE — Patient Instructions (Addendum)
1. Moderate intermittent asthma, uncomplicated - Lung testing looked worse today and you were not moving much air. - You shounded MUCH better with the inhaler treatment. - DepoMedrol 40mg  IM provided today in clinic. - We are going to start you on Pulmicort + albuterol nebs twice daily during the winter months.  - Daily controller medication(s):  Pulmicort 0.5mg  + albuterol nebs twice daily (DURING THE WINTER MONTHS) - Rescue medications: albuterol nebulizer one vial every 4-6 hours as needed every 4 hours as needed - Asthma control goals:  * Full participation in all desired activities (may need albuterol before activity) * Albuterol use two time or less a week on average (not counting use with activity) * Cough interfering with sleep two time or less a month * Oral steroids no more than once a year * No hospitalizations  2. Non-allergic rhinitis - Continue with: Astelin (azelastine) 2 sprays per nostril TWO TIMES daily and Atrovent (ipratropium) nasal spray 1 spray per nostril THREE TIMES DAILY - Restart levocetirizine 5 mg daily.   3. Return in about 4 months (around 07/31/2020).    Please inform us of any Emergency Department visits, hospitalizations, or changes in symptoms. Call us before going to the ED for breathing or allergy symptoms since we might be able to fit you in for a sick visit. Feel free to contact us anytime with any questions, problems, or concerns.  It was a pleasure to see you again today!  Websites that have reliable patient information: 1. American Academy of Asthma, Allergy, and Immunology: www.aaaai.org 2. Food Allergy Research and Education (FARE): foodallergy.org 3. Mothers of Asthmatics: http://www.asthmacommunitynetwork.org 4. American College of Allergy, Asthma, and Immunology: www.acaai.org   COVID-19 Vaccine Information can be found at: ShippingScam.co.uk For questions related to vaccine  distribution or appointments, please email [email or web address] or call [phone].     "Like" [place] on Facebook and Instagram for our latest updates!     HAPPY FALL!     Make sure you are registered to vote! If you have moved or changed any of your contact information, you will need to get this updated before voting!  In some cases, you MAY be able to register to vote online: [email or web address]

## 2020-03-31 DIAGNOSIS — Z8781 Personal history of (healed) traumatic fracture: Secondary | ICD-10-CM | POA: Diagnosis not present

## 2020-03-31 DIAGNOSIS — M25562 Pain in left knee: Secondary | ICD-10-CM | POA: Diagnosis not present

## 2020-03-31 DIAGNOSIS — Z6823 Body mass index (BMI) 23.0-23.9, adult: Secondary | ICD-10-CM | POA: Diagnosis not present

## 2020-03-31 DIAGNOSIS — M25552 Pain in left hip: Secondary | ICD-10-CM | POA: Diagnosis not present

## 2020-04-01 ENCOUNTER — Ambulatory Visit: Payer: Medicare HMO | Admitting: Gastroenterology

## 2020-04-01 DIAGNOSIS — J4531 Mild persistent asthma with (acute) exacerbation: Secondary | ICD-10-CM | POA: Diagnosis not present

## 2020-04-01 MED ORDER — METHYLPREDNISOLONE ACETATE 80 MG/ML IJ SUSP
40.0000 mg | Freq: Once | INTRAMUSCULAR | Status: AC
  Administered 2020-04-01: 40 mg via INTRAMUSCULAR

## 2020-04-01 NOTE — Addendum Note (Signed)
Addended by: Lucrezia Starch I on: 04/01/2020 09:10 AM   Modules accepted: Orders

## 2020-04-01 NOTE — Addendum Note (Signed)
Addended by: Farrel Demark R on: 04/01/2020 10:12 AM   Modules accepted: Orders

## 2020-04-02 DIAGNOSIS — I10 Essential (primary) hypertension: Secondary | ICD-10-CM | POA: Diagnosis not present

## 2020-04-02 DIAGNOSIS — J4541 Moderate persistent asthma with (acute) exacerbation: Secondary | ICD-10-CM | POA: Diagnosis not present

## 2020-04-02 DIAGNOSIS — E7849 Other hyperlipidemia: Secondary | ICD-10-CM | POA: Diagnosis not present

## 2020-04-02 DIAGNOSIS — J449 Chronic obstructive pulmonary disease, unspecified: Secondary | ICD-10-CM | POA: Diagnosis not present

## 2020-04-08 DIAGNOSIS — E538 Deficiency of other specified B group vitamins: Secondary | ICD-10-CM | POA: Diagnosis not present

## 2020-04-11 ENCOUNTER — Ambulatory Visit: Payer: Medicare HMO | Admitting: Urology

## 2020-04-14 DIAGNOSIS — Z6823 Body mass index (BMI) 23.0-23.9, adult: Secondary | ICD-10-CM | POA: Diagnosis not present

## 2020-04-14 DIAGNOSIS — J4540 Moderate persistent asthma, uncomplicated: Secondary | ICD-10-CM | POA: Diagnosis not present

## 2020-04-14 DIAGNOSIS — N3281 Overactive bladder: Secondary | ICD-10-CM | POA: Diagnosis not present

## 2020-04-18 ENCOUNTER — Other Ambulatory Visit: Payer: Self-pay | Admitting: Allergy & Immunology

## 2020-04-19 ENCOUNTER — Ambulatory Visit: Payer: Medicare HMO | Admitting: Gastroenterology

## 2020-04-21 DIAGNOSIS — E538 Deficiency of other specified B group vitamins: Secondary | ICD-10-CM | POA: Diagnosis not present

## 2020-04-25 ENCOUNTER — Ambulatory Visit: Payer: Medicare HMO | Admitting: Urology

## 2020-04-25 ENCOUNTER — Encounter: Payer: Self-pay | Admitting: Gastroenterology

## 2020-04-25 ENCOUNTER — Ambulatory Visit: Payer: Medicare HMO | Admitting: Gastroenterology

## 2020-04-25 ENCOUNTER — Other Ambulatory Visit: Payer: Self-pay

## 2020-04-25 VITALS — BP 157/71 | HR 67 | Temp 97.8°F | Ht 62.0 in | Wt 128.6 lb

## 2020-04-25 DIAGNOSIS — K219 Gastro-esophageal reflux disease without esophagitis: Secondary | ICD-10-CM | POA: Diagnosis not present

## 2020-04-25 NOTE — Progress Notes (Signed)
Primary Care Physician: Caryl Bis, MD  Primary Gastroenterologist:  Garfield Cornea, MD   Chief Complaint  Patient presents with  . Gastroesophageal Reflux    doing okay    HPI: Sabrina Morrison is a 86 y.o. female here for follow-up of reflux.  Last seen in July 2021.  Last EGD in 2014 with corkscrewed tubular esophagus, hiatal hernia, small bowel biopsy negative for celiac.  At time of her last office visit she was having refractory reflux symptoms.  We increased her pantoprazole to twice daily with plan to consider upper endoscopy if persistent symptoms.  However today it appears that patient never increased the pantoprazole still taking once daily.  For the most part she does pretty good.  There are a few days a week that she will have heartburn and or nausea and on those days she will take to bismuth subsalicylate and 1 Zofran and her symptoms will settle down.  She denies vomiting.  Denies abdominal pain.  Bowel movements are regular.  No blood in stool or melena. Weight is down 12 pounds since April 2021.  Only 5 pounds down since July however.      Current Outpatient Medications  Medication Sig Dispense Refill  . acetaminophen (TYLENOL) 500 MG tablet Take 1,000 mg by mouth in the morning and at bedtime. pain    . albuterol (VENTOLIN HFA) 108 (90 Base) MCG/ACT inhaler Inhale 2 puffs into the lungs every 4 (four) hours as needed for wheezing or shortness of breath.    Marland Kitchen azelastine (ASTELIN) 0.1 % nasal spray USE 2 SPRAYS IN EACH NOSTRIL TWICE DAILY 30 mL 5  . Bismuth 262 MG CHEW Chew by mouth. As needed    . budesonide (PULMICORT) 0.5 MG/2ML nebulizer solution Take 0.5 mg by nebulization 2 (two) times daily.    . diphenoxylate-atropine (LOMOTIL) 2.5-0.025 MG per tablet Take 1 tablet by mouth 4 (four) times daily as needed. diarrhea    . fluticasone (FLOVENT HFA) 110 MCG/ACT inhaler Inhale 2 puffs into the lungs 2 (two) times daily.    Marland Kitchen ipratropium (ATROVENT) 0.03 % nasal  spray Place 1 spray into both nostrils in the morning, at noon, and at bedtime. 30 mL 5  . levocetirizine (XYZAL) 5 MG tablet Take 5 mg by mouth every evening.     Marland Kitchen levothyroxine (SYNTHROID) 100 MCG tablet Take 100 mcg by mouth daily before breakfast. Name brand ONLY    . ondansetron (ZOFRAN) 8 MG tablet Take 4-8 mg by mouth every 8 (eight) hours as needed for nausea or vomiting.    . pantoprazole (PROTONIX) 40 MG tablet Take 40 mg by mouth 2 (two) times daily before a meal.    . promethazine (PHENERGAN) 12.5 MG tablet Take 12.5 mg by mouth every 8 (eight) hours as needed.     . traMADol (ULTRAM) 50 MG tablet Take 50 mg by mouth as needed.     . Fluticasone Furoate (ARNUITY ELLIPTA IN) Inhale into the lungs.     No current facility-administered medications for this visit.    Allergies as of 04/25/2020 - Review Complete 04/25/2020  Allergen Reaction Noted  . Codeine Nausea And Vomiting   . Coumadin [warfarin]  06/19/2012  . Dexlansoprazole  04/05/2010  . Erythromycin ethylsuccinate  04/05/2010  . Esomeprazole magnesium Nausea And Vomiting   . Moxifloxacin Nausea And Vomiting 04/05/2010  . Omeprazole  04/05/2010  . Sulfonamide derivatives Nausea And Vomiting     ROS:  General: Negative  for anorexia,  fever, chills, fatigue, weakness.  See HPI ENT: Negative for hoarseness, difficulty swallowing , nasal congestion. CV: Negative for chest pain, angina, palpitations, dyspnea on exertion, peripheral edema.  Respiratory: Negative for dyspnea at rest, dyspnea on exertion, cough, sputum, wheezing.  GI: See history of present illness. GU:  Negative for dysuria, hematuria, urinary incontinence, urinary frequency, nocturnal urination.  Endo: See HPI.    Physical Examination:   BP (!) 157/71   Pulse 67   Temp 97.8 F (36.6 C)   Ht 5\' 2"  (1.575 m)   Wt 128 lb 9.6 oz (58.3 kg)   BMI 23.52 kg/m   General: Well-nourished, well-developed in no acute distress.  Eyes: No icterus. Mouth:  masked. Lungs: Clear to auscultation bilaterally.  Heart: Regular rate and rhythm, no murmurs rubs or gallops.  Abdomen: Bowel sounds are normal, nontender, nondistended, no hepatosplenomegaly or masses, no abdominal bruits or hernia , no rebound or guarding.   Extremities: No lower extremity edema. No clubbing or deformities. Neuro: Alert and oriented x 4   Skin: Warm and dry, no jaundice.   Psych: Alert and cooperative, normal mood and affect.  Impression/Plan:  86 year old female with chronic GERD presenting for follow-up.  Overall her symptoms are adequately controlled.  She did not increase pantoprazole to twice daily as recommended at last office visit.  She remains on pantoprazole 40 mg daily before breakfast.  She has found the best regimen to control her refractory heartburn and nausea when it occurs is two bismuth subsalicylate tablets along with 1 Zofran.  She does not have to do this daily.  She is satisfied with the way symptoms are controlled.  We discussed weight loss as outlined.  Would like her to monitor her weight for any further loss, appears that it has slowed down since July.  If she loses more than 5 more pounds she will let us know.  Otherwise we will see her back in 6 months.

## 2020-04-25 NOTE — Patient Instructions (Signed)
1. Continue pantoprazole once daily before breakfast for reflux. 2. If you notice worsening nausea, please let us know. 3. Keep your eye on your weight and if you lose more than 5 pounds, let us know.   4. Otherwise we will see you back in 6 months.

## 2020-04-29 DIAGNOSIS — J189 Pneumonia, unspecified organism: Secondary | ICD-10-CM | POA: Diagnosis not present

## 2020-04-29 DIAGNOSIS — Z6823 Body mass index (BMI) 23.0-23.9, adult: Secondary | ICD-10-CM | POA: Diagnosis not present

## 2020-05-03 DIAGNOSIS — J4541 Moderate persistent asthma with (acute) exacerbation: Secondary | ICD-10-CM | POA: Diagnosis not present

## 2020-05-03 DIAGNOSIS — I10 Essential (primary) hypertension: Secondary | ICD-10-CM | POA: Diagnosis not present

## 2020-05-03 DIAGNOSIS — J449 Chronic obstructive pulmonary disease, unspecified: Secondary | ICD-10-CM | POA: Diagnosis not present

## 2020-05-03 DIAGNOSIS — E7849 Other hyperlipidemia: Secondary | ICD-10-CM | POA: Diagnosis not present

## 2020-05-04 DIAGNOSIS — E538 Deficiency of other specified B group vitamins: Secondary | ICD-10-CM | POA: Diagnosis not present

## 2020-05-06 DIAGNOSIS — Z6822 Body mass index (BMI) 22.0-22.9, adult: Secondary | ICD-10-CM | POA: Diagnosis not present

## 2020-05-06 DIAGNOSIS — R1013 Epigastric pain: Secondary | ICD-10-CM | POA: Diagnosis not present

## 2020-05-06 DIAGNOSIS — K2100 Gastro-esophageal reflux disease with esophagitis, without bleeding: Secondary | ICD-10-CM | POA: Diagnosis not present

## 2020-05-12 DIAGNOSIS — R059 Cough, unspecified: Secondary | ICD-10-CM | POA: Diagnosis not present

## 2020-05-20 DIAGNOSIS — E538 Deficiency of other specified B group vitamins: Secondary | ICD-10-CM | POA: Diagnosis not present

## 2020-05-20 DIAGNOSIS — R062 Wheezing: Secondary | ICD-10-CM | POA: Diagnosis not present

## 2020-05-20 DIAGNOSIS — J301 Allergic rhinitis due to pollen: Secondary | ICD-10-CM | POA: Diagnosis not present

## 2020-05-25 ENCOUNTER — Ambulatory Visit: Payer: Medicare HMO | Admitting: Urology

## 2020-06-02 DIAGNOSIS — Z6822 Body mass index (BMI) 22.0-22.9, adult: Secondary | ICD-10-CM | POA: Diagnosis not present

## 2020-06-02 DIAGNOSIS — E538 Deficiency of other specified B group vitamins: Secondary | ICD-10-CM | POA: Diagnosis not present

## 2020-06-02 DIAGNOSIS — R21 Rash and other nonspecific skin eruption: Secondary | ICD-10-CM | POA: Diagnosis not present

## 2020-06-03 DIAGNOSIS — I10 Essential (primary) hypertension: Secondary | ICD-10-CM | POA: Diagnosis not present

## 2020-06-03 DIAGNOSIS — J449 Chronic obstructive pulmonary disease, unspecified: Secondary | ICD-10-CM | POA: Diagnosis not present

## 2020-06-03 DIAGNOSIS — E7849 Other hyperlipidemia: Secondary | ICD-10-CM | POA: Diagnosis not present

## 2020-06-03 DIAGNOSIS — J4541 Moderate persistent asthma with (acute) exacerbation: Secondary | ICD-10-CM | POA: Diagnosis not present

## 2020-06-08 DIAGNOSIS — L03211 Cellulitis of face: Secondary | ICD-10-CM | POA: Diagnosis not present

## 2020-06-08 DIAGNOSIS — L309 Dermatitis, unspecified: Secondary | ICD-10-CM | POA: Diagnosis not present

## 2020-06-08 DIAGNOSIS — Z6823 Body mass index (BMI) 23.0-23.9, adult: Secondary | ICD-10-CM | POA: Diagnosis not present

## 2020-06-13 DIAGNOSIS — L0100 Impetigo, unspecified: Secondary | ICD-10-CM | POA: Diagnosis not present

## 2020-06-13 DIAGNOSIS — L280 Lichen simplex chronicus: Secondary | ICD-10-CM | POA: Diagnosis not present

## 2020-06-13 DIAGNOSIS — L309 Dermatitis, unspecified: Secondary | ICD-10-CM | POA: Diagnosis not present

## 2020-06-16 DIAGNOSIS — E538 Deficiency of other specified B group vitamins: Secondary | ICD-10-CM | POA: Diagnosis not present

## 2020-06-23 DIAGNOSIS — D23121 Other benign neoplasm of skin of left upper eyelid, including canthus: Secondary | ICD-10-CM | POA: Diagnosis not present

## 2020-06-23 DIAGNOSIS — L309 Dermatitis, unspecified: Secondary | ICD-10-CM | POA: Diagnosis not present

## 2020-06-30 DIAGNOSIS — E538 Deficiency of other specified B group vitamins: Secondary | ICD-10-CM | POA: Diagnosis not present

## 2020-06-30 DIAGNOSIS — M4692 Unspecified inflammatory spondylopathy, cervical region: Secondary | ICD-10-CM | POA: Diagnosis not present

## 2020-06-30 DIAGNOSIS — M19019 Primary osteoarthritis, unspecified shoulder: Secondary | ICD-10-CM | POA: Diagnosis not present

## 2020-06-30 DIAGNOSIS — R293 Abnormal posture: Secondary | ICD-10-CM | POA: Diagnosis not present

## 2020-06-30 DIAGNOSIS — M542 Cervicalgia: Secondary | ICD-10-CM | POA: Diagnosis not present

## 2020-07-06 ENCOUNTER — Ambulatory Visit: Payer: Medicare HMO | Admitting: Urology

## 2020-07-06 DIAGNOSIS — M19019 Primary osteoarthritis, unspecified shoulder: Secondary | ICD-10-CM | POA: Diagnosis not present

## 2020-07-06 DIAGNOSIS — R293 Abnormal posture: Secondary | ICD-10-CM | POA: Diagnosis not present

## 2020-07-06 DIAGNOSIS — M25519 Pain in unspecified shoulder: Secondary | ICD-10-CM | POA: Diagnosis not present

## 2020-07-06 DIAGNOSIS — M542 Cervicalgia: Secondary | ICD-10-CM | POA: Diagnosis not present

## 2020-07-06 DIAGNOSIS — M4692 Unspecified inflammatory spondylopathy, cervical region: Secondary | ICD-10-CM | POA: Diagnosis not present

## 2020-07-07 DIAGNOSIS — Z6823 Body mass index (BMI) 23.0-23.9, adult: Secondary | ICD-10-CM | POA: Diagnosis not present

## 2020-07-07 DIAGNOSIS — R32 Unspecified urinary incontinence: Secondary | ICD-10-CM | POA: Diagnosis not present

## 2020-07-07 DIAGNOSIS — R350 Frequency of micturition: Secondary | ICD-10-CM | POA: Diagnosis not present

## 2020-07-07 DIAGNOSIS — R300 Dysuria: Secondary | ICD-10-CM | POA: Diagnosis not present

## 2020-07-12 DIAGNOSIS — R293 Abnormal posture: Secondary | ICD-10-CM | POA: Diagnosis not present

## 2020-07-12 DIAGNOSIS — M19019 Primary osteoarthritis, unspecified shoulder: Secondary | ICD-10-CM | POA: Diagnosis not present

## 2020-07-12 DIAGNOSIS — M4692 Unspecified inflammatory spondylopathy, cervical region: Secondary | ICD-10-CM | POA: Diagnosis not present

## 2020-07-12 DIAGNOSIS — M25519 Pain in unspecified shoulder: Secondary | ICD-10-CM | POA: Diagnosis not present

## 2020-07-12 DIAGNOSIS — M542 Cervicalgia: Secondary | ICD-10-CM | POA: Diagnosis not present

## 2020-07-15 DIAGNOSIS — E538 Deficiency of other specified B group vitamins: Secondary | ICD-10-CM | POA: Diagnosis not present

## 2020-07-20 DIAGNOSIS — L280 Lichen simplex chronicus: Secondary | ICD-10-CM | POA: Diagnosis not present

## 2020-07-20 DIAGNOSIS — L304 Erythema intertrigo: Secondary | ICD-10-CM | POA: Diagnosis not present

## 2020-07-29 DIAGNOSIS — E538 Deficiency of other specified B group vitamins: Secondary | ICD-10-CM | POA: Diagnosis not present

## 2020-08-01 DIAGNOSIS — J4541 Moderate persistent asthma with (acute) exacerbation: Secondary | ICD-10-CM | POA: Diagnosis not present

## 2020-08-01 DIAGNOSIS — J449 Chronic obstructive pulmonary disease, unspecified: Secondary | ICD-10-CM | POA: Diagnosis not present

## 2020-08-01 DIAGNOSIS — E7849 Other hyperlipidemia: Secondary | ICD-10-CM | POA: Diagnosis not present

## 2020-08-01 DIAGNOSIS — I10 Essential (primary) hypertension: Secondary | ICD-10-CM | POA: Diagnosis not present

## 2020-08-03 ENCOUNTER — Encounter: Payer: Self-pay | Admitting: Allergy & Immunology

## 2020-08-03 ENCOUNTER — Other Ambulatory Visit: Payer: Self-pay

## 2020-08-03 ENCOUNTER — Telehealth: Payer: Self-pay

## 2020-08-03 ENCOUNTER — Ambulatory Visit: Payer: Medicare HMO | Admitting: Allergy & Immunology

## 2020-08-03 VITALS — BP 110/62 | HR 63 | Temp 98.3°F | Resp 17 | Ht 62.99 in | Wt 127.2 lb

## 2020-08-03 DIAGNOSIS — J310 Chronic rhinitis: Secondary | ICD-10-CM | POA: Diagnosis not present

## 2020-08-03 DIAGNOSIS — J4540 Moderate persistent asthma, uncomplicated: Secondary | ICD-10-CM | POA: Diagnosis not present

## 2020-08-03 MED ORDER — CARBINOXAMINE MALEATE 4 MG PO TABS: 4.0000 mg | ORAL_TABLET | Freq: Two times a day (BID) | ORAL | 5 refills | Status: DC

## 2020-08-03 NOTE — Patient Instructions (Addendum)
1. Moderate intermittent asthma, uncomplicated - Lung testing looked stable today. - We are not going to make any changes, but I need you to restart your medications.  - Use them throughout the year for now.  - Daily controller medication(s):  Pulmicort 0.5mg  + albuterol nebs twice daily - Rescue medications: albuterol nebulizer one vial every 4-6 hours as needed every 4 hours as needed - Asthma control goals:  * Full participation in all desired activities (may need albuterol before activity) * Albuterol use two time or less a week on average (not counting use with activity) * Cough interfering with sleep two time or less a month * Oral steroids no more than once a year * No hospitalizations  2. Non-allergic rhinitis - Continue with: Astelin (azelastine) 2 sprays per nostril TWO TIMES daily and Atrovent (ipratropium) nasal spray 1 spray per nostril THREE TIMES DAILY - We are going to start carbinoxamine 4mg  twice daily to help dry things up. - You can wean off of your nasal sprays once you have the carbinoxamine in your system.  3. Return in about 3 months (around 11/03/2020).    Please inform us of any Emergency Department visits, hospitalizations, or changes in symptoms. Call us before going to the ED for breathing or allergy symptoms since we might be able to fit you in for a sick visit. Feel free to contact us anytime with any questions, problems, or concerns.  It was a pleasure to see you again today!  Websites that have reliable patient information: 1. American Academy of Asthma, Allergy, and Immunology: www.aaaai.org 2. Food Allergy Research and Education (FARE): foodallergy.org 3. Mothers of Asthmatics: http://www.asthmacommunitynetwork.org 4. American College of Allergy, Asthma, and Immunology: www.acaai.org   COVID-19 Vaccine Information can be found at: ShippingScam.co.uk For questions related to vaccine distribution  or appointments, please email [email or web address] or call [phone].   We realize that you might be concerned about having an allergic reaction to the COVID19 vaccines. To help with that concern, WE ARE OFFERING THE COVID19 VACCINES IN OUR OFFICE! Ask the front desk for dates!     "Like" [place] on Facebook and Instagram for our latest updates!      A healthy democracy works best when [employer] participate! Make sure you are registered to vote! If you have moved or changed any of your contact information, you will need to get this updated before voting!  In some cases, you MAY be able to register to vote online: [email or web address]
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
## 2020-08-03 NOTE — Telephone Encounter (Signed)
Carbinoxamine Maleate 4 MG TABS isn't covered by the patients insurance.  Lonn [place] can we do a PA?

## 2020-08-04 NOTE — Telephone Encounter (Signed)
Patient called back for an updated regarding her prescription. Patient wants to know if we are switching to Zyrtec or doing a Prior Auth.  Please Advise.

## 2020-08-04 NOTE — Telephone Encounter (Signed)
PA was attempted on 08-03-20 but was not able to process as the patient was not located under the plan. I am looking into it.

## 2020-08-05 MED ORDER — CHLORPHENIRAMINE MALEATE 4 MG PO TABS: 4.0000 mg | ORAL_TABLET | Freq: Two times a day (BID) | ORAL | 5 refills | Status: DC | PRN

## 2020-08-05 NOTE — Telephone Encounter (Signed)
I would recommend that she try chlorpheniramine every 6 hours. I will try sending that in.   Salvatore Marvel, MD [facility]

## 2020-08-05 NOTE — Addendum Note (Signed)
Addended by: Valentina Shaggy on: 08/05/2020 04:33 PM   Modules accepted: Orders

## 2020-08-05 NOTE — Telephone Encounter (Signed)
I was finally able to get this submitted on covermymeds. Pending at this time.

## 2020-08-05 NOTE — Telephone Encounter (Signed)
The patient's insurance will not cover the carbinoxamine. No alternatives given.

## 2020-08-05 NOTE — Telephone Encounter (Signed)
Patient informed of medication change and reason for the change.

## 2020-08-12 DIAGNOSIS — E538 Deficiency of other specified B group vitamins: Secondary | ICD-10-CM | POA: Diagnosis not present

## 2020-08-17 DIAGNOSIS — E539 Vitamin B deficiency, unspecified: Secondary | ICD-10-CM | POA: Diagnosis not present

## 2020-08-17 DIAGNOSIS — I358 Other nonrheumatic aortic valve disorders: Secondary | ICD-10-CM | POA: Diagnosis not present

## 2020-08-17 DIAGNOSIS — M797 Fibromyalgia: Secondary | ICD-10-CM | POA: Diagnosis not present

## 2020-08-17 DIAGNOSIS — E7849 Other hyperlipidemia: Secondary | ICD-10-CM | POA: Diagnosis not present

## 2020-08-17 DIAGNOSIS — I270 Primary pulmonary hypertension: Secondary | ICD-10-CM | POA: Diagnosis not present

## 2020-08-17 DIAGNOSIS — J301 Allergic rhinitis due to pollen: Secondary | ICD-10-CM | POA: Diagnosis not present

## 2020-08-17 DIAGNOSIS — E782 Mixed hyperlipidemia: Secondary | ICD-10-CM | POA: Diagnosis not present

## 2020-08-17 DIAGNOSIS — D692 Other nonthrombocytopenic purpura: Secondary | ICD-10-CM | POA: Diagnosis not present

## 2020-08-17 DIAGNOSIS — I10 Essential (primary) hypertension: Secondary | ICD-10-CM | POA: Diagnosis not present

## 2020-08-17 DIAGNOSIS — R4582 Worries: Secondary | ICD-10-CM | POA: Diagnosis not present

## 2020-08-17 DIAGNOSIS — Z0001 Encounter for general adult medical examination with abnormal findings: Secondary | ICD-10-CM | POA: Diagnosis not present

## 2020-08-17 DIAGNOSIS — K580 Irritable bowel syndrome with diarrhea: Secondary | ICD-10-CM | POA: Diagnosis not present

## 2020-08-17 DIAGNOSIS — M545 Low back pain, unspecified: Secondary | ICD-10-CM | POA: Diagnosis not present

## 2020-08-24 ENCOUNTER — Ambulatory Visit: Payer: Medicare HMO | Admitting: Urology

## 2020-08-26 DIAGNOSIS — E538 Deficiency of other specified B group vitamins: Secondary | ICD-10-CM | POA: Diagnosis not present

## 2020-08-31 DIAGNOSIS — J4541 Moderate persistent asthma with (acute) exacerbation: Secondary | ICD-10-CM | POA: Diagnosis not present

## 2020-08-31 DIAGNOSIS — E7849 Other hyperlipidemia: Secondary | ICD-10-CM | POA: Diagnosis not present

## 2020-08-31 DIAGNOSIS — J449 Chronic obstructive pulmonary disease, unspecified: Secondary | ICD-10-CM | POA: Diagnosis not present

## 2020-08-31 DIAGNOSIS — I10 Essential (primary) hypertension: Secondary | ICD-10-CM | POA: Diagnosis not present

## 2020-09-09 DIAGNOSIS — Z23 Encounter for immunization: Secondary | ICD-10-CM | POA: Diagnosis not present

## 2020-09-09 DIAGNOSIS — E538 Deficiency of other specified B group vitamins: Secondary | ICD-10-CM | POA: Diagnosis not present

## 2020-09-16 ENCOUNTER — Other Ambulatory Visit: Payer: Self-pay | Admitting: Allergy & Immunology

## 2020-09-20 ENCOUNTER — Other Ambulatory Visit: Payer: Self-pay

## 2020-09-20 ENCOUNTER — Ambulatory Visit: Payer: Medicare HMO | Admitting: Gastroenterology

## 2020-09-20 ENCOUNTER — Encounter: Payer: Self-pay | Admitting: Gastroenterology

## 2020-09-20 VITALS — BP 160/80 | HR 73 | Temp 96.9°F | Ht 62.0 in | Wt 126.8 lb

## 2020-09-20 DIAGNOSIS — K219 Gastro-esophageal reflux disease without esophagitis: Secondary | ICD-10-CM

## 2020-09-20 DIAGNOSIS — Z6823 Body mass index (BMI) 23.0-23.9, adult: Secondary | ICD-10-CM | POA: Diagnosis not present

## 2020-09-20 DIAGNOSIS — J4541 Moderate persistent asthma with (acute) exacerbation: Secondary | ICD-10-CM | POA: Diagnosis not present

## 2020-09-20 MED ORDER — RABEPRAZOLE SODIUM 20 MG PO TBEC: 20.0000 mg | DELAYED_RELEASE_TABLET | Freq: Every day | ORAL | 3 refills | Status: DC

## 2020-09-20 MED ORDER — RABEPRAZOLE SODIUM 20 MG PO TBEC: 20.0000 mg | DELAYED_RELEASE_TABLET | Freq: Two times a day (BID) | ORAL | 3 refills | Status: DC

## 2020-09-20 NOTE — Patient Instructions (Addendum)
1. Please stop omeprazole. Start rabeprazole 20mg  twice daily before breakfast and evening meal.  2. Call in 2-3 weeks and let me know how your are doing. If your heartburn and abdominal pain does not improve, you will need an upper endoscopy. 3. Return to the office in six months.

## 2020-09-20 NOTE — Progress Notes (Signed)
Primary Care Physician: Caryl Bis, MD  Primary Gastroenterologist:  Garfield Cornea, MD   Chief Complaint  Patient presents with  . Gastroesophageal Reflux    HPI: Sabrina Morrison is a 87 y.o. female here for follow-up of reflux.  Last seen in November 2021. Last EGD in 2014 with corkscrewed tubular esophagus, hiatal hernia, small bowel biopsy negative for celiac.  At time of her last office visit she was doing well on pantoprazole 40 mg daily, she was supposed to be on twice daily but she never increased her dose.  Patient states since then she saw her PCP, was having epigastric pain/burning. Feels like cement in there after eating.  PCP change PPI back to omeprazole 40 mg daily.  Continues to have the same symptoms.  Sometimes Pepto-Bismol helps but not as much as it did in the past.  Symptoms occur daily.  Worse with meals.  Worse if she picks up something heavy.  Frequent belching.  Denies aspirin or NSAIDs.  Bowel movements regular.  No blood in the stool or melena.  Weight has been stable.  Current Outpatient Medications  Medication Sig Dispense Refill  . acetaminophen (TYLENOL) 500 MG tablet Take 1,000 mg by mouth in the morning and at bedtime. pain    . Bismuth 262 MG CHEW Chew 2 tablets by mouth as needed. As needed    . cetirizine (ZYRTEC) 10 MG tablet Take 10 mg by mouth daily.    Marland Kitchen levothyroxine (SYNTHROID) 100 MCG tablet Take 100 mcg by mouth daily before breakfast. Name brand ONLY    . omeprazole (PRILOSEC) 40 MG capsule Take 1 capsule by mouth daily.    . promethazine (PHENERGAN) 12.5 MG tablet Take 12.5 mg by mouth every 8 (eight) hours as needed.     . traMADol (ULTRAM) 50 MG tablet Take 50 mg by mouth as needed.     . Carbinoxamine Maleate 4 MG TABS Take 1 tablet (4 mg total) by mouth in the morning and at bedtime. 60 tablet 5  . chlorpheniramine (CHLOR-TRIMETON) 4 MG tablet Take 1 tablet (4 mg total) by mouth 2 (two) times daily as needed for allergies. 60  tablet 5  . hydrocortisone 2.5 % cream  (Patient not taking: Reported on 09/20/2020)     No current facility-administered medications for this visit.    Allergies as of 09/20/2020 - Review Complete 09/20/2020  Allergen Reaction Noted  . Codeine Nausea And Vomiting   . Coumadin [warfarin]  06/19/2012  . Dexlansoprazole  04/05/2010  . Erythromycin ethylsuccinate  04/05/2010  . Esomeprazole magnesium Nausea And Vomiting   . Moxifloxacin Nausea And Vomiting 04/05/2010  . Omeprazole  04/05/2010  . Sulfonamide derivatives Nausea And Vomiting     ROS:  General: Negative for anorexia, weight loss, fever, chills, fatigue, weakness. ENT: Negative for hoarseness, difficulty swallowing , nasal congestion. CV: Negative for chest pain, angina, palpitations, dyspnea on exertion, peripheral edema.  Respiratory: Negative for dyspnea at rest, dyspnea on exertion, cough, sputum, wheezing.  GI: See history of present illness. GU:  Negative for dysuria, hematuria, urinary incontinence, urinary frequency, nocturnal urination.  Endo: Negative for unusual weight change.    Physical Examination:   BP (!) 160/80 (BP Location: Right Arm, Patient Position: Sitting, Cuff Size: Normal)   Pulse 73   Temp (!) 96.9 F (36.1 C) (Temporal)   Ht 5\' 2"  (1.575 m)   Wt 126 lb 12.8 oz (57.5 kg)   BMI 23.19 kg/m  General: Well-nourished, well-developed in no acute distress.  Eyes: No icterus. Mouth: masked Abdomen: Bowel sounds are normal, nontender, nondistended, no hepatosplenomegaly or masses, no abdominal bruits or hernia , no rebound or guarding.   Extremities: No lower extremity edema. No clubbing or deformities. Neuro: Alert and oriented x 4   Skin: Warm and dry, no jaundice.   Psych: Alert and cooperative, normal mood and affect.    Assessment/plan:  87 year old female with chronic GERD, historically has been difficult to manage, presenting for follow-up.  Complains of epigastric pain/burning,  heavy sensation in the upper abdomen after meals.  Has been doing well on pantoprazole 40 mg daily, recently switched to omeprazole 40 mg daily by PCP.  Discussed again today, should consider upper endoscopy given difficult to manage symptoms.  Patient again declines and wants to have her PPI switched.  Symptoms likely exacerbated by her large hiatal hernia.  1. Stop omeprazole.  Start Aciphex (rabeprazole) 20 mg twice daily before meals. 2. She will call in 2 to 3 weeks and let me know how she is doing.  If persistent symptoms, then she may be agreeable to upper endoscopy. 3. Reinforced antireflux measures. 4. Return to the office in 6 months.

## 2020-09-23 DIAGNOSIS — E538 Deficiency of other specified B group vitamins: Secondary | ICD-10-CM | POA: Diagnosis not present

## 2020-09-26 DIAGNOSIS — L080 Pyoderma: Secondary | ICD-10-CM | POA: Diagnosis not present

## 2020-09-26 DIAGNOSIS — D485 Neoplasm of uncertain behavior of skin: Secondary | ICD-10-CM | POA: Diagnosis not present

## 2020-10-01 DIAGNOSIS — J4541 Moderate persistent asthma with (acute) exacerbation: Secondary | ICD-10-CM | POA: Diagnosis not present

## 2020-10-01 DIAGNOSIS — J449 Chronic obstructive pulmonary disease, unspecified: Secondary | ICD-10-CM | POA: Diagnosis not present

## 2020-10-01 DIAGNOSIS — E7849 Other hyperlipidemia: Secondary | ICD-10-CM | POA: Diagnosis not present

## 2020-10-01 DIAGNOSIS — I10 Essential (primary) hypertension: Secondary | ICD-10-CM | POA: Diagnosis not present

## 2020-10-05 DIAGNOSIS — R300 Dysuria: Secondary | ICD-10-CM | POA: Diagnosis not present

## 2020-10-05 DIAGNOSIS — E039 Hypothyroidism, unspecified: Secondary | ICD-10-CM | POA: Diagnosis not present

## 2020-10-05 DIAGNOSIS — E538 Deficiency of other specified B group vitamins: Secondary | ICD-10-CM | POA: Diagnosis not present

## 2020-10-07 ENCOUNTER — Ambulatory Visit: Payer: Medicare HMO | Admitting: Urology

## 2020-10-07 DIAGNOSIS — Z6823 Body mass index (BMI) 23.0-23.9, adult: Secondary | ICD-10-CM | POA: Diagnosis not present

## 2020-10-07 DIAGNOSIS — R350 Frequency of micturition: Secondary | ICD-10-CM | POA: Diagnosis not present

## 2020-10-07 DIAGNOSIS — R300 Dysuria: Secondary | ICD-10-CM | POA: Diagnosis not present

## 2020-10-19 DIAGNOSIS — E538 Deficiency of other specified B group vitamins: Secondary | ICD-10-CM | POA: Diagnosis not present

## 2020-10-20 DIAGNOSIS — R300 Dysuria: Secondary | ICD-10-CM | POA: Diagnosis not present

## 2020-10-20 DIAGNOSIS — Z6822 Body mass index (BMI) 22.0-22.9, adult: Secondary | ICD-10-CM | POA: Diagnosis not present

## 2020-10-24 ENCOUNTER — Ambulatory Visit: Payer: Medicare HMO | Admitting: Gastroenterology

## 2020-10-28 ENCOUNTER — Other Ambulatory Visit: Payer: Self-pay

## 2020-10-28 MED ORDER — AZELASTINE HCL 0.1 % NA SOLN: NASAL | 0 refills | Status: DC

## 2020-10-28 MED ORDER — IPRATROPIUM BROMIDE 0.03 % NA SOLN: NASAL | 0 refills | Status: DC

## 2020-10-31 DIAGNOSIS — E7849 Other hyperlipidemia: Secondary | ICD-10-CM | POA: Diagnosis not present

## 2020-10-31 DIAGNOSIS — I10 Essential (primary) hypertension: Secondary | ICD-10-CM | POA: Diagnosis not present

## 2020-10-31 DIAGNOSIS — J449 Chronic obstructive pulmonary disease, unspecified: Secondary | ICD-10-CM | POA: Diagnosis not present

## 2020-10-31 DIAGNOSIS — J4541 Moderate persistent asthma with (acute) exacerbation: Secondary | ICD-10-CM | POA: Diagnosis not present

## 2020-11-03 DIAGNOSIS — E538 Deficiency of other specified B group vitamins: Secondary | ICD-10-CM | POA: Diagnosis not present

## 2020-11-07 DIAGNOSIS — Z885 Allergy status to narcotic agent status: Secondary | ICD-10-CM | POA: Diagnosis not present

## 2020-11-07 DIAGNOSIS — X58XXXA Exposure to other specified factors, initial encounter: Secondary | ICD-10-CM | POA: Diagnosis not present

## 2020-11-07 DIAGNOSIS — S0501XA Injury of conjunctiva and corneal abrasion without foreign body, right eye, initial encounter: Secondary | ICD-10-CM | POA: Diagnosis not present

## 2020-11-07 DIAGNOSIS — S0502XA Injury of conjunctiva and corneal abrasion without foreign body, left eye, initial encounter: Secondary | ICD-10-CM | POA: Diagnosis not present

## 2020-11-07 DIAGNOSIS — Z882 Allergy status to sulfonamides status: Secondary | ICD-10-CM | POA: Diagnosis not present

## 2020-11-07 DIAGNOSIS — Z973 Presence of spectacles and contact lenses: Secondary | ICD-10-CM | POA: Diagnosis not present

## 2020-11-07 DIAGNOSIS — J449 Chronic obstructive pulmonary disease, unspecified: Secondary | ICD-10-CM | POA: Diagnosis not present

## 2020-11-07 DIAGNOSIS — Z888 Allergy status to other drugs, medicaments and biological substances status: Secondary | ICD-10-CM | POA: Diagnosis not present

## 2020-11-09 DIAGNOSIS — Z6821 Body mass index (BMI) 21.0-21.9, adult: Secondary | ICD-10-CM | POA: Diagnosis not present

## 2020-11-09 DIAGNOSIS — S0500XA Injury of conjunctiva and corneal abrasion without foreign body, unspecified eye, initial encounter: Secondary | ICD-10-CM | POA: Diagnosis not present

## 2020-11-11 ENCOUNTER — Ambulatory Visit: Payer: Medicare HMO | Admitting: Allergy & Immunology

## 2020-11-16 ENCOUNTER — Other Ambulatory Visit: Payer: Self-pay | Admitting: Physician Assistant

## 2020-11-17 DIAGNOSIS — E538 Deficiency of other specified B group vitamins: Secondary | ICD-10-CM | POA: Diagnosis not present

## 2020-11-17 DIAGNOSIS — M79603 Pain in arm, unspecified: Secondary | ICD-10-CM | POA: Diagnosis not present

## 2020-11-22 DIAGNOSIS — E039 Hypothyroidism, unspecified: Secondary | ICD-10-CM | POA: Diagnosis not present

## 2020-11-22 DIAGNOSIS — I270 Primary pulmonary hypertension: Secondary | ICD-10-CM | POA: Diagnosis not present

## 2020-11-22 DIAGNOSIS — E539 Vitamin B deficiency, unspecified: Secondary | ICD-10-CM | POA: Diagnosis not present

## 2020-11-22 DIAGNOSIS — K580 Irritable bowel syndrome with diarrhea: Secondary | ICD-10-CM | POA: Diagnosis not present

## 2020-11-22 DIAGNOSIS — R4582 Worries: Secondary | ICD-10-CM | POA: Diagnosis not present

## 2020-11-22 DIAGNOSIS — E7849 Other hyperlipidemia: Secondary | ICD-10-CM | POA: Diagnosis not present

## 2020-11-22 DIAGNOSIS — I358 Other nonrheumatic aortic valve disorders: Secondary | ICD-10-CM | POA: Diagnosis not present

## 2020-11-22 DIAGNOSIS — M797 Fibromyalgia: Secondary | ICD-10-CM | POA: Diagnosis not present

## 2020-11-30 ENCOUNTER — Ambulatory Visit: Payer: Medicare HMO | Admitting: Urology

## 2020-12-01 DIAGNOSIS — E7849 Other hyperlipidemia: Secondary | ICD-10-CM | POA: Diagnosis not present

## 2020-12-01 DIAGNOSIS — J449 Chronic obstructive pulmonary disease, unspecified: Secondary | ICD-10-CM | POA: Diagnosis not present

## 2020-12-01 DIAGNOSIS — I10 Essential (primary) hypertension: Secondary | ICD-10-CM | POA: Diagnosis not present

## 2020-12-01 DIAGNOSIS — J4541 Moderate persistent asthma with (acute) exacerbation: Secondary | ICD-10-CM | POA: Diagnosis not present

## 2020-12-02 DIAGNOSIS — E538 Deficiency of other specified B group vitamins: Secondary | ICD-10-CM | POA: Diagnosis not present

## 2020-12-07 DIAGNOSIS — R197 Diarrhea, unspecified: Secondary | ICD-10-CM | POA: Diagnosis not present

## 2020-12-07 DIAGNOSIS — Z6822 Body mass index (BMI) 22.0-22.9, adult: Secondary | ICD-10-CM | POA: Diagnosis not present

## 2020-12-07 DIAGNOSIS — H02829 Cysts of unspecified eye, unspecified eyelid: Secondary | ICD-10-CM | POA: Diagnosis not present

## 2020-12-09 ENCOUNTER — Ambulatory Visit: Payer: Medicare HMO | Admitting: Allergy & Immunology

## 2020-12-15 DIAGNOSIS — E538 Deficiency of other specified B group vitamins: Secondary | ICD-10-CM | POA: Diagnosis not present

## 2020-12-29 DIAGNOSIS — D519 Vitamin B12 deficiency anemia, unspecified: Secondary | ICD-10-CM | POA: Diagnosis not present

## 2020-12-29 DIAGNOSIS — Z6822 Body mass index (BMI) 22.0-22.9, adult: Secondary | ICD-10-CM | POA: Diagnosis not present

## 2020-12-29 DIAGNOSIS — K219 Gastro-esophageal reflux disease without esophagitis: Secondary | ICD-10-CM | POA: Diagnosis not present

## 2021-01-05 DIAGNOSIS — R300 Dysuria: Secondary | ICD-10-CM | POA: Diagnosis not present

## 2021-01-05 DIAGNOSIS — R350 Frequency of micturition: Secondary | ICD-10-CM | POA: Diagnosis not present

## 2021-01-05 DIAGNOSIS — Z6822 Body mass index (BMI) 22.0-22.9, adult: Secondary | ICD-10-CM | POA: Diagnosis not present

## 2021-01-05 DIAGNOSIS — R32 Unspecified urinary incontinence: Secondary | ICD-10-CM | POA: Diagnosis not present

## 2021-01-06 ENCOUNTER — Ambulatory Visit: Payer: Medicare HMO | Admitting: Allergy & Immunology

## 2021-01-12 DIAGNOSIS — E538 Deficiency of other specified B group vitamins: Secondary | ICD-10-CM | POA: Diagnosis not present

## 2021-01-26 DIAGNOSIS — J45909 Unspecified asthma, uncomplicated: Secondary | ICD-10-CM | POA: Diagnosis not present

## 2021-01-26 DIAGNOSIS — E538 Deficiency of other specified B group vitamins: Secondary | ICD-10-CM | POA: Diagnosis not present

## 2021-01-26 DIAGNOSIS — K219 Gastro-esophageal reflux disease without esophagitis: Secondary | ICD-10-CM | POA: Diagnosis not present

## 2021-01-26 DIAGNOSIS — Z6821 Body mass index (BMI) 21.0-21.9, adult: Secondary | ICD-10-CM | POA: Diagnosis not present

## 2021-02-01 ENCOUNTER — Other Ambulatory Visit: Payer: Self-pay | Admitting: Allergy & Immunology

## 2021-02-01 DIAGNOSIS — I10 Essential (primary) hypertension: Secondary | ICD-10-CM | POA: Diagnosis not present

## 2021-02-01 DIAGNOSIS — J449 Chronic obstructive pulmonary disease, unspecified: Secondary | ICD-10-CM | POA: Diagnosis not present

## 2021-02-01 DIAGNOSIS — E7849 Other hyperlipidemia: Secondary | ICD-10-CM | POA: Diagnosis not present

## 2021-02-01 DIAGNOSIS — J4541 Moderate persistent asthma with (acute) exacerbation: Secondary | ICD-10-CM | POA: Diagnosis not present

## 2021-02-02 DIAGNOSIS — R300 Dysuria: Secondary | ICD-10-CM | POA: Diagnosis not present

## 2021-02-02 DIAGNOSIS — Z6822 Body mass index (BMI) 22.0-22.9, adult: Secondary | ICD-10-CM | POA: Diagnosis not present

## 2021-02-10 ENCOUNTER — Telehealth: Payer: Medicare HMO | Admitting: Urology

## 2021-02-14 DIAGNOSIS — Z23 Encounter for immunization: Secondary | ICD-10-CM | POA: Diagnosis not present

## 2021-02-14 DIAGNOSIS — Z6821 Body mass index (BMI) 21.0-21.9, adult: Secondary | ICD-10-CM | POA: Diagnosis not present

## 2021-02-14 DIAGNOSIS — M79644 Pain in right finger(s): Secondary | ICD-10-CM | POA: Diagnosis not present

## 2021-02-16 DIAGNOSIS — I10 Essential (primary) hypertension: Secondary | ICD-10-CM | POA: Diagnosis not present

## 2021-02-16 DIAGNOSIS — E039 Hypothyroidism, unspecified: Secondary | ICD-10-CM | POA: Diagnosis not present

## 2021-02-16 DIAGNOSIS — K2100 Gastro-esophageal reflux disease with esophagitis, without bleeding: Secondary | ICD-10-CM | POA: Diagnosis not present

## 2021-02-17 DIAGNOSIS — E538 Deficiency of other specified B group vitamins: Secondary | ICD-10-CM | POA: Diagnosis not present

## 2021-02-21 DIAGNOSIS — Z23 Encounter for immunization: Secondary | ICD-10-CM | POA: Diagnosis not present

## 2021-02-21 DIAGNOSIS — R4582 Worries: Secondary | ICD-10-CM | POA: Diagnosis not present

## 2021-02-21 DIAGNOSIS — K580 Irritable bowel syndrome with diarrhea: Secondary | ICD-10-CM | POA: Diagnosis not present

## 2021-02-21 DIAGNOSIS — I270 Primary pulmonary hypertension: Secondary | ICD-10-CM | POA: Diagnosis not present

## 2021-02-21 DIAGNOSIS — I358 Other nonrheumatic aortic valve disorders: Secondary | ICD-10-CM | POA: Diagnosis not present

## 2021-02-21 DIAGNOSIS — E7849 Other hyperlipidemia: Secondary | ICD-10-CM | POA: Diagnosis not present

## 2021-02-21 DIAGNOSIS — I10 Essential (primary) hypertension: Secondary | ICD-10-CM | POA: Diagnosis not present

## 2021-02-21 DIAGNOSIS — E539 Vitamin B deficiency, unspecified: Secondary | ICD-10-CM | POA: Diagnosis not present

## 2021-02-28 DIAGNOSIS — J449 Chronic obstructive pulmonary disease, unspecified: Secondary | ICD-10-CM | POA: Diagnosis not present

## 2021-02-28 DIAGNOSIS — R059 Cough, unspecified: Secondary | ICD-10-CM | POA: Diagnosis not present

## 2021-02-28 DIAGNOSIS — Z20828 Contact with and (suspected) exposure to other viral communicable diseases: Secondary | ICD-10-CM | POA: Diagnosis not present

## 2021-03-07 ENCOUNTER — Telehealth: Payer: Medicare HMO | Admitting: Urology

## 2021-03-10 ENCOUNTER — Ambulatory Visit: Payer: Medicare HMO | Admitting: Allergy & Immunology

## 2021-03-10 DIAGNOSIS — E538 Deficiency of other specified B group vitamins: Secondary | ICD-10-CM | POA: Diagnosis not present

## 2021-03-13 DIAGNOSIS — J449 Chronic obstructive pulmonary disease, unspecified: Secondary | ICD-10-CM | POA: Diagnosis not present

## 2021-03-13 DIAGNOSIS — Z6821 Body mass index (BMI) 21.0-21.9, adult: Secondary | ICD-10-CM | POA: Diagnosis not present

## 2021-03-13 DIAGNOSIS — R059 Cough, unspecified: Secondary | ICD-10-CM | POA: Diagnosis not present

## 2021-03-24 ENCOUNTER — Ambulatory Visit: Payer: Medicare HMO | Admitting: Allergy & Immunology

## 2021-03-24 DIAGNOSIS — E538 Deficiency of other specified B group vitamins: Secondary | ICD-10-CM | POA: Diagnosis not present

## 2021-03-27 ENCOUNTER — Ambulatory Visit: Payer: Medicare HMO | Admitting: Gastroenterology

## 2021-03-27 DIAGNOSIS — I10 Essential (primary) hypertension: Secondary | ICD-10-CM | POA: Diagnosis not present

## 2021-03-27 DIAGNOSIS — E039 Hypothyroidism, unspecified: Secondary | ICD-10-CM | POA: Diagnosis not present

## 2021-03-31 ENCOUNTER — Ambulatory Visit: Payer: Medicare HMO | Admitting: Allergy & Immunology

## 2021-04-03 DIAGNOSIS — I358 Other nonrheumatic aortic valve disorders: Secondary | ICD-10-CM | POA: Diagnosis not present

## 2021-04-03 DIAGNOSIS — K580 Irritable bowel syndrome with diarrhea: Secondary | ICD-10-CM | POA: Diagnosis not present

## 2021-04-03 DIAGNOSIS — E539 Vitamin B deficiency, unspecified: Secondary | ICD-10-CM | POA: Diagnosis not present

## 2021-04-03 DIAGNOSIS — R4582 Worries: Secondary | ICD-10-CM | POA: Diagnosis not present

## 2021-04-03 DIAGNOSIS — I270 Primary pulmonary hypertension: Secondary | ICD-10-CM | POA: Diagnosis not present

## 2021-04-03 DIAGNOSIS — I10 Essential (primary) hypertension: Secondary | ICD-10-CM | POA: Diagnosis not present

## 2021-04-03 DIAGNOSIS — E7849 Other hyperlipidemia: Secondary | ICD-10-CM | POA: Diagnosis not present

## 2021-04-03 DIAGNOSIS — J329 Chronic sinusitis, unspecified: Secondary | ICD-10-CM | POA: Diagnosis not present

## 2021-04-05 ENCOUNTER — Other Ambulatory Visit: Payer: Self-pay | Admitting: Allergy & Immunology

## 2021-04-10 DIAGNOSIS — E538 Deficiency of other specified B group vitamins: Secondary | ICD-10-CM | POA: Diagnosis not present

## 2021-04-12 ENCOUNTER — Other Ambulatory Visit: Payer: Self-pay | Admitting: Allergy & Immunology

## 2021-04-26 DIAGNOSIS — R059 Cough, unspecified: Secondary | ICD-10-CM | POA: Diagnosis not present

## 2021-04-26 DIAGNOSIS — R058 Other specified cough: Secondary | ICD-10-CM | POA: Diagnosis not present

## 2021-04-28 DIAGNOSIS — E538 Deficiency of other specified B group vitamins: Secondary | ICD-10-CM | POA: Diagnosis not present

## 2021-05-01 ENCOUNTER — Telehealth: Payer: Medicare HMO | Admitting: Urology

## 2021-05-02 DIAGNOSIS — M79604 Pain in right leg: Secondary | ICD-10-CM | POA: Diagnosis not present

## 2021-05-02 DIAGNOSIS — Z6821 Body mass index (BMI) 21.0-21.9, adult: Secondary | ICD-10-CM | POA: Diagnosis not present

## 2021-05-02 DIAGNOSIS — S8011XA Contusion of right lower leg, initial encounter: Secondary | ICD-10-CM | POA: Diagnosis not present

## 2021-05-03 DIAGNOSIS — M7989 Other specified soft tissue disorders: Secondary | ICD-10-CM | POA: Diagnosis not present

## 2021-05-03 DIAGNOSIS — S8011XA Contusion of right lower leg, initial encounter: Secondary | ICD-10-CM | POA: Diagnosis not present

## 2021-05-08 DIAGNOSIS — Z6821 Body mass index (BMI) 21.0-21.9, adult: Secondary | ICD-10-CM | POA: Diagnosis not present

## 2021-05-08 DIAGNOSIS — K529 Noninfective gastroenteritis and colitis, unspecified: Secondary | ICD-10-CM | POA: Diagnosis not present

## 2021-05-08 DIAGNOSIS — L03115 Cellulitis of right lower limb: Secondary | ICD-10-CM | POA: Diagnosis not present

## 2021-05-08 DIAGNOSIS — R079 Chest pain, unspecified: Secondary | ICD-10-CM | POA: Diagnosis not present

## 2021-05-12 DIAGNOSIS — E538 Deficiency of other specified B group vitamins: Secondary | ICD-10-CM | POA: Diagnosis not present

## 2021-05-22 DIAGNOSIS — L03115 Cellulitis of right lower limb: Secondary | ICD-10-CM | POA: Diagnosis not present

## 2021-05-22 DIAGNOSIS — M79604 Pain in right leg: Secondary | ICD-10-CM | POA: Diagnosis not present

## 2021-05-22 DIAGNOSIS — Z6821 Body mass index (BMI) 21.0-21.9, adult: Secondary | ICD-10-CM | POA: Diagnosis not present

## 2021-05-22 DIAGNOSIS — I10 Essential (primary) hypertension: Secondary | ICD-10-CM | POA: Diagnosis not present

## 2021-05-29 DIAGNOSIS — E538 Deficiency of other specified B group vitamins: Secondary | ICD-10-CM | POA: Diagnosis not present

## 2021-06-01 DIAGNOSIS — I10 Essential (primary) hypertension: Secondary | ICD-10-CM | POA: Diagnosis not present

## 2021-06-01 DIAGNOSIS — S80919A Unspecified superficial injury of unspecified knee, initial encounter: Secondary | ICD-10-CM | POA: Diagnosis not present

## 2021-06-01 DIAGNOSIS — S8002XA Contusion of left knee, initial encounter: Secondary | ICD-10-CM | POA: Diagnosis not present

## 2021-06-01 DIAGNOSIS — Z880 Allergy status to penicillin: Secondary | ICD-10-CM | POA: Diagnosis not present

## 2021-06-01 DIAGNOSIS — R52 Pain, unspecified: Secondary | ICD-10-CM | POA: Diagnosis not present

## 2021-06-01 DIAGNOSIS — Z881 Allergy status to other antibiotic agents status: Secondary | ICD-10-CM | POA: Diagnosis not present

## 2021-06-01 DIAGNOSIS — Z882 Allergy status to sulfonamides status: Secondary | ICD-10-CM | POA: Diagnosis not present

## 2021-06-01 DIAGNOSIS — M25562 Pain in left knee: Secondary | ICD-10-CM | POA: Diagnosis not present

## 2021-06-01 DIAGNOSIS — Z885 Allergy status to narcotic agent status: Secondary | ICD-10-CM | POA: Diagnosis not present

## 2021-06-01 DIAGNOSIS — Z888 Allergy status to other drugs, medicaments and biological substances status: Secondary | ICD-10-CM | POA: Diagnosis not present

## 2021-06-07 DIAGNOSIS — S81801A Unspecified open wound, right lower leg, initial encounter: Secondary | ICD-10-CM | POA: Diagnosis not present

## 2021-06-07 DIAGNOSIS — E039 Hypothyroidism, unspecified: Secondary | ICD-10-CM | POA: Diagnosis not present

## 2021-06-07 DIAGNOSIS — S81801D Unspecified open wound, right lower leg, subsequent encounter: Secondary | ICD-10-CM | POA: Diagnosis not present

## 2021-06-07 DIAGNOSIS — K219 Gastro-esophageal reflux disease without esophagitis: Secondary | ICD-10-CM | POA: Diagnosis not present

## 2021-06-07 DIAGNOSIS — J449 Chronic obstructive pulmonary disease, unspecified: Secondary | ICD-10-CM | POA: Diagnosis not present

## 2021-06-07 DIAGNOSIS — Z9049 Acquired absence of other specified parts of digestive tract: Secondary | ICD-10-CM | POA: Diagnosis not present

## 2021-06-07 DIAGNOSIS — M810 Age-related osteoporosis without current pathological fracture: Secondary | ICD-10-CM | POA: Diagnosis not present

## 2021-06-07 DIAGNOSIS — Z79890 Hormone replacement therapy (postmenopausal): Secondary | ICD-10-CM | POA: Diagnosis not present

## 2021-06-07 DIAGNOSIS — L97919 Non-pressure chronic ulcer of unspecified part of right lower leg with unspecified severity: Secondary | ICD-10-CM | POA: Diagnosis not present

## 2021-06-07 DIAGNOSIS — K589 Irritable bowel syndrome without diarrhea: Secondary | ICD-10-CM | POA: Diagnosis not present

## 2021-06-09 ENCOUNTER — Ambulatory Visit: Payer: Medicare HMO | Admitting: Urology

## 2021-06-12 DIAGNOSIS — E538 Deficiency of other specified B group vitamins: Secondary | ICD-10-CM | POA: Diagnosis not present

## 2021-06-14 DIAGNOSIS — R948 Abnormal results of function studies of other organs and systems: Secondary | ICD-10-CM | POA: Diagnosis not present

## 2021-06-14 DIAGNOSIS — L97919 Non-pressure chronic ulcer of unspecified part of right lower leg with unspecified severity: Secondary | ICD-10-CM | POA: Diagnosis not present

## 2021-06-21 DIAGNOSIS — M810 Age-related osteoporosis without current pathological fracture: Secondary | ICD-10-CM | POA: Diagnosis not present

## 2021-06-21 DIAGNOSIS — J449 Chronic obstructive pulmonary disease, unspecified: Secondary | ICD-10-CM | POA: Diagnosis not present

## 2021-06-21 DIAGNOSIS — Z79890 Hormone replacement therapy (postmenopausal): Secondary | ICD-10-CM | POA: Diagnosis not present

## 2021-06-21 DIAGNOSIS — S81801A Unspecified open wound, right lower leg, initial encounter: Secondary | ICD-10-CM | POA: Diagnosis not present

## 2021-06-21 DIAGNOSIS — K219 Gastro-esophageal reflux disease without esophagitis: Secondary | ICD-10-CM | POA: Diagnosis not present

## 2021-06-21 DIAGNOSIS — E039 Hypothyroidism, unspecified: Secondary | ICD-10-CM | POA: Diagnosis not present

## 2021-06-21 DIAGNOSIS — L97919 Non-pressure chronic ulcer of unspecified part of right lower leg with unspecified severity: Secondary | ICD-10-CM | POA: Diagnosis not present

## 2021-06-21 DIAGNOSIS — Z9049 Acquired absence of other specified parts of digestive tract: Secondary | ICD-10-CM | POA: Diagnosis not present

## 2021-06-21 DIAGNOSIS — K589 Irritable bowel syndrome without diarrhea: Secondary | ICD-10-CM | POA: Diagnosis not present

## 2021-06-21 DIAGNOSIS — S81801D Unspecified open wound, right lower leg, subsequent encounter: Secondary | ICD-10-CM | POA: Diagnosis not present

## 2021-06-22 DIAGNOSIS — Z6821 Body mass index (BMI) 21.0-21.9, adult: Secondary | ICD-10-CM | POA: Diagnosis not present

## 2021-06-22 DIAGNOSIS — R059 Cough, unspecified: Secondary | ICD-10-CM | POA: Diagnosis not present

## 2021-06-22 DIAGNOSIS — R0981 Nasal congestion: Secondary | ICD-10-CM | POA: Diagnosis not present

## 2021-06-26 DIAGNOSIS — E538 Deficiency of other specified B group vitamins: Secondary | ICD-10-CM | POA: Diagnosis not present

## 2021-07-02 DIAGNOSIS — E7849 Other hyperlipidemia: Secondary | ICD-10-CM | POA: Diagnosis not present

## 2021-07-02 DIAGNOSIS — I10 Essential (primary) hypertension: Secondary | ICD-10-CM | POA: Diagnosis not present

## 2021-07-12 ENCOUNTER — Encounter: Payer: Medicare HMO | Admitting: Vascular Surgery

## 2021-07-12 ENCOUNTER — Ambulatory Visit: Payer: Medicare HMO | Admitting: Urology

## 2021-07-14 DIAGNOSIS — E538 Deficiency of other specified B group vitamins: Secondary | ICD-10-CM | POA: Diagnosis not present

## 2021-07-19 ENCOUNTER — Encounter: Payer: Medicare HMO | Admitting: Vascular Surgery

## 2021-07-19 ENCOUNTER — Other Ambulatory Visit: Payer: Self-pay

## 2021-07-19 ENCOUNTER — Ambulatory Visit: Payer: Medicare HMO | Admitting: Vascular Surgery

## 2021-07-19 ENCOUNTER — Encounter: Payer: Self-pay | Admitting: Vascular Surgery

## 2021-07-19 VITALS — BP 186/84 | HR 80 | Temp 96.8°F | Resp 20 | Ht 65.0 in | Wt 117.2 lb

## 2021-07-19 DIAGNOSIS — S81801A Unspecified open wound, right lower leg, initial encounter: Secondary | ICD-10-CM | POA: Diagnosis not present

## 2021-07-19 DIAGNOSIS — S81001A Unspecified open wound, right knee, initial encounter: Secondary | ICD-10-CM | POA: Diagnosis not present

## 2021-07-19 DIAGNOSIS — S91001A Unspecified open wound, right ankle, initial encounter: Secondary | ICD-10-CM | POA: Diagnosis not present
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
## 2021-07-28 DIAGNOSIS — E538 Deficiency of other specified B group vitamins: Secondary | ICD-10-CM | POA: Diagnosis not present

## 2021-07-31 ENCOUNTER — Ambulatory Visit: Payer: Medicare HMO | Admitting: Urology

## 2021-08-02 DIAGNOSIS — N390 Urinary tract infection, site not specified: Secondary | ICD-10-CM | POA: Diagnosis not present

## 2021-08-03 DIAGNOSIS — R5381 Other malaise: Secondary | ICD-10-CM | POA: Diagnosis not present

## 2021-08-03 DIAGNOSIS — E039 Hypothyroidism, unspecified: Secondary | ICD-10-CM | POA: Diagnosis not present

## 2021-08-03 DIAGNOSIS — E7849 Other hyperlipidemia: Secondary | ICD-10-CM | POA: Diagnosis not present

## 2021-08-03 DIAGNOSIS — E782 Mixed hyperlipidemia: Secondary | ICD-10-CM | POA: Diagnosis not present

## 2021-08-03 DIAGNOSIS — I10 Essential (primary) hypertension: Secondary | ICD-10-CM | POA: Diagnosis not present

## 2021-08-07 DIAGNOSIS — I10 Essential (primary) hypertension: Secondary | ICD-10-CM | POA: Diagnosis not present

## 2021-08-07 DIAGNOSIS — I358 Other nonrheumatic aortic valve disorders: Secondary | ICD-10-CM | POA: Diagnosis not present

## 2021-08-07 DIAGNOSIS — Z23 Encounter for immunization: Secondary | ICD-10-CM | POA: Diagnosis not present

## 2021-08-07 DIAGNOSIS — I270 Primary pulmonary hypertension: Secondary | ICD-10-CM | POA: Diagnosis not present

## 2021-08-07 DIAGNOSIS — E539 Vitamin B deficiency, unspecified: Secondary | ICD-10-CM | POA: Diagnosis not present

## 2021-08-07 DIAGNOSIS — K580 Irritable bowel syndrome with diarrhea: Secondary | ICD-10-CM | POA: Diagnosis not present

## 2021-08-07 DIAGNOSIS — Z0001 Encounter for general adult medical examination with abnormal findings: Secondary | ICD-10-CM | POA: Diagnosis not present

## 2021-08-07 DIAGNOSIS — M797 Fibromyalgia: Secondary | ICD-10-CM | POA: Diagnosis not present

## 2021-08-11 DIAGNOSIS — E538 Deficiency of other specified B group vitamins: Secondary | ICD-10-CM | POA: Diagnosis not present

## 2021-08-14 ENCOUNTER — Ambulatory Visit: Payer: Medicare HMO | Admitting: Gastroenterology

## 2021-08-22 ENCOUNTER — Ambulatory Visit: Payer: Medicare HMO | Admitting: Urology

## 2021-08-22 DIAGNOSIS — Z20828 Contact with and (suspected) exposure to other viral communicable diseases: Secondary | ICD-10-CM | POA: Diagnosis not present

## 2021-08-22 DIAGNOSIS — J069 Acute upper respiratory infection, unspecified: Secondary | ICD-10-CM | POA: Diagnosis not present

## 2021-08-22 DIAGNOSIS — Z6821 Body mass index (BMI) 21.0-21.9, adult: Secondary | ICD-10-CM | POA: Diagnosis not present

## 2021-08-25 DIAGNOSIS — E7849 Other hyperlipidemia: Secondary | ICD-10-CM | POA: Diagnosis not present

## 2021-08-25 DIAGNOSIS — J449 Chronic obstructive pulmonary disease, unspecified: Secondary | ICD-10-CM | POA: Diagnosis not present

## 2021-08-25 DIAGNOSIS — I358 Other nonrheumatic aortic valve disorders: Secondary | ICD-10-CM | POA: Diagnosis not present

## 2021-08-25 DIAGNOSIS — I10 Essential (primary) hypertension: Secondary | ICD-10-CM | POA: Diagnosis not present

## 2021-08-25 DIAGNOSIS — B538 Other malaria, not elsewhere classified: Secondary | ICD-10-CM | POA: Diagnosis not present

## 2021-08-25 DIAGNOSIS — D649 Anemia, unspecified: Secondary | ICD-10-CM | POA: Diagnosis not present

## 2021-08-25 DIAGNOSIS — Z0001 Encounter for general adult medical examination with abnormal findings: Secondary | ICD-10-CM | POA: Diagnosis not present

## 2021-08-25 DIAGNOSIS — M797 Fibromyalgia: Secondary | ICD-10-CM | POA: Diagnosis not present

## 2021-09-06 ENCOUNTER — Ambulatory Visit: Payer: Medicare HMO | Admitting: Urology

## 2021-09-07 DIAGNOSIS — D519 Vitamin B12 deficiency anemia, unspecified: Secondary | ICD-10-CM | POA: Diagnosis not present

## 2021-09-07 DIAGNOSIS — I10 Essential (primary) hypertension: Secondary | ICD-10-CM | POA: Diagnosis not present

## 2021-09-07 DIAGNOSIS — Z6821 Body mass index (BMI) 21.0-21.9, adult: Secondary | ICD-10-CM | POA: Diagnosis not present

## 2021-09-07 DIAGNOSIS — J069 Acute upper respiratory infection, unspecified: Secondary | ICD-10-CM | POA: Diagnosis not present

## 2021-09-07 DIAGNOSIS — R300 Dysuria: Secondary | ICD-10-CM | POA: Diagnosis not present

## 2021-09-11 DIAGNOSIS — N3090 Cystitis, unspecified without hematuria: Secondary | ICD-10-CM | POA: Diagnosis not present

## 2021-09-11 DIAGNOSIS — R300 Dysuria: Secondary | ICD-10-CM | POA: Diagnosis not present

## 2021-09-21 DIAGNOSIS — E538 Deficiency of other specified B group vitamins: Secondary | ICD-10-CM | POA: Diagnosis not present

## 2021-09-27 DIAGNOSIS — R350 Frequency of micturition: Secondary | ICD-10-CM | POA: Diagnosis not present

## 2021-09-27 DIAGNOSIS — Z6821 Body mass index (BMI) 21.0-21.9, adult: Secondary | ICD-10-CM | POA: Diagnosis not present

## 2021-10-01 DIAGNOSIS — E7849 Other hyperlipidemia: Secondary | ICD-10-CM | POA: Diagnosis not present

## 2021-10-01 DIAGNOSIS — I10 Essential (primary) hypertension: Secondary | ICD-10-CM | POA: Diagnosis not present

## 2021-10-01 DIAGNOSIS — J4541 Moderate persistent asthma with (acute) exacerbation: Secondary | ICD-10-CM | POA: Diagnosis not present

## 2021-10-01 DIAGNOSIS — J449 Chronic obstructive pulmonary disease, unspecified: Secondary | ICD-10-CM | POA: Diagnosis not present

## 2021-10-06 DIAGNOSIS — E538 Deficiency of other specified B group vitamins: Secondary | ICD-10-CM | POA: Diagnosis not present

## 2021-10-09 ENCOUNTER — Ambulatory Visit: Payer: Medicare HMO | Admitting: Urology

## 2021-10-20 DIAGNOSIS — E538 Deficiency of other specified B group vitamins: Secondary | ICD-10-CM | POA: Diagnosis not present

## 2021-11-03 DIAGNOSIS — E538 Deficiency of other specified B group vitamins: Secondary | ICD-10-CM | POA: Diagnosis not present

## 2021-11-08 DIAGNOSIS — E7849 Other hyperlipidemia: Secondary | ICD-10-CM | POA: Diagnosis not present

## 2021-11-08 DIAGNOSIS — E782 Mixed hyperlipidemia: Secondary | ICD-10-CM | POA: Diagnosis not present

## 2021-11-08 DIAGNOSIS — K2100 Gastro-esophageal reflux disease with esophagitis, without bleeding: Secondary | ICD-10-CM | POA: Diagnosis not present

## 2021-11-08 DIAGNOSIS — I10 Essential (primary) hypertension: Secondary | ICD-10-CM | POA: Diagnosis not present

## 2021-11-08 DIAGNOSIS — E039 Hypothyroidism, unspecified: Secondary | ICD-10-CM | POA: Diagnosis not present

## 2021-11-08 DIAGNOSIS — M797 Fibromyalgia: Secondary | ICD-10-CM | POA: Diagnosis not present

## 2021-11-08 DIAGNOSIS — J449 Chronic obstructive pulmonary disease, unspecified: Secondary | ICD-10-CM | POA: Diagnosis not present

## 2021-11-13 DIAGNOSIS — E039 Hypothyroidism, unspecified: Secondary | ICD-10-CM | POA: Diagnosis not present

## 2021-11-13 DIAGNOSIS — E7849 Other hyperlipidemia: Secondary | ICD-10-CM | POA: Diagnosis not present

## 2021-11-13 DIAGNOSIS — K580 Irritable bowel syndrome with diarrhea: Secondary | ICD-10-CM | POA: Diagnosis not present

## 2021-11-13 DIAGNOSIS — I270 Primary pulmonary hypertension: Secondary | ICD-10-CM | POA: Diagnosis not present

## 2021-11-13 DIAGNOSIS — I10 Essential (primary) hypertension: Secondary | ICD-10-CM | POA: Diagnosis not present

## 2021-11-13 DIAGNOSIS — M797 Fibromyalgia: Secondary | ICD-10-CM | POA: Diagnosis not present

## 2021-11-13 DIAGNOSIS — M545 Low back pain, unspecified: Secondary | ICD-10-CM | POA: Diagnosis not present

## 2021-11-13 DIAGNOSIS — D692 Other nonthrombocytopenic purpura: Secondary | ICD-10-CM | POA: Diagnosis not present

## 2021-11-13 DIAGNOSIS — E539 Vitamin B deficiency, unspecified: Secondary | ICD-10-CM | POA: Diagnosis not present

## 2021-11-17 DIAGNOSIS — E538 Deficiency of other specified B group vitamins: Secondary | ICD-10-CM | POA: Diagnosis not present

## 2021-11-27 DIAGNOSIS — R350 Frequency of micturition: Secondary | ICD-10-CM | POA: Diagnosis not present

## 2021-11-27 DIAGNOSIS — R300 Dysuria: Secondary | ICD-10-CM | POA: Diagnosis not present

## 2021-11-27 DIAGNOSIS — R030 Elevated blood-pressure reading, without diagnosis of hypertension: Secondary | ICD-10-CM | POA: Diagnosis not present

## 2021-11-27 DIAGNOSIS — N3000 Acute cystitis without hematuria: Secondary | ICD-10-CM | POA: Diagnosis not present

## 2021-11-27 DIAGNOSIS — R32 Unspecified urinary incontinence: Secondary | ICD-10-CM | POA: Diagnosis not present

## 2021-12-01 DIAGNOSIS — E538 Deficiency of other specified B group vitamins: Secondary | ICD-10-CM | POA: Diagnosis not present

## 2021-12-13 ENCOUNTER — Encounter: Payer: Self-pay | Admitting: Urology

## 2021-12-13 ENCOUNTER — Ambulatory Visit: Payer: Medicare HMO | Admitting: Urology

## 2021-12-13 VITALS — BP 147/72 | HR 77

## 2021-12-13 DIAGNOSIS — R350 Frequency of micturition: Secondary | ICD-10-CM | POA: Diagnosis not present

## 2021-12-13 LAB — BLADDER SCAN AMB NON-IMAGING: Scan Result: 61

## 2021-12-13 MED ORDER — GEMTESA 75 MG PO TABS: 1.0000 | ORAL_TABLET | Freq: Every day | ORAL | 0 refills | Status: DC

## 2021-12-13 NOTE — Progress Notes (Signed)
12/13/2021 2:12 PM   Angela Nevin Janetta Hora 08/13/1930 [id number]  Referring provider: Janine Limbo, PA-C [place],  [place] [address]  Urinary frequency   HPI: Ms Sabrina Morrison is a [phone] here for followup for urinary frequency and recurrent UTI. No UTI since last visit. She is currently on oxybutynin '5mg'$  daily and mirabegron '25mg'$  daily. She was previously on [place] '4mg'$  daily. She has urinary frequency every 1 hour. She has nocturia 0-3x. She denies straining to urinate. She has a strong urinary stream.    PMH: Past Medical History:  Diagnosis Date   Allergic rhinitis    Allergy    Asthma    Cancer ([place])    skin cancer form left arm   Cataract    Fibromyalgia    Gastroparesis 08/2008   mild, 35% retention at 2 hours (normal is less than 30% at 2 hours)   GERD (gastroesophageal reflux disease)    Hiatal hernia 09/01/2004   large   Hyperplastic colon polyp    Hypothyroidism    IDA (iron deficiency anemia)    Irritable bowel syndrome    Muscle pain, fibromyalgia    PONV (postoperative nausea and vomiting)    Schatzki's ring    h/o 8 EGDs   Shortness of breath    Tubular adenoma     Surgical History: Past Surgical History:  Procedure Laterality Date   ABDOMINAL HYSTERECTOMY     APPENDECTOMY     ARM SKIN LESION BIOPSY / EXCISION     left arm   CATARACT EXTRACTION W/PHACO  08/27/2011   Procedure: CATARACT EXTRACTION PHACO AND INTRAOCULAR LENS PLACEMENT ([place]);  Surgeon: Tonny Branch, MD;  Location: [facility];  Service: Ophthalmology;  Laterality: Right;  CDE: 15.12   COLON SURGERY     COLONOSCOPY  09/01/2004   RMR: normal, due 08/2014   COLONOSCOPY WITH ESOPHAGOGASTRODUODENOSCOPY (EGD)  06/05/2012   Dr. Gala Romney- EGD- "corkscrewed" tubular esophagus.  hiatal hernia beign bx. TCS- colonic divertiulosis, tubular adenoma, hyperplastic polyp   DILATION AND CURETTAGE OF UTERUS     x2   ESOPHAGOGASTRODUODENOSCOPY  8/08   RMR: cork-screwed esophagus,prominent Schatki's ring large hh    ESOPHAGOGASTRODUODENOSCOPY  04/18/10   prominent schatzki's ring/large hiatal hernia  otherwise normal stomach   ESOPHAGOGASTRODUODENOSCOPY   09/25/2002   RMR: Prominent Schatski's ring, foreshortened esophagus, moderate size hiatal hernia.  The remainder of the stomach and duodenum through the second portion appeared normal.  Status post balloon dilation of ring,   ESOPHAGOGASTRODUODENOSCOPY  01/28/2004   BZJ:[id number]'[id number] ring status post dilation as described above. Otherwise normal/ Large hiatal hernia   ESOPHAGOGASTRODUODENOSCOPY  09/01/2004   RMR: Schatzki's ring, otherwise normal esophagus status post dilation/Moderately large hiatal hernia   ESOPHAGOGASTRODUODENOSCOPY   12/14/2005   RMR: Schatzki's ring, otherwise normal esophagus status post dilation /Large hiatal hernia   EYE SURGERY     GIVENS CAPSULE STUDY  06/26/2012   Dr. Gala Romney- probable 2 superficial erosions in the mid small bowel, no evidence of tumor or blood anywhere in the small intestine.   S/P Hysterectomy     segmental colectomy     with incidental appendectomy in [place] Bilateral 0/17/5102   Procedure: YAG LASER APPLICATION;  Surgeon: Williams Che, MD;  Location: [facility];  Service: Ophthalmology;  Laterality: Bilateral;    Home Medications:  Allergies as of 12/13/2021       Reactions   Codeine Nausea And Vomiting   Coumadin [warfarin]  Dexlansoprazole    REACTION: chest tightness, muscle aches, but tolerates prevacid   Erythromycin Ethylsuccinate    REACTION: left sided weakness   Esomeprazole Magnesium Nausea And Vomiting   Moxifloxacin Nausea And Vomiting   Omeprazole    REACTION: muscle ache/chest tightness   Sulfonamide Derivatives Nausea And Vomiting        Medication List        Accurate as of December 13, 2021  2:12 PM. If you have any questions, ask your nurse or doctor.          acetaminophen 500 MG tablet Commonly known as: TYLENOL Take 1,000 mg by mouth in the  morning and at bedtime. pain   Azelastine HCl 137 MCG/SPRAY Soln USE TWO SPRAYS IN EACH NOSTRIL TWICE DAILY AS DIRECTED.   Bismuth 262 MG Chew Chew 2 tablets by mouth as needed. As needed   Carbinoxamine Maleate 4 MG Tabs Take 1 tablet (4 mg total) by mouth in the morning and at bedtime.   cetirizine 10 MG tablet Commonly known as: ZYRTEC Take 10 mg by mouth daily.   chlorpheniramine 4 MG tablet Commonly known as: CHLOR-TRIMETON Take 1 tablet (4 mg total) by mouth 2 (two) times daily as needed for allergies.   hydrocortisone 2.5 % cream   ipratropium 0.03 % nasal spray Commonly known as: ATROVENT USE 1 SPRAY IN EACH NOSTRIL THREE TIMES DAILY AS DIRECTED (MUST KEEP APPOINTMENT FOR FURTHER REFILLS)   levothyroxine 100 MCG tablet Commonly known as: SYNTHROID Take 100 mcg by mouth daily before breakfast. Name brand ONLY   promethazine 12.5 MG tablet Commonly known as: PHENERGAN Take 12.5 mg by mouth every 8 (eight) hours as needed.   RABEprazole 20 MG tablet Commonly known as: ACIPHEX Take 1 tablet (20 mg total) by mouth 2 (two) times daily before a meal.   traMADol 50 MG tablet Commonly known as: ULTRAM Take 50 mg by mouth as needed.        Allergies:  Allergies  Allergen Reactions   Codeine Nausea And Vomiting   Coumadin [Warfarin]    Dexlansoprazole     REACTION: chest tightness, muscle aches, but tolerates prevacid   Erythromycin Ethylsuccinate     REACTION: left sided weakness   Esomeprazole Magnesium Nausea And Vomiting   Moxifloxacin Nausea And Vomiting   Omeprazole     REACTION: muscle ache/chest tightness   Sulfonamide Derivatives Nausea And Vomiting    Family History: Family History  Problem Relation Age of Onset   Heart attack Father 85       deceased   Colon cancer Neg Hx    Anesthesia problems Neg Hx    Hypotension Neg Hx    Malignant hyperthermia Neg Hx    Pseudochol deficiency Neg Hx     Social History:  reports that she has never  smoked. She has never used smokeless tobacco. She reports that she does not drink alcohol and does not use drugs.  ROS: All other review of systems were reviewed and are negative except what is noted above in HPI  Physical Exam: BP (!) 147/72   Pulse 77   Constitutional:  Alert and oriented, No acute distress. HEENT: [place] AT, moist mucus membranes.  Trachea midline, no masses. Cardiovascular: No clubbing, cyanosis, or edema. Respiratory: Normal respiratory effort, no increased work of breathing. GI: Abdomen is soft, nontender, nondistended, no abdominal masses GU: No CVA tenderness.  Lymph: No cervical or inguinal lymphadenopathy. Skin: No rashes, bruises or suspicious lesions. Neurologic: Grossly intact,  no focal deficits, moving all 4 extremities. Psychiatric: Normal mood and affect.  Laboratory Data: Lab Results  Component Value Date   WBC 6.0 08/24/2014   HGB 11.2 (L) 08/24/2014   HCT 34.3 (L) 08/24/2014   MCV 95.8 08/24/2014   PLT 313 08/24/2014    Lab Results  Component Value Date   CREATININE 0.89 08/21/2011    No results found for: "PSA"  No results found for: "TESTOSTERONE"  No results found for: "HGBA1C"  Urinalysis    Component Value Date/Time   BILIRUBINUR neg 09/09/2019 1430   PROTEINUR Negative 09/09/2019 1430   UROBILINOGEN 0.2 09/09/2019 1430   NITRITE neg 09/09/2019 1430   LEUKOCYTESUR Moderate (2+) (A) 09/09/2019 1430    No results found for: "LABMICR", "WBCUA", "RBCUA", "LABEPIT", "MUCUS", "BACTERIA"  Pertinent Imaging:  No results found for this or any previous visit.  No results found for this or any previous visit.  No results found for this or any previous visit.  No results found for this or any previous visit.  No results found for this or any previous visit.  No results found for this or any previous visit.  No results found for this or any previous visit.  No results found for this or any previous visit.   Assessment &  Plan:    1. Urinary frequency -We will trial gemtesa '75mg'$  daily.  - Urinalysis, Routine w reflex microscopic - BLADDER SCAN AMB NON-IMAGING   No follow-ups on file.  Nicolette Bang, MD  [facility] [place]

## 2021-12-13 NOTE — Progress Notes (Signed)
post void residual=61 

## 2021-12-13 NOTE — Patient Instructions (Signed)

## 2021-12-15 ENCOUNTER — Ambulatory Visit: Payer: Medicare HMO | Admitting: Urology

## 2021-12-15 DIAGNOSIS — S8011XD Contusion of right lower leg, subsequent encounter: Secondary | ICD-10-CM | POA: Diagnosis not present

## 2021-12-15 DIAGNOSIS — Z23 Encounter for immunization: Secondary | ICD-10-CM | POA: Diagnosis not present

## 2021-12-20 ENCOUNTER — Telehealth: Payer: Self-pay | Admitting: Urology

## 2021-12-20 NOTE — Telephone Encounter (Signed)
Pt LMOM and wanted McKenzie to know the [place] is making her sick and she quit taking it.  She wanted to know if there was another med she could try.

## 2021-12-20 NOTE — Telephone Encounter (Signed)
See below Please advise on alternatives.

## 2021-12-21 DIAGNOSIS — N3000 Acute cystitis without hematuria: Secondary | ICD-10-CM | POA: Diagnosis not present

## 2021-12-21 DIAGNOSIS — R030 Elevated blood-pressure reading, without diagnosis of hypertension: Secondary | ICD-10-CM | POA: Diagnosis not present

## 2021-12-21 DIAGNOSIS — R350 Frequency of micturition: Secondary | ICD-10-CM | POA: Diagnosis not present

## 2021-12-21 DIAGNOSIS — Z6821 Body mass index (BMI) 21.0-21.9, adult: Secondary | ICD-10-CM | POA: Diagnosis not present

## 2021-12-26 NOTE — Telephone Encounter (Signed)
Patient denies taking Lisbeth Ply, patient states that [place] Bores is making her very sick. Patient also stated she has never taking Vesicare before. She will like to be put on a different OAB medication.Please advised

## 2021-12-28 ENCOUNTER — Encounter: Payer: Self-pay | Admitting: Physician Assistant

## 2021-12-28 ENCOUNTER — Other Ambulatory Visit: Payer: Self-pay | Admitting: Physician Assistant

## 2021-12-28 MED ORDER — SOLIFENACIN SUCCINATE 10 MG PO TABS: 10.0000 mg | ORAL_TABLET | Freq: Every day | ORAL | 2 refills | Status: DC

## 2021-12-28 NOTE — Telephone Encounter (Signed)
Left voicemail informing patient to return call.

## 2021-12-29 DIAGNOSIS — D519 Vitamin B12 deficiency anemia, unspecified: Secondary | ICD-10-CM | POA: Diagnosis not present

## 2022-01-01 NOTE — Telephone Encounter (Signed)
Unable to reach patient after several attempts.  Left detailed message informing patient.

## 2022-01-03 ENCOUNTER — Telehealth: Payer: Self-pay

## 2022-01-03 NOTE — Telephone Encounter (Signed)
Verbal from Dr. Alyson Ingles, patient is to discontinue Myrbetriq and should only be taking Vesicare at this time.  Returned call with no answer, left voicemail informing Dawn or MD response and instructed her to call back if anything further was needed.

## 2022-01-03 NOTE — Telephone Encounter (Signed)
[facility] needs clarification if she is supposed to be taking vesicare and myrbetriq?  Please advise.

## 2022-01-10 DIAGNOSIS — I10 Essential (primary) hypertension: Secondary | ICD-10-CM | POA: Diagnosis not present

## 2022-01-10 DIAGNOSIS — E039 Hypothyroidism, unspecified: Secondary | ICD-10-CM | POA: Diagnosis not present

## 2022-01-10 DIAGNOSIS — D692 Other nonthrombocytopenic purpura: Secondary | ICD-10-CM | POA: Diagnosis not present

## 2022-01-10 DIAGNOSIS — F1190 Opioid use, unspecified, uncomplicated: Secondary | ICD-10-CM | POA: Diagnosis not present

## 2022-01-10 DIAGNOSIS — E539 Vitamin B deficiency, unspecified: Secondary | ICD-10-CM | POA: Diagnosis not present

## 2022-01-10 DIAGNOSIS — E7849 Other hyperlipidemia: Secondary | ICD-10-CM | POA: Diagnosis not present

## 2022-01-10 DIAGNOSIS — K580 Irritable bowel syndrome with diarrhea: Secondary | ICD-10-CM | POA: Diagnosis not present

## 2022-01-10 DIAGNOSIS — M797 Fibromyalgia: Secondary | ICD-10-CM | POA: Diagnosis not present

## 2022-01-10 DIAGNOSIS — I270 Primary pulmonary hypertension: Secondary | ICD-10-CM | POA: Diagnosis not present

## 2022-01-12 DIAGNOSIS — E538 Deficiency of other specified B group vitamins: Secondary | ICD-10-CM | POA: Diagnosis not present

## 2022-01-24 ENCOUNTER — Ambulatory Visit: Payer: Medicare HMO | Admitting: Physician Assistant

## 2022-01-26 DIAGNOSIS — E538 Deficiency of other specified B group vitamins: Secondary | ICD-10-CM | POA: Diagnosis not present

## 2022-01-31 ENCOUNTER — Ambulatory Visit: Payer: Medicare HMO | Admitting: Physician Assistant

## 2022-01-31 VITALS — BP 149/75 | HR 75

## 2022-01-31 DIAGNOSIS — R350 Frequency of micturition: Secondary | ICD-10-CM | POA: Diagnosis not present

## 2022-01-31 DIAGNOSIS — N3021 Other chronic cystitis with hematuria: Secondary | ICD-10-CM | POA: Diagnosis not present

## 2022-01-31 DIAGNOSIS — N3941 Urge incontinence: Secondary | ICD-10-CM

## 2022-01-31 LAB — URINALYSIS, ROUTINE W REFLEX MICROSCOPIC
Bilirubin, UA: NEGATIVE
Glucose, UA: NEGATIVE
Ketones, UA: NEGATIVE
Leukocytes,UA: NEGATIVE
Nitrite, UA: NEGATIVE
Protein,UA: NEGATIVE
RBC, UA: NEGATIVE
Specific Gravity, UA: 1.015 (ref 1.005–1.030)
Urobilinogen, Ur: 0.2 mg/dL (ref 0.2–1.0)
pH, UA: 6 (ref 5.0–7.5)

## 2022-01-31 LAB — BLADDER SCAN AMB NON-IMAGING: Scan Result: 163

## 2022-01-31 MED ORDER — SOLIFENACIN SUCCINATE 10 MG PO TABS: 10.0000 mg | ORAL_TABLET | Freq: Every day | ORAL | 11 refills | Status: DC

## 2022-01-31 NOTE — Progress Notes (Signed)
Assessment: 1. Urinary frequency - Urinalysis, Routine w reflex microscopic - BLADDER SCAN AMB NON-IMAGING  2. Urge urinary incontinence  3. Chronic cystitis with hematuria    Plan: Vesicare refilled and pt given written note to remind her to take daily. FU in 6 months for UA and PVR. Pt may need cath urine.  The med list was reviewed with the patient she is unsure of which medications are active and which are not.  She is directly instructed to discontinue oxybutynin, Gemtesa, Myrbetriq, and only take the [employer].  Chief Complaint: No chief complaint on file.   HPI: Sabrina Morrison is a 88 y.o. female who presents for continued evaluation of urinary frequency and recurrent UTIs. No UTI sxs since last visit. Pt did not tolerate Gemtesa  and was verbally told to take Vesicare only via phone message. Pt states she is not taking Vesicare since she tried [place] No c/o today.  She denies frequency at this time.  She states she is doing well without any sxs of UTI.    UA= clear I&O cath for urine collection. Pt had no urge to void. 173 in bladder before cath.   12/13/21 Sabrina Morrison is a 88yo here for followup for urinary frequency and recurrent UTI. No UTI since last visit. She is currently on oxybutynin '5mg'$  daily and mirabegron '25mg'$  daily. She was previously on [place] '4mg'$  daily. She has urinary frequency every 1 hour. She has nocturia 0-3x. She denies straining to urinate. She has a strong urinary stream.   Portions of the above documentation were copied from a prior visit for review purposes only.  Allergies: Allergies  Allergen Reactions   Gemtesa [Vibegron] Nausea And Vomiting   Codeine Nausea And Vomiting   Coumadin [Warfarin]    Dexlansoprazole     REACTION: chest tightness, muscle aches, but tolerates prevacid   Erythromycin Ethylsuccinate     REACTION: left sided weakness   Esomeprazole Magnesium Nausea And Vomiting   Moxifloxacin Nausea And Vomiting   Omeprazole      REACTION: muscle ache/chest tightness   Sulfonamide Derivatives Nausea And Vomiting    PMH: Past Medical History:  Diagnosis Date   Allergic rhinitis    Allergy    Asthma    Cancer ([place])    skin cancer form left arm   Cataract    Fibromyalgia    Gastroparesis 08/2008   mild, 35% retention at 2 hours (normal is less than 30% at 2 hours)   GERD (gastroesophageal reflux disease)    Hiatal hernia 09/01/2004   large   Hyperplastic colon polyp    Hypothyroidism    IDA (iron deficiency anemia)    Irritable bowel syndrome    Muscle pain, fibromyalgia    PONV (postoperative nausea and vomiting)    Schatzki's ring    h/o 8 EGDs   Shortness of breath    Tubular adenoma     PSH: Past Surgical History:  Procedure Laterality Date   ABDOMINAL HYSTERECTOMY     APPENDECTOMY     ARM SKIN LESION BIOPSY / EXCISION     left arm   CATARACT EXTRACTION W/PHACO  08/27/2011   Procedure: CATARACT EXTRACTION PHACO AND INTRAOCULAR LENS PLACEMENT ([place]);  Surgeon: Tonny Branch, MD;  Location: [facility];  Service: Ophthalmology;  Laterality: Right;  CDE: 15.12   COLON SURGERY     COLONOSCOPY  09/01/2004   RMR: normal, due 08/2014   COLONOSCOPY WITH ESOPHAGOGASTRODUODENOSCOPY (EGD)  06/05/2012   Dr. Gala Romney- EGD- "  corkscrewed" tubular esophagus.  hiatal hernia beign bx. TCS- colonic divertiulosis, tubular adenoma, hyperplastic polyp   DILATION AND CURETTAGE OF UTERUS     x2   ESOPHAGOGASTRODUODENOSCOPY  8/08   RMR: cork-screwed esophagus,prominent Schatki's ring large hh   ESOPHAGOGASTRODUODENOSCOPY  04/18/10   prominent schatzki's ring/large hiatal hernia  otherwise normal stomach   ESOPHAGOGASTRODUODENOSCOPY   09/25/2002   RMR: Prominent Schatski's ring, foreshortened esophagus, moderate size hiatal hernia.  The remainder of the stomach and duodenum through the second portion appeared normal.  Status post balloon dilation of ring,   ESOPHAGOGASTRODUODENOSCOPY  01/28/2004   AOZ:[id number]'O ring status post  dilation as described above. Otherwise normal/ Large hiatal hernia   ESOPHAGOGASTRODUODENOSCOPY  09/01/2004   RMR: Schatzki's ring, otherwise normal esophagus status post dilation/Moderately large hiatal hernia   ESOPHAGOGASTRODUODENOSCOPY   12/14/2005   RMR: Schatzki's ring, otherwise normal esophagus status post dilation /Large hiatal hernia   EYE SURGERY     GIVENS CAPSULE STUDY  06/26/2012   Dr. Gala Romney- probable 2 superficial erosions in the mid small bowel, no evidence of tumor or blood anywhere in the small intestine.   S/P Hysterectomy     segmental colectomy     with incidental appendectomy in [place] Bilateral 9/62/9528   Procedure: YAG LASER APPLICATION;  Surgeon: Williams Che, MD;  Location: [facility];  Service: Ophthalmology;  Laterality: Bilateral;    SH: Social History   Tobacco Use   Smoking status: Never   Smokeless tobacco: Never  Vaping Use   Vaping Use: Never used  Substance Use Topics   Alcohol use: No   Drug use: No    ROS: All other review of systems were reviewed and are negative except what is noted above in HPI  PE: BP (!) 149/75   Pulse 75  GENERAL APPEARANCE:  Well appearing, well developed, thin, NAD HEENT:  Atraumatic, normocephalic NECK:  Supple. Trachea midline ABDOMEN:  Soft, non-tender, no masses EXTREMITIES:  Moves all extremities well, without clubbing, cyanosis, or edema NEUROLOGIC:  Alert and oriented.  MENTAL STATUS:  appropriate BACK:  Non-tender to palpation, No CVAT SKIN:  Warm, dry, and intact   Results: Laboratory Data: Lab Results  Component Value Date   WBC 6.0 08/24/2014   HGB 11.2 (L) 08/24/2014   HCT 34.3 (L) 08/24/2014   MCV 95.8 08/24/2014   PLT 313 08/24/2014    Lab Results  Component Value Date   CREATININE 0.89 08/21/2011     Urinalysis    Component Value Date/Time   BILIRUBINUR neg 09/09/2019 1430   PROTEINUR Negative 09/09/2019 1430   UROBILINOGEN 0.2 09/09/2019 1430    NITRITE neg 09/09/2019 1430   LEUKOCYTESUR Moderate (2+) (A) 09/09/2019 1430    No results found for: "LABMICR", "WBCUA", "RBCUA", "LABEPIT", "MUCUS", "BACTERIA"  Pertinent Imaging: No results found for this or any previous visit.  No results found for this or any previous visit.  No results found for this or any previous visit.  No results found for this or any previous visit.  No results found for this or any previous visit.  No results found for this or any previous visit.  No results found for this or any previous visit.  No results found for this or any previous visit.  No results found for this or any previous visit (from the past 24 hour(s)).

## 2022-01-31 NOTE — Progress Notes (Signed)
Pt here today for bladder scan. Bladder was scanned and 163 was visualized.   Pt is prepped for and in and out catherization. Patient was cleaned and prepped in a sterle fashion with betadine. A 14 fr catheter foley was inserted. Urine return was note 140 ml.  Performed by Marisue Brooklyn, CMA  Urine sent Lab  Performed by Marisue Brooklyn, CMA  Additional follow up Follow up as schedule

## 2022-02-01 DIAGNOSIS — K2100 Gastro-esophageal reflux disease with esophagitis, without bleeding: Secondary | ICD-10-CM | POA: Diagnosis not present

## 2022-02-01 DIAGNOSIS — E039 Hypothyroidism, unspecified: Secondary | ICD-10-CM | POA: Diagnosis not present

## 2022-02-09 DIAGNOSIS — E538 Deficiency of other specified B group vitamins: Secondary | ICD-10-CM | POA: Diagnosis not present

## 2022-02-19 DIAGNOSIS — E538 Deficiency of other specified B group vitamins: Secondary | ICD-10-CM | POA: Diagnosis not present

## 2022-02-19 DIAGNOSIS — R030 Elevated blood-pressure reading, without diagnosis of hypertension: Secondary | ICD-10-CM | POA: Diagnosis not present

## 2022-03-02 DIAGNOSIS — E538 Deficiency of other specified B group vitamins: Secondary | ICD-10-CM | POA: Diagnosis not present

## 2022-03-16 DIAGNOSIS — R030 Elevated blood-pressure reading, without diagnosis of hypertension: Secondary | ICD-10-CM | POA: Diagnosis not present

## 2022-03-16 DIAGNOSIS — E538 Deficiency of other specified B group vitamins: Secondary | ICD-10-CM | POA: Diagnosis not present

## 2022-03-19 DIAGNOSIS — D510 Vitamin B12 deficiency anemia due to intrinsic factor deficiency: Secondary | ICD-10-CM | POA: Diagnosis not present

## 2022-03-19 DIAGNOSIS — I10 Essential (primary) hypertension: Secondary | ICD-10-CM | POA: Diagnosis not present

## 2022-03-19 DIAGNOSIS — E7849 Other hyperlipidemia: Secondary | ICD-10-CM | POA: Diagnosis not present

## 2022-03-19 DIAGNOSIS — K529 Noninfective gastroenteritis and colitis, unspecified: Secondary | ICD-10-CM | POA: Diagnosis not present

## 2022-03-21 DIAGNOSIS — M797 Fibromyalgia: Secondary | ICD-10-CM | POA: Diagnosis not present

## 2022-03-21 DIAGNOSIS — I358 Other nonrheumatic aortic valve disorders: Secondary | ICD-10-CM | POA: Diagnosis not present

## 2022-03-21 DIAGNOSIS — M1612 Unilateral primary osteoarthritis, left hip: Secondary | ICD-10-CM | POA: Diagnosis not present

## 2022-03-21 DIAGNOSIS — E039 Hypothyroidism, unspecified: Secondary | ICD-10-CM | POA: Diagnosis not present

## 2022-03-21 DIAGNOSIS — D692 Other nonthrombocytopenic purpura: Secondary | ICD-10-CM | POA: Diagnosis not present

## 2022-03-21 DIAGNOSIS — E539 Vitamin B deficiency, unspecified: Secondary | ICD-10-CM | POA: Diagnosis not present

## 2022-03-21 DIAGNOSIS — E7849 Other hyperlipidemia: Secondary | ICD-10-CM | POA: Diagnosis not present

## 2022-03-21 DIAGNOSIS — K580 Irritable bowel syndrome with diarrhea: Secondary | ICD-10-CM | POA: Diagnosis not present

## 2022-03-21 DIAGNOSIS — I10 Essential (primary) hypertension: Secondary | ICD-10-CM | POA: Diagnosis not present

## 2022-03-30 DIAGNOSIS — E538 Deficiency of other specified B group vitamins: Secondary | ICD-10-CM | POA: Diagnosis not present

## 2022-04-11 DIAGNOSIS — K449 Diaphragmatic hernia without obstruction or gangrene: Secondary | ICD-10-CM | POA: Diagnosis not present

## 2022-04-11 DIAGNOSIS — N3941 Urge incontinence: Secondary | ICD-10-CM | POA: Diagnosis not present

## 2022-04-11 DIAGNOSIS — R059 Cough, unspecified: Secondary | ICD-10-CM | POA: Diagnosis not present

## 2022-04-11 DIAGNOSIS — Z6820 Body mass index (BMI) 20.0-20.9, adult: Secondary | ICD-10-CM | POA: Diagnosis not present

## 2022-04-11 DIAGNOSIS — M40204 Unspecified kyphosis, thoracic region: Secondary | ICD-10-CM | POA: Diagnosis not present

## 2022-04-11 DIAGNOSIS — R011 Cardiac murmur, unspecified: Secondary | ICD-10-CM | POA: Diagnosis not present

## 2022-04-11 DIAGNOSIS — N3281 Overactive bladder: Secondary | ICD-10-CM | POA: Diagnosis not present

## 2022-04-13 DIAGNOSIS — E538 Deficiency of other specified B group vitamins: Secondary | ICD-10-CM | POA: Diagnosis not present

## 2022-04-18 DIAGNOSIS — I5030 Unspecified diastolic (congestive) heart failure: Secondary | ICD-10-CM | POA: Diagnosis not present

## 2022-04-18 DIAGNOSIS — I272 Pulmonary hypertension, unspecified: Secondary | ICD-10-CM | POA: Diagnosis not present

## 2022-04-18 DIAGNOSIS — R011 Cardiac murmur, unspecified: Secondary | ICD-10-CM | POA: Diagnosis not present

## 2022-04-18 DIAGNOSIS — I083 Combined rheumatic disorders of mitral, aortic and tricuspid valves: Secondary | ICD-10-CM | POA: Diagnosis not present

## 2022-04-21 DIAGNOSIS — Z6821 Body mass index (BMI) 21.0-21.9, adult: Secondary | ICD-10-CM | POA: Diagnosis not present

## 2022-04-21 DIAGNOSIS — R030 Elevated blood-pressure reading, without diagnosis of hypertension: Secondary | ICD-10-CM | POA: Diagnosis not present

## 2022-04-21 DIAGNOSIS — N3090 Cystitis, unspecified without hematuria: Secondary | ICD-10-CM | POA: Diagnosis not present

## 2022-04-25 DIAGNOSIS — E538 Deficiency of other specified B group vitamins: Secondary | ICD-10-CM | POA: Diagnosis not present

## 2022-05-03 DIAGNOSIS — Z885 Allergy status to narcotic agent status: Secondary | ICD-10-CM | POA: Diagnosis not present

## 2022-05-03 DIAGNOSIS — Z882 Allergy status to sulfonamides status: Secondary | ICD-10-CM | POA: Diagnosis not present

## 2022-05-03 DIAGNOSIS — J449 Chronic obstructive pulmonary disease, unspecified: Secondary | ICD-10-CM | POA: Diagnosis not present

## 2022-05-03 DIAGNOSIS — E039 Hypothyroidism, unspecified: Secondary | ICD-10-CM | POA: Diagnosis not present

## 2022-05-03 DIAGNOSIS — R0689 Other abnormalities of breathing: Secondary | ICD-10-CM | POA: Diagnosis not present

## 2022-05-03 DIAGNOSIS — K2100 Gastro-esophageal reflux disease with esophagitis, without bleeding: Secondary | ICD-10-CM | POA: Diagnosis not present

## 2022-05-03 DIAGNOSIS — R110 Nausea: Secondary | ICD-10-CM | POA: Diagnosis not present

## 2022-05-03 DIAGNOSIS — Z880 Allergy status to penicillin: Secondary | ICD-10-CM | POA: Diagnosis not present

## 2022-05-03 DIAGNOSIS — R55 Syncope and collapse: Secondary | ICD-10-CM | POA: Diagnosis not present

## 2022-05-03 DIAGNOSIS — Z5329 Procedure and treatment not carried out because of patient's decision for other reasons: Secondary | ICD-10-CM | POA: Diagnosis not present

## 2022-05-03 DIAGNOSIS — I959 Hypotension, unspecified: Secondary | ICD-10-CM | POA: Diagnosis not present

## 2022-05-03 DIAGNOSIS — Z888 Allergy status to other drugs, medicaments and biological substances status: Secondary | ICD-10-CM | POA: Diagnosis not present

## 2022-05-08 DIAGNOSIS — Z6821 Body mass index (BMI) 21.0-21.9, adult: Secondary | ICD-10-CM | POA: Diagnosis not present

## 2022-05-08 DIAGNOSIS — R55 Syncope and collapse: Secondary | ICD-10-CM | POA: Diagnosis not present

## 2022-05-10 DIAGNOSIS — I10 Essential (primary) hypertension: Secondary | ICD-10-CM | POA: Diagnosis not present

## 2022-05-10 DIAGNOSIS — E538 Deficiency of other specified B group vitamins: Secondary | ICD-10-CM | POA: Diagnosis not present

## 2022-05-16 ENCOUNTER — Other Ambulatory Visit: Payer: Self-pay | Admitting: Allergy & Immunology

## 2022-05-17 ENCOUNTER — Ambulatory Visit: Payer: Medicare HMO | Attending: Internal Medicine | Admitting: Internal Medicine

## 2022-05-17 ENCOUNTER — Other Ambulatory Visit (INDEPENDENT_AMBULATORY_CARE_PROVIDER_SITE_OTHER): Payer: Medicare HMO

## 2022-05-17 ENCOUNTER — Other Ambulatory Visit: Payer: Self-pay | Admitting: Internal Medicine

## 2022-05-17 ENCOUNTER — Telehealth: Payer: Self-pay | Admitting: Internal Medicine

## 2022-05-17 ENCOUNTER — Encounter: Payer: Self-pay | Admitting: Internal Medicine

## 2022-05-17 VITALS — BP 190/70 | HR 70 | Ht 63.0 in | Wt 117.4 lb

## 2022-05-17 DIAGNOSIS — I350 Nonrheumatic aortic (valve) stenosis: Secondary | ICD-10-CM | POA: Diagnosis not present

## 2022-05-17 DIAGNOSIS — R55 Syncope and collapse: Secondary | ICD-10-CM

## 2022-05-17 DIAGNOSIS — I272 Pulmonary hypertension, unspecified: Secondary | ICD-10-CM | POA: Insufficient documentation

## 2022-05-17 NOTE — Telephone Encounter (Signed)
PERCERT:  14 Day ZIO AT dx: syncope

## 2022-05-17 NOTE — Patient Instructions (Addendum)
Medication Instructions:  Your physician recommends that you continue on your current medications as directed. Please refer to the Current Medication list given to you today.  Labwork: none  Testing/Procedures: Your physician has requested that you have an echocardiogram in 6 months (June 2024) just before your next visit. Echocardiography is a painless test that uses sound waves to create images of your heart. It provides your doctor with information about the size and shape of your heart and how well your heart's chambers and valves are working. This procedure takes approximately one hour. There are no restrictions for this procedure. Please do NOT wear cologne, perfume, aftershave, or lotions (deodorant is allowed). Please arrive 15 minutes prior to your appointment time. Your physician has recommended that you wear a Zio monitor.   This monitor is a medical device that records the heart's electrical activity. Doctors most often use these monitors to diagnose arrhythmias. Arrhythmias are problems with the speed or rhythm of the heartbeat. The monitor is a small device applied to your chest. You can wear one while you do your normal daily activities. While wearing this monitor if you have any symptoms to push the button and record what you felt. Once you have worn this monitor for the period of time provider prescribed (for 14 days), you will return the monitor device in the postage paid box. Once it is returned they will download the data collected and provide [place] with a report which the provider will then review and we will call you with those results. Important tips:  Avoid showering during the first 48 hours of wearing the monitor. Avoid excessive sweating to help maximize wear time. Do not submerge the device, no hot tubs, and no swimming pools. Keep any lotions or oils away from the patch. After 48 hours you may shower with the patch on. Take brief showers with your back facing the shower  head.  Do not remove patch once it has been placed because that will interrupt data and decrease adhesive wear time. Push the button when you have any symptoms and write down what you were feeling. Once you have completed wearing your monitor, remove and place into box which has postage paid and place in your outgoing mailbox.   Follow-Up: Your physician recommends that you schedule a follow-up appointment in: 6 months  Any Other Special Instructions Will Be Listed Below (If Applicable).  If you need a refill on your cardiac medications before your next appointment, please call your pharmacy.

## 2022-05-17 NOTE — Progress Notes (Signed)
Cardiology Office Note  Date: 05/17/2022   ID: Shontell, Prosser [birth year], MRN [id number]  PCP:  Caryl Bis, MD  Cardiologist:  Chalmers Guest, MD Electrophysiologist:  None   Reason for Office Visit: Syncope evaluation at the request of Dr. Quillian Quince   History of Present Illness: Sabrina Morrison is a 88 y.o. female known to have moderate AS was referred to cardiology clinic for evaluation of syncope at the request of Dr. Quillian Quince.  Patient usually skips breakfast in the morning and starts her day with lunch. In 04/2022, she did not eat or drink and went to [place] for grocery shopping. She did a lot of walking and finally was at the cashier waiting for her turn which was when she felt sick in her stomach, started to feel dizzy and the next minute she was found down.  Witnessed syncope and no postictal confusion. The [employer] employee called 911 and she was taken to [facility] ER. She eventually left AMA.  Echo was performed which showed normal LVEF, moderate aortic valve stenosis and severe pulmonary artery systolic pressures. She is physically active at baseline, has more than 4 METS and denied any DOE or angina or extreme fatigue. She denied having any dizziness/lightheadedness ever in her life until 11/23 [employer] episode.  Denied any prior ischemic evaluation.  No prior MI/PCI/CABG.  Denies smoking cigarettes, citric abuse and alcohol use.  No family history of premature ASCVD.  Past Medical History:  Diagnosis Date   Allergic rhinitis    Allergy    Asthma    Cancer ([place])    skin cancer form left arm   Cataract    Fibromyalgia    Gastroparesis 08/2008   mild, 35% retention at 2 hours (normal is less than 30% at 2 hours)   GERD (gastroesophageal reflux disease)    Hiatal hernia 09/01/2004   large   Hyperplastic colon polyp    Hypothyroidism    IDA (iron deficiency anemia)    Irritable bowel syndrome    Muscle pain, fibromyalgia    PONV (postoperative nausea and vomiting)     Schatzki's ring    h/o 8 EGDs   Shortness of breath    Tubular adenoma     Past Surgical History:  Procedure Laterality Date   ABDOMINAL HYSTERECTOMY     APPENDECTOMY     ARM SKIN LESION BIOPSY / EXCISION     left arm   CATARACT EXTRACTION W/PHACO  08/27/2011   Procedure: CATARACT EXTRACTION PHACO AND INTRAOCULAR LENS PLACEMENT ([place]);  Surgeon: Tonny Branch, MD;  Location: [facility];  Service: Ophthalmology;  Laterality: Right;  CDE: 15.12   COLON SURGERY     COLONOSCOPY  09/01/2004   RMR: normal, due 08/2014   COLONOSCOPY WITH ESOPHAGOGASTRODUODENOSCOPY (EGD)  06/05/2012   Dr. Gala Romney- EGD- "corkscrewed" tubular esophagus.  hiatal hernia beign bx. TCS- colonic divertiulosis, tubular adenoma, hyperplastic polyp   DILATION AND CURETTAGE OF UTERUS     x2   ESOPHAGOGASTRODUODENOSCOPY  8/08   RMR: cork-screwed esophagus,prominent Schatki's ring large hh   ESOPHAGOGASTRODUODENOSCOPY  04/18/10   prominent schatzki's ring/large hiatal hernia  otherwise normal stomach   ESOPHAGOGASTRODUODENOSCOPY   09/25/2002   RMR: Prominent Schatski's ring, foreshortened esophagus, moderate size hiatal hernia.  The remainder of the stomach and duodenum through the second portion appeared normal.  Status post balloon dilation of ring,   ESOPHAGOGASTRODUODENOSCOPY  01/28/2004   FYB:[id number]'[id number] ring status post dilation as described above. Otherwise normal/ Large hiatal hernia  ESOPHAGOGASTRODUODENOSCOPY  09/01/2004   RMR: Schatzki's ring, otherwise normal esophagus status post dilation/Moderately large hiatal hernia   ESOPHAGOGASTRODUODENOSCOPY   12/14/2005   RMR: Schatzki's ring, otherwise normal esophagus status post dilation /Large hiatal hernia   EYE SURGERY     GIVENS CAPSULE STUDY  06/26/2012   Dr. Gala Romney- probable 2 superficial erosions in the mid small bowel, no evidence of tumor or blood anywhere in the small intestine.   S/P Hysterectomy     segmental colectomy     with incidental appendectomy in  [place] Bilateral 9/38/1017   Procedure: YAG LASER APPLICATION;  Surgeon: Williams Che, MD;  Location: [facility];  Service: Ophthalmology;  Laterality: Bilateral;    Current Outpatient Medications  Medication Sig Dispense Refill   acetaminophen (TYLENOL) 500 MG tablet Take 1,000 mg by mouth in the morning and at bedtime. pain     Azelastine HCl 137 MCG/SPRAY SOLN USE TWO SPRAYS IN EACH NOSTRIL TWICE DAILY AS DIRECTED. 90 mL 0   Bismuth 262 MG CHEW Chew 2 tablets by mouth as needed. As needed     Carbinoxamine Maleate 4 MG TABS Take 1 tablet (4 mg total) by mouth in the morning and at bedtime. 60 tablet 5   cetirizine (ZYRTEC) 10 MG tablet Take 10 mg by mouth daily.     chlorpheniramine (CHLOR-TRIMETON) 4 MG tablet Take 1 tablet (4 mg total) by mouth 2 (two) times daily as needed for allergies. 60 tablet 5   hydrocortisone 2.5 % cream      ipratropium (ATROVENT) 0.03 % nasal spray USE 1 SPRAY IN EACH NOSTRIL THREE TIMES DAILY AS DIRECTED (MUST KEEP APPOINTMENT FOR FURTHER REFILLS) 60 mL 3   levothyroxine (SYNTHROID) 100 MCG tablet Take 100 mcg by mouth daily before breakfast. Name brand ONLY     promethazine (PHENERGAN) 12.5 MG tablet Take 12.5 mg by mouth every 8 (eight) hours as needed.      RABEprazole (ACIPHEX) 20 MG tablet Take 1 tablet (20 mg total) by mouth 2 (two) times daily before a meal. 180 tablet 3   solifenacin (VESICARE) 10 MG tablet Take 1 tablet (10 mg total) by mouth daily. 30 tablet 11   traMADol (ULTRAM) 50 MG tablet Take 50 mg by mouth as needed.      No current facility-administered medications for this visit.   Allergies:  Gemtesa [vibegron], Codeine, Coumadin [warfarin], Dexlansoprazole, Erythromycin ethylsuccinate, Esomeprazole magnesium, Moxifloxacin, Omeprazole, and Sulfonamide derivatives   Social History: The patient  reports that she has never smoked. She has never used smokeless tobacco. She reports that she does not drink alcohol and  does not use drugs.   Family History: The patient's family history includes Heart attack (age of onset: 73) in her father.   ROS:  Please see the history of present illness. Otherwise, complete review of systems is positive for none.  All other systems are reviewed and negative.   Physical Exam: VS:  Ht '5\' 3"'$  (1.6 m)   Wt 117 lb 6.4 oz (53.3 kg)   BMI 20.80 kg/m , BMI Body mass index is 20.8 kg/m.  Wt Readings from Last 3 Encounters:  05/17/22 117 lb 6.4 oz (53.3 kg)  07/19/21 117 lb 3.2 oz (53.2 kg)  09/20/20 126 lb 12.8 oz (57.5 kg)    General: Patient appears comfortable at rest. HEENT: Conjunctiva and lids normal, oropharynx clear with moist mucosa. Neck: Supple, no elevated JVP or carotid bruits, no thyromegaly. Lungs: Clear to auscultation,  nonlabored breathing at rest. Cardiac: Regular rate and rhythm, ejection systolic murmur, S2 is present. Abdomen: Soft, nontender, no hepatomegaly, bowel sounds present, no guarding or rebound. Extremities: No pitting edema, distal pulses 2+. Skin: Warm and dry. Musculoskeletal: No kyphosis. Neuropsychiatric: Alert and oriented x3, affect grossly appropriate.  ECG: NSR  Recent Labwork: No results found for requested labs within last 365 days.  No results found for: "CHOL", "TRIG", "HDL", "CHOLHDL", "VLDL", "LDLCALC", "LDLDIRECT"  Other Studies Reviewed Today: Echo from 04/2022 at [facility] LVEF preserved Moderate aortic valve stenosis Severe pulmonary hypertension  Assessment and Plan: Patient is a 88 year old F known to have moderate aortic valve stenosis and severe pulmonary hypertension was referred to cardiology clinic for evaluation of syncope.  # Syncope likely secondary to ?orthostatic hypotension -Patient did not have breakfast or p.o. water intake prior to [employer].  This might have led to the syncopal event. However cannot rule out underlying conduction abnormality or arrhythmias, if any. Obtain 1 week event  monitor.  -Echo from 11/23 showed moderate aortic valve stenosis. Physical examination is remarkable for an ejection systolic murmur with presence of S2 consistent with moderate aortic valve stenosis. I do not think her moderate aortic stenosis contributed to her syncopal event. -Echo from 11/23 showed severe pulmonary artery systolic pressure. Severe pulmonary hypertension can cause syncope however patient does not have any baseline SOB. She has more than 4 METS, lives independently and performs all her daily activities at home (including changing bed sheets, sweeping the floor, mopping/cleaning etc.) without any symptoms. So I do not think her pulmonary hypertension might have contributed to her syncopal event.  # Moderate aortic valve stenosis # Severe pulmonary hypertension, asymptomatic -Obtain 2D echocardiogram in 6 months  I have spent a total of 45 minutes with patient reviewing chart EKGs, labs and examining patient as well as establishing an assessment and plan that was discussed with the patient.  > 50% of time was spent in direct patient care.      Medication Adjustments/Labs and Tests Ordered: Current medicines are reviewed at length with the patient today.  Concerns regarding medicines are outlined above.   Tests Ordered: No orders of the defined types were placed in this encounter.   Medication Changes: No orders of the defined types were placed in this encounter.   Disposition:  Follow up  6 months  Signed Susan Arana Fidel Levy, MD, 05/17/2022 10:59 AM    [place] [facility] [place], [place],  [id number]

## 2022-05-18 DIAGNOSIS — R55 Syncope and collapse: Secondary | ICD-10-CM | POA: Diagnosis not present

## 2022-05-24 DIAGNOSIS — E538 Deficiency of other specified B group vitamins: Secondary | ICD-10-CM | POA: Diagnosis not present

## 2022-05-31 ENCOUNTER — Telehealth: Payer: Self-pay | Admitting: Internal Medicine

## 2022-05-31 NOTE — Telephone Encounter (Signed)
Caryl Pina from [employer] called on behalf of pt. She stated that it is time for pt to remove her heart monitor however pt's skin is coming off with the heart monitor and wants to know if she can sch maybe a RN visit to come have it removed.   Best c/b for pt: [phone]

## 2022-05-31 NOTE — Telephone Encounter (Signed)
No answer, no machine.

## 2022-06-01 NOTE — Telephone Encounter (Signed)
Patient took monitor off at home.   She will look for the box that came with it to mail back

## 2022-06-03 DIAGNOSIS — K2100 Gastro-esophageal reflux disease with esophagitis, without bleeding: Secondary | ICD-10-CM | POA: Diagnosis not present

## 2022-06-03 DIAGNOSIS — E039 Hypothyroidism, unspecified: Secondary | ICD-10-CM | POA: Diagnosis not present

## 2022-06-07 DIAGNOSIS — E538 Deficiency of other specified B group vitamins: Secondary | ICD-10-CM | POA: Diagnosis not present

## 2022-06-21 DIAGNOSIS — E538 Deficiency of other specified B group vitamins: Secondary | ICD-10-CM | POA: Diagnosis not present

## 2022-06-28 DIAGNOSIS — J189 Pneumonia, unspecified organism: Secondary | ICD-10-CM | POA: Diagnosis not present

## 2022-06-28 DIAGNOSIS — R300 Dysuria: Secondary | ICD-10-CM | POA: Diagnosis not present

## 2022-06-28 DIAGNOSIS — J479 Bronchiectasis, uncomplicated: Secondary | ICD-10-CM | POA: Diagnosis not present

## 2022-06-28 DIAGNOSIS — R350 Frequency of micturition: Secondary | ICD-10-CM | POA: Diagnosis not present

## 2022-06-28 DIAGNOSIS — R053 Chronic cough: Secondary | ICD-10-CM | POA: Diagnosis not present

## 2022-06-28 DIAGNOSIS — M40204 Unspecified kyphosis, thoracic region: Secondary | ICD-10-CM | POA: Diagnosis not present

## 2022-07-04 DIAGNOSIS — F1190 Opioid use, unspecified, uncomplicated: Secondary | ICD-10-CM | POA: Diagnosis not present

## 2022-07-04 DIAGNOSIS — E039 Hypothyroidism, unspecified: Secondary | ICD-10-CM | POA: Diagnosis not present

## 2022-07-04 DIAGNOSIS — M1612 Unilateral primary osteoarthritis, left hip: Secondary | ICD-10-CM | POA: Diagnosis not present

## 2022-07-04 DIAGNOSIS — M797 Fibromyalgia: Secondary | ICD-10-CM | POA: Diagnosis not present

## 2022-07-04 DIAGNOSIS — E539 Vitamin B deficiency, unspecified: Secondary | ICD-10-CM | POA: Diagnosis not present

## 2022-07-04 DIAGNOSIS — E7849 Other hyperlipidemia: Secondary | ICD-10-CM | POA: Diagnosis not present

## 2022-07-04 DIAGNOSIS — I10 Essential (primary) hypertension: Secondary | ICD-10-CM | POA: Diagnosis not present

## 2022-07-04 DIAGNOSIS — K580 Irritable bowel syndrome with diarrhea: Secondary | ICD-10-CM | POA: Diagnosis not present

## 2022-07-04 DIAGNOSIS — K2100 Gastro-esophageal reflux disease with esophagitis, without bleeding: Secondary | ICD-10-CM | POA: Diagnosis not present

## 2022-07-05 DIAGNOSIS — E538 Deficiency of other specified B group vitamins: Secondary | ICD-10-CM | POA: Diagnosis not present

## 2022-07-19 DIAGNOSIS — E538 Deficiency of other specified B group vitamins: Secondary | ICD-10-CM | POA: Diagnosis not present

## 2022-07-19 DIAGNOSIS — I10 Essential (primary) hypertension: Secondary | ICD-10-CM | POA: Diagnosis not present

## 2022-07-20 ENCOUNTER — Telehealth: Payer: Self-pay | Admitting: *Deleted

## 2022-07-20 DIAGNOSIS — R29818 Other symptoms and signs involving the nervous system: Secondary | ICD-10-CM

## 2022-07-20 MED ORDER — METOPROLOL TARTRATE 25 MG PO TABS: 12.5000 mg | ORAL_TABLET | Freq: Two times a day (BID) | ORAL | 3 refills | Status: DC

## 2022-07-20 NOTE — Telephone Encounter (Signed)
Patient informed and verbalized understanding of plan. Copy sent to PCP 

## 2022-07-20 NOTE — Telephone Encounter (Signed)
-----   Message from Chalmers Guest, MD sent at 07/20/2022  8:12 AM EST ----- PAC burden is 6.5%. Start Metoprolol tartarate 12.5 mg BID. There are two brief episodes of Mobitz Type II AV block noted during sleep, 4:16 am and no high grade AV block noted during wakeful hours. Referral for OSA evaluation.

## 2022-07-23 ENCOUNTER — Ambulatory Visit: Payer: Medicare HMO | Admitting: Physician Assistant

## 2022-07-31 DIAGNOSIS — R300 Dysuria: Secondary | ICD-10-CM | POA: Diagnosis not present

## 2022-08-02 DIAGNOSIS — E538 Deficiency of other specified B group vitamins: Secondary | ICD-10-CM | POA: Diagnosis not present

## 2022-08-02 DIAGNOSIS — J45909 Unspecified asthma, uncomplicated: Secondary | ICD-10-CM | POA: Diagnosis not present

## 2022-08-02 DIAGNOSIS — E039 Hypothyroidism, unspecified: Secondary | ICD-10-CM | POA: Diagnosis not present

## 2022-08-07 DIAGNOSIS — R350 Frequency of micturition: Secondary | ICD-10-CM | POA: Diagnosis not present

## 2022-08-15 DIAGNOSIS — E538 Deficiency of other specified B group vitamins: Secondary | ICD-10-CM | POA: Diagnosis not present

## 2022-09-03 DEATH — deceased

## 2022-11-19 ENCOUNTER — Other Ambulatory Visit: Payer: Medicare HMO

## 2022-11-22 ENCOUNTER — Ambulatory Visit: Payer: Medicare HMO | Admitting: Internal Medicine
